# Patient Record
Sex: Male | Born: 1945
Health system: Southern US, Community
[De-identification: ages and names within clinical notes are randomized; demographics above are authoritative.]

## PROBLEM LIST (undated history)

## (undated) DIAGNOSIS — I480 Paroxysmal atrial fibrillation: Secondary | ICD-10-CM

## (undated) DIAGNOSIS — C801 Malignant (primary) neoplasm, unspecified: Secondary | ICD-10-CM

## (undated) DIAGNOSIS — M1990 Unspecified osteoarthritis, unspecified site: Secondary | ICD-10-CM

## (undated) DIAGNOSIS — I10 Essential (primary) hypertension: Secondary | ICD-10-CM

## (undated) DIAGNOSIS — F32A Depression, unspecified: Secondary | ICD-10-CM

## (undated) DIAGNOSIS — F329 Major depressive disorder, single episode, unspecified: Secondary | ICD-10-CM

## (undated) DIAGNOSIS — Z950 Presence of cardiac pacemaker: Secondary | ICD-10-CM

## (undated) DIAGNOSIS — I495 Sick sinus syndrome: Secondary | ICD-10-CM

## (undated) HISTORY — PX: TONSILLECTOMY: SUR1361

## (undated) HISTORY — DX: Paroxysmal atrial fibrillation: I48.0

## (undated) HISTORY — PX: FINGER SURGERY: SHX640

## (undated) HISTORY — DX: Presence of cardiac pacemaker: Z95.0

## (undated) HISTORY — DX: Essential (primary) hypertension: I10

## (undated) HISTORY — DX: Sick sinus syndrome: I49.5

---

## 1999-04-17 ENCOUNTER — Emergency Department (HOSPITAL_COMMUNITY): Admission: EM | Admit: 1999-04-17 | Discharge: 1999-04-17 | Payer: Self-pay | Admitting: Emergency Medicine

## 1999-04-17 ENCOUNTER — Encounter: Payer: Self-pay | Admitting: Emergency Medicine

## 1999-04-20 ENCOUNTER — Emergency Department (HOSPITAL_COMMUNITY): Admission: EM | Admit: 1999-04-20 | Discharge: 1999-04-20 | Payer: Self-pay | Admitting: Emergency Medicine

## 1999-04-27 ENCOUNTER — Emergency Department (HOSPITAL_COMMUNITY): Admission: EM | Admit: 1999-04-27 | Discharge: 1999-04-27 | Payer: Self-pay | Admitting: Emergency Medicine

## 2007-10-03 ENCOUNTER — Emergency Department (HOSPITAL_COMMUNITY): Admission: EM | Admit: 2007-10-03 | Discharge: 2007-10-03 | Payer: Self-pay | Admitting: Emergency Medicine

## 2008-04-07 ENCOUNTER — Ambulatory Visit (HOSPITAL_COMMUNITY): Payer: Self-pay | Admitting: Psychiatry

## 2008-04-21 ENCOUNTER — Ambulatory Visit (HOSPITAL_COMMUNITY): Payer: Self-pay | Admitting: Psychiatry

## 2009-03-23 ENCOUNTER — Inpatient Hospital Stay (HOSPITAL_COMMUNITY): Admission: RE | Admit: 2009-03-23 | Discharge: 2009-03-26 | Payer: Self-pay | Admitting: Orthopedic Surgery

## 2009-03-23 HISTORY — PX: REPLACEMENT TOTAL KNEE: SUR1224

## 2009-04-05 ENCOUNTER — Emergency Department (HOSPITAL_BASED_OUTPATIENT_CLINIC_OR_DEPARTMENT_OTHER): Admission: EM | Admit: 2009-04-05 | Discharge: 2009-04-05 | Payer: Self-pay | Admitting: Emergency Medicine

## 2009-04-05 ENCOUNTER — Ambulatory Visit: Payer: Self-pay | Admitting: Diagnostic Radiology

## 2009-04-06 ENCOUNTER — Inpatient Hospital Stay (HOSPITAL_COMMUNITY): Admission: EM | Admit: 2009-04-06 | Discharge: 2009-04-07 | Payer: Self-pay | Admitting: Emergency Medicine

## 2010-05-14 ENCOUNTER — Encounter
Admission: RE | Admit: 2010-05-14 | Discharge: 2010-05-14 | Payer: Self-pay | Source: Home / Self Care | Attending: Internal Medicine | Admitting: Internal Medicine

## 2010-09-01 LAB — CBC
MCHC: 34.5 g/dL (ref 30.0–36.0)
MCV: 95.6 fL (ref 78.0–100.0)
Platelets: 356 10*3/uL (ref 150–400)
Platelets: 400 10*3/uL (ref 150–400)
RBC: 3.29 MIL/uL — ABNORMAL LOW (ref 4.22–5.81)
RDW: 12.3 % (ref 11.5–15.5)
WBC: 9.5 10*3/uL (ref 4.0–10.5)
WBC: 12.5 10*3/uL — ABNORMAL HIGH (ref 4.0–10.5)

## 2010-09-01 LAB — SYNOVIAL CELL COUNT + DIFF, W/ CRYSTALS
Crystals, Fluid: NONE SEEN
Eosinophils-Synovial: 0 % (ref 0–1)
Lymphocytes-Synovial Fld: 8 % (ref 0–20)
Monocyte-Macrophage-Synovial Fluid: 9 % — ABNORMAL LOW (ref 50–90)
Neutrophil, Synovial: 83 % — ABNORMAL HIGH (ref 0–25)
Other Cells-SYN: 0
WBC, Synovial: 1155 /mm3 — ABNORMAL HIGH (ref 0–200)

## 2010-09-01 LAB — BODY FLUID CULTURE
Culture: NO GROWTH
Gram Stain: NONE SEEN

## 2010-09-01 LAB — DIFFERENTIAL
Eosinophils Relative: 1 % (ref 0–5)
Lymphocytes Relative: 4 % — ABNORMAL LOW (ref 12–46)
Lymphs Abs: 0.5 10*3/uL — ABNORMAL LOW (ref 0.7–4.0)
Monocytes Absolute: 0.4 10*3/uL (ref 0.1–1.0)
Monocytes Relative: 3 % (ref 3–12)
Neutro Abs: 11.3 10*3/uL — ABNORMAL HIGH (ref 1.7–7.7)

## 2010-09-01 LAB — URINALYSIS, ROUTINE W REFLEX MICROSCOPIC
Bilirubin Urine: NEGATIVE
Glucose, UA: NEGATIVE mg/dL
Hgb urine dipstick: NEGATIVE
Ketones, ur: NEGATIVE mg/dL
Nitrite: NEGATIVE
Specific Gravity, Urine: 1.017 (ref 1.005–1.030)
pH: 7 (ref 5.0–8.0)

## 2010-09-01 LAB — URINE CULTURE
Colony Count: NO GROWTH
Culture: NO GROWTH

## 2010-09-01 LAB — COMPREHENSIVE METABOLIC PANEL
AST: 23 U/L (ref 0–37)
Albumin: 3.7 g/dL (ref 3.5–5.2)
BUN: 26 mg/dL — ABNORMAL HIGH (ref 6–23)
Calcium: 8.7 mg/dL (ref 8.4–10.5)
Chloride: 97 mEq/L (ref 96–112)
Creatinine, Ser: 1.1 mg/dL (ref 0.4–1.5)
GFR calc Af Amer: >60 mL/min (ref >60)
Total Bilirubin: 0.8 mg/dL (ref 0.3–1.2)
Total Protein: 7.4 g/dL (ref 6.0–8.3)

## 2010-09-01 LAB — CULTURE, BLOOD (ROUTINE X 2)
Culture: NO GROWTH
Culture: NO GROWTH

## 2010-09-01 LAB — WOUND CULTURE: Gram Stain: NONE SEEN

## 2010-09-01 LAB — PROTIME-INR
INR: 1.54 — ABNORMAL HIGH (ref 0.00–1.49)
Prothrombin Time: 18.4 seconds — ABNORMAL HIGH (ref 11.6–15.2)
Prothrombin Time: 20.3 seconds — ABNORMAL HIGH (ref 11.6–15.2)

## 2010-09-01 LAB — LACTIC ACID, PLASMA: Lactic Acid, Venous: 0.9 mmol/L (ref 0.5–2.2)

## 2010-09-02 LAB — CBC
HCT: 29.5 % — ABNORMAL LOW (ref 39.0–52.0)
HCT: 45.1 % (ref 39.0–52.0)
Hemoglobin: 10.2 g/dL — ABNORMAL LOW (ref 13.0–17.0)
Hemoglobin: 12.5 g/dL — ABNORMAL LOW (ref 13.0–17.0)
MCHC: 34.2 g/dL (ref 30.0–36.0)
MCHC: 34.3 g/dL (ref 30.0–36.0)
MCHC: 34.5 g/dL (ref 30.0–36.0)
MCV: 96.3 fL (ref 78.0–100.0)
Platelets: 191 10*3/uL (ref 150–400)
Platelets: 202 10*3/uL (ref 150–400)
Platelets: 214 10*3/uL (ref 150–400)
RBC: 3.02 MIL/uL — ABNORMAL LOW (ref 4.22–5.81)
RDW: 12.6 % (ref 11.5–15.5)
RDW: 12.7 % (ref 11.5–15.5)
RDW: 12.8 % (ref 11.5–15.5)
RDW: 13 % (ref 11.5–15.5)
WBC: 7 10*3/uL (ref 4.0–10.5)

## 2010-09-02 LAB — BASIC METABOLIC PANEL
BUN: 11 mg/dL (ref 6–23)
BUN: 22 mg/dL (ref 6–23)
BUN: 25 mg/dL — ABNORMAL HIGH (ref 6–23)
CO2: 29 mEq/L (ref 19–32)
CO2: 30 mEq/L (ref 19–32)
CO2: 34 mEq/L — ABNORMAL HIGH (ref 19–32)
Calcium: 7.8 mg/dL — ABNORMAL LOW (ref 8.4–10.5)
Calcium: 7.8 mg/dL — ABNORMAL LOW (ref 8.4–10.5)
Calcium: 8 mg/dL — ABNORMAL LOW (ref 8.4–10.5)
Chloride: 96 mEq/L (ref 96–112)
Chloride: 98 mEq/L (ref 96–112)
Creatinine, Ser: 1.22 mg/dL (ref 0.4–1.5)
Creatinine, Ser: 2.05 mg/dL — ABNORMAL HIGH (ref 0.4–1.5)
Creatinine, Ser: 2.18 mg/dL — ABNORMAL HIGH (ref 0.4–1.5)
GFR calc Af Amer: >60 mL/min (ref >60)
GFR calc non Af Amer: 31 mL/min — ABNORMAL LOW (ref >60)
GFR calc non Af Amer: 60 mL/min — ABNORMAL LOW (ref >60)
GFR calc non Af Amer: >60 mL/min (ref >60)
Glucose, Bld: 126 mg/dL — ABNORMAL HIGH (ref 70–99)
Glucose, Bld: 152 mg/dL — ABNORMAL HIGH (ref 70–99)
Potassium: 3.3 mEq/L — ABNORMAL LOW (ref 3.5–5.1)
Sodium: 135 mEq/L (ref 135–145)

## 2010-09-02 LAB — URINALYSIS, ROUTINE W REFLEX MICROSCOPIC
Bilirubin Urine: NEGATIVE
Ketones, ur: NEGATIVE mg/dL
Nitrite: NEGATIVE
Protein, ur: NEGATIVE mg/dL
Urobilinogen, UA: 0.2 mg/dL (ref 0.0–1.0)

## 2010-09-02 LAB — APTT: aPTT: 29 seconds (ref 24–37)

## 2010-09-02 LAB — PROTIME-INR
INR: 0.98 (ref 0.00–1.49)
INR: 1.25 (ref 0.00–1.49)
Prothrombin Time: 15.6 seconds — ABNORMAL HIGH (ref 11.6–15.2)
Prothrombin Time: 22.1 seconds — ABNORMAL HIGH (ref 11.6–15.2)

## 2010-09-02 LAB — COMPREHENSIVE METABOLIC PANEL
AST: 21 U/L (ref 0–37)
Albumin: 3.8 g/dL (ref 3.5–5.2)
BUN: 11 mg/dL (ref 6–23)
Chloride: 99 mEq/L (ref 96–112)
Creatinine, Ser: 0.78 mg/dL (ref 0.4–1.5)
GFR calc Af Amer: >60 mL/min (ref >60)
Potassium: 3.9 mEq/L (ref 3.5–5.1)
Total Protein: 8 g/dL (ref 6.0–8.3)

## 2010-09-02 LAB — TYPE AND SCREEN: Antibody Screen: NEGATIVE

## 2011-11-01 HISTORY — PX: US ECHOCARDIOGRAPHY: HXRAD669

## 2011-11-11 ENCOUNTER — Other Ambulatory Visit: Payer: Self-pay | Admitting: Cardiovascular Disease

## 2011-11-28 DIAGNOSIS — Z950 Presence of cardiac pacemaker: Secondary | ICD-10-CM

## 2011-11-28 HISTORY — DX: Presence of cardiac pacemaker: Z95.0

## 2011-12-04 MED ORDER — SODIUM CHLORIDE 0.9 % IR SOLN
80.0000 mg | Status: DC
  Filled 2011-12-04: qty 2

## 2011-12-04 MED ORDER — VANCOMYCIN HCL 1000 MG IV SOLR
1500.0000 mg | INTRAVENOUS | Status: DC
  Filled 2011-12-04 (×2): qty 1500

## 2011-12-04 MED ORDER — SODIUM CHLORIDE 0.45 % IV SOLN
INTRAVENOUS | Status: DC
  Administered 2011-12-05: 08:00:00 via INTRAVENOUS

## 2011-12-04 MED ORDER — SODIUM CHLORIDE 0.9 % IJ SOLN: 3.0000 mL | INTRAMUSCULAR | Status: DC | PRN

## 2011-12-05 ENCOUNTER — Ambulatory Visit (HOSPITAL_COMMUNITY)
Admission: RE | Admit: 2011-12-05 | Discharge: 2011-12-06 | Disposition: A | Discharge status: Home / Self Care | Payer: Medicare Other | Source: Ambulatory Visit | Attending: Cardiovascular Disease | Admitting: Cardiovascular Disease

## 2011-12-05 ENCOUNTER — Ambulatory Visit (HOSPITAL_COMMUNITY): Payer: Medicare Other

## 2011-12-05 ENCOUNTER — Encounter (HOSPITAL_COMMUNITY): Payer: Self-pay | Admitting: Cardiology

## 2011-12-05 ENCOUNTER — Encounter (HOSPITAL_COMMUNITY)
Admission: RE | Disposition: A | Discharge status: Home / Self Care | Payer: Self-pay | Source: Ambulatory Visit | Attending: Cardiovascular Disease

## 2011-12-05 DIAGNOSIS — R001 Bradycardia, unspecified: Secondary | ICD-10-CM | POA: Diagnosis present

## 2011-12-05 DIAGNOSIS — I10 Essential (primary) hypertension: Secondary | ICD-10-CM | POA: Diagnosis present

## 2011-12-05 DIAGNOSIS — I495 Sick sinus syndrome: Secondary | ICD-10-CM | POA: Insufficient documentation

## 2011-12-05 DIAGNOSIS — Z950 Presence of cardiac pacemaker: Secondary | ICD-10-CM | POA: Diagnosis present

## 2011-12-05 DIAGNOSIS — T82191A Other mechanical complication of cardiac pulse generator (battery), initial encounter: Secondary | ICD-10-CM | POA: Diagnosis present

## 2011-12-05 HISTORY — PX: PERMANENT PACEMAKER INSERTION: SHX6023

## 2011-12-05 HISTORY — PX: PERMANENT PACEMAKER INSERTION: SHX5480

## 2011-12-05 SURGERY — PERMANENT PACEMAKER INSERTION
Anesthesia: LOCAL

## 2011-12-05 MED ORDER — BENAZEPRIL HCL 20 MG PO TABS
20.0000 mg | ORAL_TABLET | Freq: Every day | ORAL | Status: DC
  Administered 2011-12-06: 20 mg via ORAL
  Filled 2011-12-05: qty 1

## 2011-12-05 MED ORDER — VANCOMYCIN HCL IN DEXTROSE 1-5 GM/200ML-% IV SOLN
1000.0000 mg | Freq: Two times a day (BID) | INTRAVENOUS | Status: AC
Start: 2011-12-05 — End: 2011-12-05
  Administered 2011-12-05: 1000 mg via INTRAVENOUS
  Filled 2011-12-05: qty 200

## 2011-12-05 MED ORDER — LIDOCAINE HCL (PF) 1 % IJ SOLN
INTRAMUSCULAR | Status: AC
  Filled 2011-12-05: qty 60

## 2011-12-05 MED ORDER — MIDAZOLAM HCL 2 MG/2ML IJ SOLN
INTRAMUSCULAR | Status: AC
  Filled 2011-12-05: qty 2

## 2011-12-05 MED ORDER — ALPRAZOLAM 0.25 MG PO TABS: 0.2500 mg | ORAL_TABLET | Freq: Three times a day (TID) | ORAL | Status: DC | PRN

## 2011-12-05 MED ORDER — ADULT MULTIVITAMIN W/MINERALS CH
1.0000 | ORAL_TABLET | Freq: Every day | ORAL | Status: DC
  Administered 2011-12-06: 1 via ORAL
  Filled 2011-12-05: qty 1

## 2011-12-05 MED ORDER — ONDANSETRON HCL 4 MG/2ML IJ SOLN: 4.0000 mg | Freq: Four times a day (QID) | INTRAMUSCULAR | Status: DC | PRN

## 2011-12-05 MED ORDER — TRAMADOL HCL 50 MG PO TABS: 50.0000 mg | ORAL_TABLET | Freq: Four times a day (QID) | ORAL | Status: DC | PRN

## 2011-12-05 MED ORDER — CHLORHEXIDINE GLUCONATE 4 % EX LIQD
60.0000 mL | Freq: Once | CUTANEOUS | Status: DC
  Filled 2011-12-05: qty 60

## 2011-12-05 MED ORDER — ACETAMINOPHEN 325 MG PO TABS: 325.0000 mg | ORAL_TABLET | ORAL | Status: DC | PRN

## 2011-12-05 MED ORDER — ZOLPIDEM TARTRATE 5 MG PO TABS: 5.0000 mg | ORAL_TABLET | Freq: Every evening | ORAL | Status: DC | PRN

## 2011-12-05 MED ORDER — HYDROCHLOROTHIAZIDE 25 MG PO TABS
25.0000 mg | ORAL_TABLET | Freq: Every day | ORAL | Status: DC
  Administered 2011-12-06: 25 mg via ORAL
  Filled 2011-12-05: qty 1

## 2011-12-05 MED ORDER — MUPIROCIN 2 % EX OINT
TOPICAL_OINTMENT | Freq: Two times a day (BID) | CUTANEOUS | Status: DC
  Administered 2011-12-05 (×2): via NASAL
  Filled 2011-12-05: qty 22

## 2011-12-05 MED ORDER — FENTANYL CITRATE 0.05 MG/ML IJ SOLN
INTRAMUSCULAR | Status: AC
  Filled 2011-12-05: qty 2

## 2011-12-05 MED ORDER — SERTRALINE HCL 50 MG PO TABS
150.0000 mg | ORAL_TABLET | Freq: Every day | ORAL | Status: DC
  Administered 2011-12-06: 150 mg via ORAL
  Filled 2011-12-05: qty 1

## 2011-12-05 MED ORDER — MUPIROCIN 2 % EX OINT
TOPICAL_OINTMENT | CUTANEOUS | Status: AC
  Filled 2011-12-05: qty 22

## 2011-12-05 MED ORDER — HYDROCODONE-ACETAMINOPHEN 5-325 MG PO TABS
1.0000 | ORAL_TABLET | ORAL | Status: DC | PRN
  Administered 2011-12-05: 2 via ORAL
  Administered 2011-12-05: 1 via ORAL
  Administered 2011-12-06: 2 via ORAL
  Filled 2011-12-05: qty 1
  Filled 2011-12-05 (×2): qty 2

## 2011-12-05 MED ORDER — BUPROPION HCL ER (SR) 150 MG PO TB12
150.0000 mg | ORAL_TABLET | Freq: Two times a day (BID) | ORAL | Status: DC
  Administered 2011-12-05 – 2011-12-06 (×2): 150 mg via ORAL
  Filled 2011-12-05 (×3): qty 1

## 2011-12-05 MED ORDER — HEPARIN (PORCINE) IN NACL 2-0.9 UNIT/ML-% IJ SOLN
INTRAMUSCULAR | Status: AC
  Filled 2011-12-05: qty 1000

## 2011-12-05 MED ORDER — VANCOMYCIN HCL IN DEXTROSE 1-5 GM/200ML-% IV SOLN
INTRAVENOUS | Status: AC
  Filled 2011-12-05: qty 200

## 2011-12-05 NOTE — Progress Notes (Signed)
Received patient with no VTE order. Called MD for clarification but didn't received call back. Patient will go home tomorrow. Will continue to follow.

## 2011-12-05 NOTE — CV Procedure (Signed)
Piggott,George Wolf Male, 66 y.o., [birth year]  Location: [facility] Bed: NONE  MRN: [id number]  CSN: [id number]   Procedure report  Procedure performed: 1. Implantation of new dual chamber permanent pacemaker 2. Fluoroscopy 3.  Light sedation  Reason for procedure: Symptomatic bradycardia due to: Sinus node dysfunction Chronotropic incompetence  Procedure performed by: Thurmon Fair, MD  Complications: None  Estimated blood loss: <10 mL  Medications administered during procedure: Vancomycin 1 g intravenously Lidocaine 1% 30 mL locally,  Fentanyl 50 mcg intravenously Versed 3 mg intravenously  Device details:  [employer] model Advantio  [id number] serial [phone] Right atrial lead guidant dextrus 4136 serial number [id number] Right ventricular lead guidant dextrus 4137 serial number [id number]  Procedure details:  After the risks and benefits of the procedure were discussed the patient provided informed consent and was brought to the cardiac cath lab in the fasting state. The patient was prepped and draped in usual sterile fashion. Local anesthesia with 1% lidocaine was administered to to the left infraclavicular area. A 5-6 cm horizontal incision was made parallel with and 2-3 cm caudal to the left clavicle. Using electrocautery and blunt dissection a prepectoral pocket was created down to the level of the pectoralis major muscle fascia. The pocket was carefully inspected for hemostasis. An antibiotic-soaked sponge was placed in the pocket.  Under fluoroscopic guidance and using the modified Seldinger technique 2 separate venipunctures were performed to access the left subclavian vein. no difficulty was encountered accessing the vein.  Two Wolf-tip guidewires were subsequently exchanged for two 7 French safe sheaths.  Under fluoroscopic guidance the ventricular lead was advanced to level of the mid to apical right ventricular septum and thet active-fixation helix was  deployed. 3 different locations were used until good sensing pacing and current of injury were noted. Prominent current of injury was seen. There was no evidence of diaphragmatic stimulation at maximum device output. The safe sheath was peeled away and the lead was secured in place with 2-0 silk.  In similar fashion the right atrial lead was advanced to the level of the atrial appendage. The active-fixation helix was deployed. There was prominent current of injury. Satisfactory  pacing and sensing parameters were recorded. There was no evidence of diaphragmatic stimulation with pacing at maximum device output. The safe sheath was peeled away and the lead was secured in place with 2-0 silk.  The antibiotic-soaked sponge was removed from the pocket. The pocket was flushed with copious amounts of antibiotic solution. Reinspection showed excellent hemostasis..  The ventricular lead was connected to the generator and appropriate ventricular pacing was seen. Subsequently the atrial lead was also connected. Repeat testing of the lead parameters later showed excellent values.  The entire system was then carefully inserted in the pocket with care been taking that the leads and device assumed a comfortable position without pressure on the incision. Great care was taken that the leads be located deep to the generator. The pocket was then closed in layers using 2 layers of 2-0 Vicryl and cutaneous staples, after which a sterile dressing was applied.  At the end of the procedure the following lead parameters were encountered:  Right atrial lead  sensed P waves 1.7 mV, impedance 628 ohms, threshold 0.6 V at 0.4 ms pulse width.  Right ventricular lead sensed R waves 11.1 mV, impedance 774 ohms, threshold 0.6 V at 0.4 ms pulse width.   Thurmon Fair, MD, [facility] [phone] office [phone] pager 12/05/2011 11:10 AM  Cc: Jolene Provost, MD

## 2011-12-05 NOTE — H&P (Signed)
  Date of Initial H&P: 11/21/11  History reviewed, patient examined, no change in status, stable for surgery. Symptomatic bradycardia/chronotropic incompetence. No known structural heart disease. Here for dual chamber PPM. This procedure has been fully reviewed with the patient and written informed consent has been obtained. Thurmon Fair, MD, [facility] [phone] office [phone] pager 12/05/2011 9:23 AM

## 2011-12-06 ENCOUNTER — Ambulatory Visit (HOSPITAL_COMMUNITY): Payer: Medicare Other

## 2011-12-06 DIAGNOSIS — I10 Essential (primary) hypertension: Secondary | ICD-10-CM | POA: Diagnosis present

## 2011-12-06 DIAGNOSIS — Z950 Presence of cardiac pacemaker: Secondary | ICD-10-CM | POA: Diagnosis present

## 2011-12-06 DIAGNOSIS — R001 Bradycardia, unspecified: Secondary | ICD-10-CM | POA: Diagnosis present

## 2011-12-06 DIAGNOSIS — T82191A Other mechanical complication of cardiac pulse generator (battery), initial encounter: Secondary | ICD-10-CM | POA: Diagnosis present

## 2011-12-06 MED ORDER — ACETAMINOPHEN 325 MG PO TABS: 325.0000 mg | ORAL_TABLET | ORAL | Status: AC | PRN

## 2011-12-06 NOTE — Progress Notes (Signed)
Orthopedic Tech Progress Note Patient Details:  George Wolf [birth year] [id number]  Patient ID: George Wolf, male   DOB: [birth year], 66 y.o.   MRN: [id number]   George Wolf 12/06/2011, 10:05 AM PT HAS ARM SLING ON

## 2011-12-06 NOTE — Progress Notes (Signed)
Pt/family given discharge instructions, medication lists, follow up appointments, and when to call the doctor. Pt/wife given instructions for left arm limitations. Pt given signs and symptoms of infection. Pt/family verbalizes understanding. George Wolf

## 2011-12-06 NOTE — Progress Notes (Signed)
Subjective:  No complaints  Objective:  Vital Signs in the last 24 hours: Temp:  [97.1 F (36.2 C)-98.4 F (36.9 C)] 97.4 F (36.3 C) (07/09 0448) Pulse Rate:  [60-80] 66  (07/09 0448) Resp:  [16-18] 18  (07/09 0448) BP: (104-147)/(69-88) 128/82 mmHg (07/09 0652) SpO2:  [91 %-96 %] 94 % (07/09 0448)  Intake/Output from previous day:  Intake/Output Summary (Last 24 hours) at 12/06/11 0842 Last data filed at 12/06/11 0300  Gross per 24 hour  Intake      0 ml  Output   1500 ml  Net  -1500 ml    Physical Exam: General appearance: alert, cooperative and no distress Lungs: clear to auscultation bilaterally Heart: regular rate and rhythm Pacer site without hematoma   Rate: 66  Rhythm: A paced  Lab Results: No results found for this basename: WBC:2,HGB:2,PLT:2 in the last 72 hours No results found for this basename: NA:2,K:2,CL:2,CO2:2,GLUCOSE:2,BUN:2,CREATININE:2 in the last 72 hours No results found for this basename: TROPONINI:2,CK,MB:2 in the last 72 hours Hepatic Function Panel No results found for this basename: PROT,ALBUMIN,AST,ALT,ALKPHOS,BILITOT,BILIDIR,IBILI in the last 72 hours No results found for this basename: CHOL in the last 72 hours No results found for this basename: INR in the last 72 hours  Imaging: Dg Chest 2 View  12/06/2011  *RADIOLOGY REPORT*  Clinical Data: Pacemaker implantation.  CHEST - 2 VIEW  Comparison: 12/05/2011.  Findings: New dual lead left subclavian cardiac pacemaker.  Right atrial appendage and right ventricular apex leads appear in good position.  There is no pneumothorax.  Cardiopericardial silhouette appears within normal limits.  IMPRESSION: Uncomplicated new left subclavian dual lead cardiac pacemaker.  Original Report Authenticated By: Andreas Newport, M.D.   Dg Chest 2 View  12/05/2011  *RADIOLOGY REPORT*  Clinical Data: Preoperative respiratory exam.  Arrhythmia.  For pacemaker insertion.  Sick sinus syndrome.  CHEST - 2 VIEW   Comparison: 04/05/2009  Findings: Heart size and pulmonary vascularity are normal and the lungs are clear.  No osseous abnormality of significance.  IMPRESSION: No acute abnormality.  Original Report Authenticated By: Gwynn Burly, M.D.    Cardiac Studies:  Assessment/Plan:   Principal Problem:  *Bradycardia, symptomatic Active Problems:  Pacemaker, BS 12/05/11  HTN (hypertension), NL LVF by echo   Plan- discharge   Corine Shelter PA-C 12/06/2011, 8:42 AM    Agree with note written by Corine Shelter PAC  S/P PTVPPM yesterday by Dr. Salena Saner. Site OK. Interrogated this am. CXR ok. Plan D/C home. ROV with Dr. Julieanne Cotton J 12/06/2011 8:46 AM

## 2011-12-06 NOTE — Discharge Summary (Signed)
Patient ID: George Wolf,  MRN: [id number], DOB/AGE: Jan 01, 1946 66 y.o.  Admit date: 12/05/2011 Discharge date: 12/06/2011  Primary Care Provider:  Primary Cardiologist: Dr Royann Shivers  Discharge Diagnoses Principal Problem:  *Bradycardia, symptomatic Active Problems:  Pacemaker, BS 12/05/11  HTN (hypertension), NL LVF by echo    Procedures: BS PTVDP 12/05/11   Hospital Course: 66 y/o with no prior history of CAD or structural heart disease, admitted for elective pacemaker implant for chronotropic incompetence and bradycardia. See Dr 's office note for complete details. Pt tolerated procedure well.   Discharge Vitals:  Blood pressure 128/82, pulse 66, temperature 97.4 F (36.3 C), temperature source Oral, resp. rate 18, height 6' (1.829 m), weight 114.76 kg (253 lb), SpO2 94.00%.    Labs: Results for orders placed during the hospital encounter of 12/05/11 (from the past 48 hour(s))  SURGICAL PCR SCREEN     Status: Normal   Collection Time   12/05/11  8:22 AM      Component Value Range Comment   MRSA, PCR NEGATIVE  NEGATIVE    Staphylococcus aureus NEGATIVE  NEGATIVE     Disposition:  Follow-up Information    Follow up with Thurmon Fair, MD. (keep your appointment on the 18th)    Contact information:   [address] [place] [address] [phone]          Discharge Medications:  Medication List  As of 12/06/2011 11:45 AM   TAKE these medications         acetaminophen 325 MG tablet   Commonly known as: TYLENOL   Take 1-2 tablets (325-650 mg total) by mouth every 4 (four) hours as needed.      benazepril 20 MG tablet   Commonly known as: LOTENSIN   Take 20 mg by mouth daily.      buPROPion 150 MG 12 hr tablet   Commonly known as: WELLBUTRIN SR   Take 150 mg by mouth 2 (two) times daily.      fexofenadine 180 MG tablet   Commonly known as: ALLEGRA   Take 180 mg by mouth daily.      fish oil-omega-3 fatty acids 1000 MG capsule    Take 1 g by mouth daily.      hydrochlorothiazide 25 MG tablet   Commonly known as: HYDRODIURIL   Take 25 mg by mouth daily.      magnesium oxide 400 MG tablet   Commonly known as: MAG-OX   Take 400 mg by mouth daily.      multivitamin with minerals Tabs   Take 1 tablet by mouth daily.      sertraline 100 MG tablet   Commonly known as: ZOLOFT   Take 150 mg by mouth daily.              Duration of Discharge Encounter: Greater than 30 minutes including physician time.  Jolene Provost PA-C 12/06/2011 11:45 AM

## 2012-11-12 ENCOUNTER — Other Ambulatory Visit: Payer: Self-pay | Admitting: Cardiovascular Disease

## 2012-11-12 DIAGNOSIS — I495 Sick sinus syndrome: Secondary | ICD-10-CM

## 2012-11-28 LAB — REMOTE PACEMAKER DEVICE
AL AMPLITUDE: 2.6 mv
AL IMPEDENCE PM: 605 Ohm
BAMS-0001: 150 {beats}/min
RV LEAD IMPEDENCE PM: 603 Ohm

## 2012-12-14 ENCOUNTER — Encounter: Payer: Self-pay | Admitting: Cardiovascular Disease

## 2013-01-14 ENCOUNTER — Other Ambulatory Visit: Payer: Self-pay | Admitting: Cardiovascular Disease

## 2013-01-18 ENCOUNTER — Encounter: Payer: Self-pay | Admitting: *Deleted

## 2013-01-18 LAB — REMOTE PACEMAKER DEVICE
AL IMPEDENCE PM: 613 Ohm
ATRIAL PACING PM: 88
BAMS-0001: 150 {beats}/min
DEVICE MODEL PM: 112155

## 2013-01-23 ENCOUNTER — Encounter: Payer: Self-pay | Admitting: Cardiovascular Disease

## 2013-01-23 ENCOUNTER — Encounter (HOSPITAL_COMMUNITY): Payer: Self-pay | Admitting: Cardiovascular Disease

## 2013-01-23 ENCOUNTER — Ambulatory Visit (INDEPENDENT_AMBULATORY_CARE_PROVIDER_SITE_OTHER): Payer: Medicare Other | Admitting: Cardiovascular Disease

## 2013-01-23 VITALS — BP 128/80 | HR 67 | Ht 72.0 in | Wt 252.1 lb

## 2013-01-23 DIAGNOSIS — R0609 Other forms of dyspnea: Secondary | ICD-10-CM

## 2013-01-23 DIAGNOSIS — I10 Essential (primary) hypertension: Secondary | ICD-10-CM

## 2013-01-23 DIAGNOSIS — I472 Ventricular tachycardia: Secondary | ICD-10-CM | POA: Insufficient documentation

## 2013-01-23 DIAGNOSIS — R0600 Dyspnea, unspecified: Secondary | ICD-10-CM

## 2013-01-23 DIAGNOSIS — Z950 Presence of cardiac pacemaker: Secondary | ICD-10-CM

## 2013-01-23 NOTE — Patient Instructions (Addendum)
Your physician has requested that you have a lexiscan myoview. For further information please visit [email or web address] Please follow instruction sheet, as given.  We will call you with the results.  Your physician recommends that you schedule a follow-up appointment in: One Year

## 2013-01-23 NOTE — Assessment & Plan Note (Signed)
Well-controlled. No recent change in medications. Consider switching ACE inhibitor to a beta blocker

## 2013-01-23 NOTE — Assessment & Plan Note (Signed)
Otherwise normal device function. Virtually no ventricular pacing. 90% atrial pacing. Normal battery and lead parameters.

## 2013-01-23 NOTE — Assessment & Plan Note (Signed)
Recommend workup for coronary insufficiency. Unfortunately activities limited by his orthopedic problems. Unlikely to achieve significant increase in cardiac workload with treadmill exercise because of sinus node dysfunction. Recommend lexiscan myoview to evaluate both the residual dyspnea and the ventricular tachycardia.

## 2013-01-23 NOTE — Progress Notes (Signed)
Patient ID: George Wolf, male   DOB: March 03, 1946, 67 y.o.   MRN: [id number]     Reason for office visit Ventricular tachycardia  Mr. Rieves had a dual-chamber permanent pacemaker implanted roughly one year ago for sinus node dysfunction. His pacemaker has recorded several episodes of asymptomatic nonsustained ventricular tachycardia, including an episode that approached 10 seconds in duration. Prior to implantation of the device, an echocardiogram did not show any evidence of significant myocardial or valvular abnormalities. He has long-standing systemic hypertension that has currently been well treated. He has no complaints other than some low back discomfort and knee pain. He does not have a history of coronary disease, but has never undergone workup either. He is unable to achieve rapid heart rates during exercise because of his sinus node abnormalities as well as previous right knee surgery. He does have exertional dyspnea, although this is partly relieved when we increased his rate response sensor settings.    Allergies  Allergen Reactions  . Penicillins Hives    Current Outpatient Prescriptions  Medication Sig Dispense Refill  . benazepril (LOTENSIN) 20 MG tablet Take 20 mg by mouth daily.      Marland Kitchen buPROPion (WELLBUTRIN SR) 150 MG 12 hr tablet Take 150 mg by mouth 2 (two) times daily.      . fexofenadine (ALLEGRA) 180 MG tablet Take 180 mg by mouth daily as needed.       . fish oil-omega-3 fatty acids 1000 MG capsule Take 1 g by mouth daily.      . hydrochlorothiazide (HYDRODIURIL) 25 MG tablet Take 25 mg by mouth daily.      . magnesium oxide (MAG-OX) 400 MG tablet Take 400 mg by mouth daily.      . Multiple Vitamin (MULTIVITAMIN WITH MINERALS) TABS Take 1 tablet by mouth daily.      . sertraline (ZOLOFT) 100 MG tablet Take 150 mg by mouth daily.       . clonazePAM (KLONOPIN) 0.5 MG tablet Take 0.5 mg by mouth daily as needed.       No current facility-administered medications for  this visit.    Past Medical History  Diagnosis Date  . Sinus node dysfunction     [employer] pacemaker 12/05/11  . Hypertension     Past Surgical History  Procedure Laterality Date  . Permanent pacemaker insertion  12/05/2011    [employer]  . Replacement total knee  03/23/2009    right  . US echocardiography  11/01/2011    mildly dilated LA    Family History  Problem Relation Age of Onset  . Diabetes Father     History   Social History  . Marital Status: Married    Spouse Name: N/A    Number of Children: N/A  . Years of Education: N/A   Occupational History  . Not on file.   Social History Main Topics  . Smoking status: Never Smoker   . Smokeless tobacco: Never Used  . Alcohol Use: Yes  . Drug Use: No  . Sexual Activity: Yes    Partners: Female   Other Topics Concern  . Not on file   Social History Narrative  . No narrative on file    Review of systems: The patient specifically denies any chest pain at rest or with exertion, dyspnea at rest, orthopnea, paroxysmal nocturnal dyspnea, syncope, palpitations, focal neurological deficits, intermittent claudication, lower extremity edema, unexplained weight gain, cough, hemoptysis or wheezing.  The patient also denies abdominal pain,  nausea, vomiting, dysphagia, diarrhea, constipation, polyuria, polydipsia, dysuria, hematuria, frequency, urgency, abnormal bleeding or bruising, fever, chills, unexpected weight changes, mood swings, change in skin or hair texture, change in voice quality, auditory or visual problems, allergic reactions or rashes, new musculoskeletal complaints other than usual "aches and pains".   PHYSICAL EXAM BP 128/80  Pulse 67  Ht 6' (1.829 m)  Wt 252 lb 1.6 oz (114.352 kg)  BMI 34.18 kg/m2  General: Alert, oriented x3, no distress Head: no evidence of trauma, PERRL, EOMI, no exophtalmos or lid lag, no myxedema, no xanthelasma; normal ears, nose and oropharynx Neck: normal jugular  venous pulsations and no hepatojugular reflux; brisk carotid pulses without delay and no carotid bruits Chest: clear to auscultation, no signs of consolidation by percussion or palpation, normal fremitus, symmetrical and full respiratory excursions; healthy left subclavian pacemaker site Cardiovascular: normal position and quality of the apical impulse, regular rhythm, normal first and second heart sounds, no murmurs, rubs or gallops Abdomen: no tenderness or distention, no masses by palpation, no abnormal pulsatility or arterial bruits, normal bowel sounds, no hepatosplenomegaly Extremities: no clubbing, cyanosis or edema; 2+ radial, ulnar and brachial pulses bilaterally; 2+ right femoral, posterior tibial and dorsalis pedis pulses; 2+ left femoral, posterior tibial and dorsalis pedis pulses; no subclavian or femoral bruits, scar previous right knee surgery Neurological: grossly nonfocal   EKG: Atrial paced ventricular sensed  Lipid Panel  No results found for this basename: chol, trig, hdl, cholhdl, vldl, ldlcalc    BMET    Component Value Date/Time   NA 134* 04/05/2009 1915   K 3.6 04/05/2009 1915   CL 97 04/05/2009 1915   CO2 26 04/05/2009 1915   GLUCOSE 120* 04/05/2009 1915   BUN 26* 04/05/2009 1915   CREATININE 1.1 04/05/2009 1915   CALCIUM 8.7 04/05/2009 1915   GFRNONAA >60 04/05/2009 1915   GFRAA  Value: >60        The eGFR has been calculated using the MDRD equation. This calculation has not been validated in all clinical situations. eGFR's persistently <60 mL/min signify possible Chronic Kidney Disease. 04/05/2009 1915     ASSESSMENT AND PLAN NSVT (nonsustained ventricular tachycardia) Recommend workup for coronary insufficiency. Unfortunately activities limited by his orthopedic problems. Unlikely to achieve significant increase in cardiac workload with treadmill exercise because of sinus node dysfunction. Recommend lexiscan myoview to evaluate both the residual dyspnea and the  ventricular tachycardia.  Pacemaker, dual-chamber [employer], sinus node dysfunction, implanted 12/05/11 Otherwise normal device function. Virtually no ventricular pacing. 90% atrial pacing. Normal battery and lead parameters.  HTN (hypertension), NL LVF by echo Well-controlled. No recent change in medications. Consider switching ACE inhibitor to a beta blocker   Orders Placed This Encounter  Procedures  . Myocardial Perfusion Imaging  . EKG 12-Lead   Meds ordered this encounter  Medications  . clonazePAM (KLONOPIN) 0.5 MG tablet    Sig: Take 0.5 mg by mouth daily as needed.    Junious Silk, MD, [facility] [phone] office [phone] pager

## 2013-01-30 ENCOUNTER — Ambulatory Visit (HOSPITAL_COMMUNITY)
Admission: RE | Admit: 2013-01-30 | Discharge: 2013-01-30 | Disposition: A | Discharge status: Home / Self Care | Payer: Medicare Other | Source: Ambulatory Visit | Attending: Cardiovascular Disease | Admitting: Cardiovascular Disease

## 2013-01-30 DIAGNOSIS — I472 Ventricular tachycardia: Secondary | ICD-10-CM

## 2013-01-30 DIAGNOSIS — E663 Overweight: Secondary | ICD-10-CM | POA: Insufficient documentation

## 2013-01-30 DIAGNOSIS — R0989 Other specified symptoms and signs involving the circulatory and respiratory systems: Secondary | ICD-10-CM | POA: Insufficient documentation

## 2013-01-30 DIAGNOSIS — R0600 Dyspnea, unspecified: Secondary | ICD-10-CM

## 2013-01-30 DIAGNOSIS — I10 Essential (primary) hypertension: Secondary | ICD-10-CM | POA: Insufficient documentation

## 2013-01-30 DIAGNOSIS — I498 Other specified cardiac arrhythmias: Secondary | ICD-10-CM | POA: Insufficient documentation

## 2013-01-30 DIAGNOSIS — R0609 Other forms of dyspnea: Secondary | ICD-10-CM | POA: Insufficient documentation

## 2013-01-30 MED ORDER — TECHNETIUM TC 99M SESTAMIBI GENERIC - CARDIOLITE
10.9000 | Freq: Once | INTRAVENOUS | Status: AC | PRN
  Administered 2013-01-30: 10.9 via INTRAVENOUS

## 2013-01-30 MED ORDER — REGADENOSON 0.4 MG/5ML IV SOLN
0.4000 mg | Freq: Once | INTRAVENOUS | Status: AC
  Administered 2013-01-30: 0.4 mg via INTRAVENOUS

## 2013-01-30 MED ORDER — TECHNETIUM TC 99M SESTAMIBI GENERIC - CARDIOLITE
32.0000 | Freq: Once | INTRAVENOUS | Status: AC | PRN
  Administered 2013-01-30: 32 via INTRAVENOUS

## 2013-01-30 NOTE — Procedures (Addendum)
[facility] [address] [place] [address] [place] [address] [phone]  Cardiology Nuclear Med Study  George Wolf is a 67 y.o. male     MRN : [id number]     DOB: 11/10/1945  Procedure Date: 01/30/2013  Nuclear Med Background Indication for Stress Test:  Evaluation for Ischemia and Abnormal EKG History:  PACER--12/05/2011;SINUS NODE DYSFUNCTION Cardiac Risk Factors: Hypertension, Overweight and BRADYCARDIA;NSVT  Symptoms:  DOE and Fatigue   Nuclear Pre-Procedure Caffeine/Decaff Intake:  7:00pm NPO After: 5:00am   IV Site: R Hand  IV 0.9% NS with Angio Cath:  22g  Chest Size (in):  44"  IV Started by: Emmit Pomfret, RN  Height: 6' (1.829 m)  Cup Size: n/a  BMI:  Body mass index is 34.17 kg/(m^2). Weight:  252 lb (114.306 kg)   Tech Comments:  N/A    Nuclear Med Study 1 or 2 day study: 1 day  Stress Test Type:  Lexiscan  Order Authorizing Provider:   ,MD   Resting Radionuclide: Technetium 74m Sestamibi  Resting Radionuclide Dose: 10.9 mCi   Stress Radionuclide:  Technetium 33m Sestamibi  Stress Radionuclide Dose: 32.0 mCi           Stress Protocol Rest HR:64 Stress HR:89  Rest BP:139/90 Stress BP: 125/80  Exercise Time (min): n/a METS: n/a          Dose of Adenosine (mg):  n/a Dose of Lexiscan: 0.4 mg  Dose of Atropine (mg): n/a Dose of Dobutamine: n/a mcg/kg/min (at max HR)  Stress Test Technologist: Ernestene Mention, CCT Nuclear Technologist: Gonzella Lex, CNMT   Rest Procedure:  Myocardial perfusion imaging was performed at rest 45 minutes following the intravenous administration of Technetium 8m Sestamibi. Stress Procedure:  The patient received IV Lexiscan 0.4 mg over 15-seconds.  Technetium 35m Sestamibi injected at 30-seconds.  There were no significant changes with Lexiscan.  Quantitative spect images were obtained after a 45 minute delay.  Transient Ischemic Dilatation (Normal <1.22):   1.10 Lung/Heart Ratio (Normal <0.45):  0.25 QGS EDV:  125 ml QGS ESV:  61 ml LV Ejection Fraction: 51%     Rest ECG: Atrial paced Ventricular sensed  Stress ECG: No significant change from baseline ECG  QPS Raw Data Images:  Normal; no motion artifact; normal heart/lung ratio. Stress Images:  Normal homogeneous uptake in all areas of the myocardium. Rest Images:  Normal homogeneous uptake in all areas of the myocardium. Subtraction (SDS):  No evidence of ischemia.  Impression Exercise Capacity:  Lexiscan with no exercise. BP Response:  Normal blood pressure response. Clinical Symptoms:  No significant symptoms noted. ECG Impression:  No significant ST segment change suggestive of ischemia. Comparison with Prior Nuclear Study: No previous nuclear study performed  Overall Impression:  Normal stress nuclear study.  LV Wall Motion:  Low normal left ventricular systolic function, normal regional wall motion   ,, MD  01/30/2013 5:53 PM

## 2013-01-31 ENCOUNTER — Encounter: Payer: Self-pay | Admitting: Cardiovascular Disease

## 2013-01-31 ENCOUNTER — Telehealth: Payer: Self-pay | Admitting: *Deleted

## 2013-01-31 NOTE — Telephone Encounter (Addendum)
Does the diaphoresis coincide with initiation of Wellbutrin? According to the patient it seems as though the symptoms do.  Recommend follow up with PCP for change in medication.  ,  2:36 PM

## 2013-01-31 NOTE — Telephone Encounter (Signed)
Please call him

## 2013-01-31 NOTE — Telephone Encounter (Signed)
My Chart message from pt: Am waiting to hear my results on my stress test yesterday. I have been sweating profusely lately, but today seems to be much worse and my color no so good. Please advise. Thank you George Wolf, Thursday 9-4 @ 11:15 am  Returned call.  Left message to call back before 4pm.

## 2013-01-31 NOTE — Telephone Encounter (Signed)
Returned call.  Pt c/o intermittent sweating "profusely" for the last couple of months.  Stated he didn't know if that had anything do with why Dr. Salena Saner ordered the stress test.  Pt denied any symptoms except sweating and being flushed.  Pt denied CP, SOB or dizziness w/ episodes of sweating.  Reviewed chart and per Dr. Salena Saner, stress test normal.  Pt informed.  Reviewed med list and pt is taking Wellbutrin.  Pt informed sweating and flushing are common SEs of the med and may be the cause of pt's symptoms.  Informed Dr. Salena Saner is out of the office and PA will be notified.  Pt verbalized understanding and agreed w/ plan.  Pt asked RN to talk to wife.  Wife stated this has been going on all summer.  Stated pt sweats w/ very little to no exertion and will be drenched from head to toe.  Stated pt flushed w/ sweating and pale after episode.  Also stated pt has to rest a lot.  Informed wife as RN advised pt above.  Wife agreed w/ plan and will await call back.  Of note, pt and wife advised NOT to use My Chart to send messages r/t health concerns or active symptoms as the messages are supposed to be NON-URGENT.  Both verbalized understanding.  Message forwarded to B. Leron Croak, PA-C for further instructions.

## 2013-01-31 NOTE — Telephone Encounter (Signed)
Returned call to pt and asked if sweating started when he started taking Wellbutrin.  Pt stated he's been on Wellbutrin for "a couple of years" and having sweating x 6-8 months.  Stated after he talked to me earlier, he talked w/ his wife and looked up the med.  Stated he has quite a few symptoms and wants to f/u with his PCP b/c they have a service that evaluates all meds and adjusts them if needed.  Pt stated he noticed his symptoms started about "an hour or so" after taking the Wellbutrin.    Penni Bombard, PA-C notified.  Advice: Since stress test normal, see PCP to possibly stop Wellbutrin since they started him on it.  Pt informed and verbalized understanding.

## 2013-02-04 ENCOUNTER — Telehealth: Payer: Self-pay | Admitting: *Deleted

## 2013-02-04 NOTE — Telephone Encounter (Signed)
NUC results voiced to patient.

## 2013-04-04 ENCOUNTER — Other Ambulatory Visit: Payer: Self-pay

## 2013-04-22 ENCOUNTER — Ambulatory Visit: Payer: Medicare Other

## 2013-05-02 LAB — MDC_IDC_ENUM_SESS_TYPE_REMOTE
Brady Statistic RA Percent Paced: 88 %
Brady Statistic RV Percent Paced: 5 %
Implantable Pulse Generator Serial Number: 112155
Lead Channel Impedance Value: 604 Ohm
Lead Channel Sensing Intrinsic Amplitude: 12 mV

## 2013-07-24 ENCOUNTER — Ambulatory Visit (INDEPENDENT_AMBULATORY_CARE_PROVIDER_SITE_OTHER): Payer: Medicare Other | Admitting: *Deleted

## 2013-07-24 DIAGNOSIS — I498 Other specified cardiac arrhythmias: Secondary | ICD-10-CM

## 2013-07-24 DIAGNOSIS — I4729 Other ventricular tachycardia: Secondary | ICD-10-CM

## 2013-07-24 DIAGNOSIS — I472 Ventricular tachycardia, unspecified: Secondary | ICD-10-CM

## 2013-07-24 DIAGNOSIS — R001 Bradycardia, unspecified: Secondary | ICD-10-CM

## 2013-07-24 LAB — MDC_IDC_ENUM_SESS_TYPE_REMOTE
Battery Remaining Longevity: 11.5
Brady Statistic RA Percent Paced: 88 %
Implantable Pulse Generator Serial Number: 112155
Lead Channel Sensing Intrinsic Amplitude: 4.1 mV
Lead Channel Setting Pacing Amplitude: 2 V
Lead Channel Setting Sensing Sensitivity: 2.5 mV
MDC IDC MSMT LEADCHNL RA IMPEDANCE VALUE: 680 Ohm
MDC IDC MSMT LEADCHNL RV IMPEDANCE VALUE: 626 Ohm
MDC IDC MSMT LEADCHNL RV SENSING INTR AMPL: 13.3 mV
MDC IDC SET LEADCHNL RV PACING AMPLITUDE: 2 V
MDC IDC SET LEADCHNL RV PACING PULSEWIDTH: 0.4 ms
MDC IDC STAT BRADY RV PERCENT PACED: 5 %

## 2013-08-06 ENCOUNTER — Encounter: Payer: Self-pay | Admitting: *Deleted

## 2013-08-13 ENCOUNTER — Encounter: Payer: Self-pay | Admitting: Cardiovascular Disease

## 2013-08-23 LAB — PACEMAKER DEVICE OBSERVATION

## 2013-10-29 ENCOUNTER — Telehealth: Payer: Self-pay | Admitting: Cardiology

## 2013-10-29 ENCOUNTER — Encounter: Payer: Medicare Other | Admitting: *Deleted

## 2013-10-29 NOTE — Telephone Encounter (Signed)
Pt informed me that he is out of town today and that he does not have his monitor with him. I informed pt that monitor has to be within 6-10 ft from him. Pt verbalized understanding and said he would send transmission when he gets back in town.

## 2013-11-18 ENCOUNTER — Ambulatory Visit (INDEPENDENT_AMBULATORY_CARE_PROVIDER_SITE_OTHER): Payer: Medicare Other | Admitting: *Deleted

## 2013-11-18 DIAGNOSIS — I498 Other specified cardiac arrhythmias: Secondary | ICD-10-CM

## 2013-11-18 DIAGNOSIS — R001 Bradycardia, unspecified: Secondary | ICD-10-CM

## 2013-11-18 DIAGNOSIS — I4729 Other ventricular tachycardia: Secondary | ICD-10-CM

## 2013-11-18 DIAGNOSIS — I472 Ventricular tachycardia: Secondary | ICD-10-CM

## 2013-11-19 NOTE — Progress Notes (Signed)
Remote pacemaker transmission.   

## 2013-11-25 LAB — MDC_IDC_ENUM_SESS_TYPE_REMOTE
Implantable Pulse Generator Serial Number: 112155
Lead Channel Sensing Intrinsic Amplitude: 11.7 mV
Lead Channel Setting Pacing Amplitude: 2 V
Lead Channel Setting Sensing Sensitivity: 2.5 mV
MDC IDC MSMT LEADCHNL RA IMPEDANCE VALUE: 641 Ohm
MDC IDC MSMT LEADCHNL RA SENSING INTR AMPL: 2.9 mV
MDC IDC MSMT LEADCHNL RV IMPEDANCE VALUE: 611 Ohm
MDC IDC SET LEADCHNL RA PACING AMPLITUDE: 2 V
MDC IDC SET LEADCHNL RV PACING PULSEWIDTH: 0.4 ms
MDC IDC STAT BRADY RA PERCENT PACED: 75 %
MDC IDC STAT BRADY RV PERCENT PACED: 0 %

## 2013-11-26 ENCOUNTER — Encounter: Payer: Self-pay | Admitting: Cardiovascular Disease

## 2013-12-11 ENCOUNTER — Encounter: Payer: Self-pay | Admitting: Cardiology

## 2013-12-13 ENCOUNTER — Encounter: Payer: Self-pay | Admitting: Cardiology

## 2013-12-17 ENCOUNTER — Ambulatory Visit (INDEPENDENT_AMBULATORY_CARE_PROVIDER_SITE_OTHER): Payer: Medicare Other | Admitting: Cardiovascular Disease

## 2013-12-17 ENCOUNTER — Encounter: Payer: Self-pay | Admitting: Cardiovascular Disease

## 2013-12-17 VITALS — BP 130/82 | HR 88 | Resp 16 | Ht 72.0 in | Wt 258.4 lb

## 2013-12-17 DIAGNOSIS — I480 Paroxysmal atrial fibrillation: Secondary | ICD-10-CM

## 2013-12-17 DIAGNOSIS — I4891 Unspecified atrial fibrillation: Secondary | ICD-10-CM

## 2013-12-17 DIAGNOSIS — I472 Ventricular tachycardia: Secondary | ICD-10-CM

## 2013-12-17 DIAGNOSIS — I498 Other specified cardiac arrhythmias: Secondary | ICD-10-CM

## 2013-12-17 DIAGNOSIS — Z950 Presence of cardiac pacemaker: Secondary | ICD-10-CM

## 2013-12-17 DIAGNOSIS — R001 Bradycardia, unspecified: Secondary | ICD-10-CM

## 2013-12-17 DIAGNOSIS — I4729 Other ventricular tachycardia: Secondary | ICD-10-CM

## 2013-12-17 LAB — MDC_IDC_ENUM_SESS_TYPE_INCLINIC
Lead Channel Pacing Threshold Amplitude: 1 V
Lead Channel Pacing Threshold Pulse Width: 0.4 ms
Lead Channel Sensing Intrinsic Amplitude: 11.5 mV
Lead Channel Sensing Intrinsic Amplitude: 2.6 mV
Lead Channel Setting Pacing Amplitude: 2 V
Lead Channel Setting Pacing Amplitude: 2 V
Lead Channel Setting Pacing Pulse Width: 0.4 ms
MDC IDC MSMT LEADCHNL RA IMPEDANCE VALUE: 652 Ohm
MDC IDC MSMT LEADCHNL RA PACING THRESHOLD AMPLITUDE: 0.7 V
MDC IDC MSMT LEADCHNL RA PACING THRESHOLD PULSEWIDTH: 0.4 ms
MDC IDC MSMT LEADCHNL RV IMPEDANCE VALUE: 627 Ohm
MDC IDC PG SERIAL: 112155
MDC IDC SESS DTM: 20150721040000
MDC IDC SET LEADCHNL RV SENSING SENSITIVITY: 2.5 mV
MDC IDC SET ZONE DETECTION INTERVAL: 375 ms

## 2013-12-17 NOTE — Progress Notes (Signed)
Patient ID: George Wolf, male   DOB: 05-Apr-1946, 68 y.o.   MRN: [id number]      Reason for office visit SSS, pacemaker followup, paroxysmal atrial fibrillation, nonsustained ventricular tachycardia   George Wolf is generally doing well and is very active for his age. He exercises regularly. He received a dual-chamber permanent pacemaker in 2013 for symptomatic bradycardia due to sinus node dysfunction. Every now and again his pacemaker records brief episodes of nonsustained ventricular tachycardia, always less than 10 seconds in duration and always asymptomatic. In March of this year he had a single episode of paroxysmal atrial fibrillation with spontaneously controlled ventricular rate. It occurred at night and was asymptomatic. It lasted for just under 3 hours. No recurrent events since. He has not have a history of diabetes mellitus, heart failure, previous stroke or TIA or known vascular disease. He has mild treated systemic hypertension. He had a normal nuclear stress test and echocardiogram about 2 years ago.  He continues to complain of less than perfect exercise tolerance. Interrogation of his pacemaker shows fairly blunted heart rate histograms and we adjusted his rate responsiveness sensor again today. Otherwise pacemaker function is normal. He has 80% atrial pacing and only 5% ventricular paced  Allergies  Allergen Reactions  . Penicillins Hives    Current Outpatient Prescriptions  Medication Sig Dispense Refill  . benazepril (LOTENSIN) 20 MG tablet Take 20 mg by mouth daily.      Marland Kitchen buPROPion (WELLBUTRIN SR) 150 MG 12 hr tablet Take 150 mg by mouth 2 (two) times daily.      . clonazePAM (KLONOPIN) 0.5 MG tablet Take 0.5 mg by mouth daily as needed.      . fexofenadine (ALLEGRA) 180 MG tablet Take 180 mg by mouth daily as needed.       . fish oil-omega-3 fatty acids 1000 MG capsule Take 1 g by mouth daily.      . hydrochlorothiazide (HYDRODIURIL) 25 MG tablet Take 25 mg by mouth  daily.      . magnesium oxide (MAG-OX) 400 MG tablet Take 400 mg by mouth daily.      . Multiple Vitamin (MULTIVITAMIN WITH MINERALS) TABS Take 1 tablet by mouth daily.      . sertraline (ZOLOFT) 100 MG tablet Take 150 mg by mouth daily.        No current facility-administered medications for this visit.    Past Medical History  Diagnosis Date  . Sinus node dysfunction     [employer] pacemaker 12/05/11  . Hypertension     Past Surgical History  Procedure Laterality Date  . Permanent pacemaker insertion  12/05/2011    [employer]  . Replacement total knee  03/23/2009    right  . US echocardiography  11/01/2011    mildly dilated LA    Family History  Problem Relation Age of Onset  . Diabetes Father     History   Social History  . Marital Status: Married    Spouse Name: N/A    Number of Children: N/A  . Years of Education: N/A   Occupational History  . Not on file.   Social History Main Topics  . Smoking status: Never Smoker   . Smokeless tobacco: Never Used  . Alcohol Use: Yes  . Drug Use: No  . Sexual Activity: Yes    Partners: Female   Other Topics Concern  . Not on file   Social History Narrative  . No narrative on file  Review of systems: The patient specifically denies any chest pain at rest or with exertion, dyspnea at rest or with exertion, orthopnea, paroxysmal nocturnal dyspnea, syncope, palpitations, focal neurological deficits, intermittent claudication, lower extremity edema, unexplained weight gain, cough, hemoptysis or wheezing.  The patient also denies abdominal pain, nausea, vomiting, dysphagia, diarrhea, constipation, polyuria, polydipsia, dysuria, hematuria, frequency, urgency, abnormal bleeding or bruising, fever, chills, unexpected weight changes, mood swings, change in skin or hair texture, change in voice quality, auditory or visual problems, allergic reactions or rashes, new musculoskeletal complaints other than usual "aches and  pains".   PHYSICAL EXAM BP 130/82  Pulse 88  Resp 16  Ht 6' (1.829 m)  Wt 258 lb 6.4 oz (117.209 kg)  BMI 35.04 kg/m2  General: Alert, oriented x3, no distress Head: no evidence of trauma, PERRL, EOMI, no exophtalmos or lid lag, no myxedema, no xanthelasma; normal ears, nose and oropharynx Neck: normal jugular venous pulsations and no hepatojugular reflux; brisk carotid pulses without delay and no carotid bruits Chest: clear to auscultation, no signs of consolidation by percussion or palpation, normal fremitus, symmetrical and full respiratory excursions; a left subclavian pacemaker site is healthy Cardiovascular: normal position and quality of the apical impulse, regular rhythm, normal first and second heart sounds, no murmurs, rubs or gallops Abdomen: no tenderness or distention, no masses by palpation, no abnormal pulsatility or arterial bruits, normal bowel sounds, no hepatosplenomegaly Extremities: no clubbing, cyanosis or edema; 2+ radial, ulnar and brachial pulses bilaterally; 2+ right femoral, posterior tibial and dorsalis pedis pulses; 2+ left femoral, posterior tibial and dorsalis pedis pulses; no subclavian or femoral bruits Neurological: grossly nonfocal   EKG: Atrial paced, ventricular sensed, otherwise normal  Lipid Panel  No results found for this basename: chol, trig, hdl, cholhdl, vldl, ldlcalc    BMET    Component Value Date/Time   NA 134* 04/05/2009 1915   K 3.6 04/05/2009 1915   CL 97 04/05/2009 1915   CO2 26 04/05/2009 1915   GLUCOSE 120* 04/05/2009 1915   BUN 26* 04/05/2009 1915   CREATININE 1.1 04/05/2009 1915   CALCIUM 8.7 04/05/2009 1915   GFRNONAA >60 04/05/2009 1915   GFRAA  Value: >60        The eGFR has been calculated using the MDRD equation. This calculation has not been validated in all clinical situations. eGFR's persistently <60 mL/min signify possible Chronic Kidney Disease. 04/05/2009 1915     ASSESSMENT AND PLAN NSVT (nonsustained ventricular  tachycardia) No evidence of structural heart disease by recent workup. The episodes are infrequent brief and asymptomatic. No specific therapy recommended.  Paroxysmal atrial fibrillation CHADSVasc score is 1-2 (he just turned 68 years old). Biologically, he is younger than his chronological age. For the time being I think aspirin is sufficient for stroke prophylaxis. He has even a brief this neurological event, the risk-benefit ratio would tilt in favor of full anticoagulation with warfarin or equivalent drug.  Pacemaker, dual-chamber [employer], sinus node dysfunction, implanted 12/05/11 Normal device function. Rate response was made a little more aggressive today. Continue remote checks every 3 months and an office evaluation yearly.   Orders Placed This Encounter  Procedures  . EKG 12-Lead   No orders of the defined types were placed in this encounter.    Holli Humbles, MD, [place] [phone] office [phone] pager

## 2013-12-17 NOTE — Assessment & Plan Note (Signed)
Normal device function. Rate response was made a little more aggressive today. Continue remote checks every 3 months and an office evaluation yearly.

## 2013-12-17 NOTE — Assessment & Plan Note (Signed)
CHADSVasc score is 1-2 (he just turned 68 years old). Biologically, he is younger than his chronological age. For the time being I think aspirin is sufficient for stroke prophylaxis. He has even a brief this neurological event, the risk-benefit ratio would tilt in favor of full anticoagulation with warfarin or equivalent drug.

## 2013-12-17 NOTE — Assessment & Plan Note (Signed)
No evidence of structural heart disease by recent workup. The episodes are infrequent brief and asymptomatic. No specific therapy recommended.

## 2013-12-17 NOTE — Patient Instructions (Signed)
Remote monitoring is used to monitor your Pacemaker from home. This monitoring reduces the number of office visits required to check your device to one time per year. It allows [place] to monitor the functioning of your device to ensure it is working properly. You are scheduled for a device check from home on October 22,2015. You may send your transmission at any time that day. If you have a wireless device, the transmission will be sent automatically. After your physician reviews your transmission, you will receive a postcard with your next transmission date.  Dr. Sallyanne Kuster recommends that you schedule a follow-up appointment in: One year.

## 2014-01-02 ENCOUNTER — Encounter: Payer: Self-pay | Admitting: Cardiovascular Disease

## 2014-03-20 ENCOUNTER — Ambulatory Visit (INDEPENDENT_AMBULATORY_CARE_PROVIDER_SITE_OTHER): Payer: Medicare Other | Admitting: *Deleted

## 2014-03-20 ENCOUNTER — Encounter: Payer: Self-pay | Admitting: Cardiovascular Disease

## 2014-03-20 DIAGNOSIS — R001 Bradycardia, unspecified: Secondary | ICD-10-CM

## 2014-03-20 NOTE — Progress Notes (Signed)
Remote pacemaker transmission.   

## 2014-03-26 LAB — MDC_IDC_ENUM_SESS_TYPE_REMOTE
Brady Statistic RV Percent Paced: 5 %
Lead Channel Setting Pacing Amplitude: 2 V
Lead Channel Setting Pacing Amplitude: 2 V
Lead Channel Setting Pacing Pulse Width: 0.4 ms
MDC IDC MSMT LEADCHNL RA IMPEDANCE VALUE: 658 Ohm
MDC IDC MSMT LEADCHNL RA SENSING INTR AMPL: 3.5 mV
MDC IDC MSMT LEADCHNL RV IMPEDANCE VALUE: 623 Ohm
MDC IDC MSMT LEADCHNL RV SENSING INTR AMPL: 15.6 mV
MDC IDC PG SERIAL: 112155
MDC IDC SET LEADCHNL RV SENSING SENSITIVITY: 2.5 mV
MDC IDC STAT BRADY RA PERCENT PACED: 89 %
Zone Setting Detection Interval: 375 ms

## 2014-04-02 ENCOUNTER — Encounter: Payer: Self-pay | Admitting: Cardiology

## 2014-04-18 ENCOUNTER — Encounter: Payer: Self-pay | Admitting: Cardiology

## 2014-05-08 ENCOUNTER — Encounter (HOSPITAL_COMMUNITY): Payer: Self-pay | Admitting: Cardiovascular Disease

## 2014-06-23 ENCOUNTER — Ambulatory Visit (INDEPENDENT_AMBULATORY_CARE_PROVIDER_SITE_OTHER): Payer: Medicare HMO | Admitting: *Deleted

## 2014-06-23 DIAGNOSIS — I480 Paroxysmal atrial fibrillation: Secondary | ICD-10-CM

## 2014-06-23 NOTE — Progress Notes (Signed)
Remote pacemaker transmission.   

## 2014-06-29 LAB — MDC_IDC_ENUM_SESS_TYPE_REMOTE
Battery Remaining Longevity: 126 mo
Battery Remaining Percentage: 100 %
Brady Statistic RA Percent Paced: 87 %
Date Time Interrogation Session: 20160126042900
Lead Channel Impedance Value: 587 Ohm
Lead Channel Impedance Value: 599 Ohm
Lead Channel Setting Pacing Amplitude: 2 V
MDC IDC PG SERIAL: 112155
MDC IDC SET LEADCHNL RV PACING AMPLITUDE: 2 V
MDC IDC SET LEADCHNL RV PACING PULSEWIDTH: 0.4 ms
MDC IDC SET LEADCHNL RV SENSING SENSITIVITY: 2.5 mV
MDC IDC SET ZONE DETECTION INTERVAL: 375 ms
MDC IDC STAT BRADY RV PERCENT PACED: 4 %

## 2014-07-09 ENCOUNTER — Encounter: Payer: Self-pay | Admitting: Cardiology

## 2014-07-17 ENCOUNTER — Encounter: Payer: Self-pay | Admitting: Cardiovascular Disease

## 2014-07-23 ENCOUNTER — Encounter: Payer: Self-pay | Admitting: Cardiology

## 2014-09-25 ENCOUNTER — Ambulatory Visit (INDEPENDENT_AMBULATORY_CARE_PROVIDER_SITE_OTHER): Payer: Medicare HMO | Admitting: *Deleted

## 2014-09-25 DIAGNOSIS — I480 Paroxysmal atrial fibrillation: Secondary | ICD-10-CM | POA: Diagnosis not present

## 2014-09-25 NOTE — Progress Notes (Signed)
Remote pacemaker transmission.   

## 2014-10-03 LAB — CUP PACEART REMOTE DEVICE CHECK
Battery Remaining Longevity: 126 mo
Brady Statistic RV Percent Paced: 3 %
Lead Channel Pacing Threshold Amplitude: 0.7 V
Lead Channel Setting Pacing Amplitude: 2 V
MDC IDC MSMT BATTERY REMAINING PERCENTAGE: 100 %
MDC IDC MSMT LEADCHNL RA IMPEDANCE VALUE: 640 Ohm
MDC IDC MSMT LEADCHNL RA PACING THRESHOLD PULSEWIDTH: 0.4 ms
MDC IDC MSMT LEADCHNL RV IMPEDANCE VALUE: 605 Ohm
MDC IDC SESS DTM: 20160428054100
MDC IDC SET LEADCHNL RA PACING AMPLITUDE: 2 V
MDC IDC SET LEADCHNL RV PACING PULSEWIDTH: 0.4 ms
MDC IDC SET LEADCHNL RV SENSING SENSITIVITY: 2.5 mV
MDC IDC SET ZONE DETECTION INTERVAL: 375 ms
MDC IDC STAT BRADY RA PERCENT PACED: 87 %
Pulse Gen Serial Number: 112155

## 2014-10-07 ENCOUNTER — Encounter: Payer: Self-pay | Admitting: Cardiology

## 2014-10-08 ENCOUNTER — Encounter: Payer: Self-pay | Admitting: Cardiovascular Disease

## 2014-10-21 ENCOUNTER — Encounter: Payer: Self-pay | Admitting: Cardiology

## 2014-11-24 ENCOUNTER — Other Ambulatory Visit: Payer: Self-pay

## 2014-12-09 ENCOUNTER — Encounter: Payer: Self-pay | Admitting: Cardiovascular Disease

## 2014-12-09 ENCOUNTER — Telehealth: Payer: Self-pay | Admitting: *Deleted

## 2014-12-09 ENCOUNTER — Ambulatory Visit (INDEPENDENT_AMBULATORY_CARE_PROVIDER_SITE_OTHER): Payer: Medicare HMO | Admitting: Cardiovascular Disease

## 2014-12-09 VITALS — BP 142/86 | HR 65 | Ht 72.0 in | Wt 249.0 lb

## 2014-12-09 DIAGNOSIS — I10 Essential (primary) hypertension: Secondary | ICD-10-CM

## 2014-12-09 DIAGNOSIS — R001 Bradycardia, unspecified: Secondary | ICD-10-CM | POA: Diagnosis not present

## 2014-12-09 DIAGNOSIS — Z950 Presence of cardiac pacemaker: Secondary | ICD-10-CM | POA: Diagnosis not present

## 2014-12-09 LAB — CUP PACEART INCLINIC DEVICE CHECK
Battery Remaining Longevity: 10
Brady Statistic RA Percent Paced: 87 %
Lead Channel Impedance Value: 602 Ohm
Lead Channel Pacing Threshold Amplitude: 0.9 V
Lead Channel Pacing Threshold Pulse Width: 0.4 ms
Lead Channel Sensing Intrinsic Amplitude: 11.1 mV
Lead Channel Sensing Intrinsic Amplitude: 2.9 mV
Lead Channel Setting Pacing Amplitude: 2.4 V
Lead Channel Setting Sensing Sensitivity: 2.5 mV
MDC IDC MSMT LEADCHNL RA PACING THRESHOLD AMPLITUDE: 0.6 V
MDC IDC MSMT LEADCHNL RV IMPEDANCE VALUE: 599 Ohm
MDC IDC MSMT LEADCHNL RV PACING THRESHOLD PULSEWIDTH: 0.6 ms
MDC IDC PG SERIAL: 112155
MDC IDC SESS DTM: 20160712040000
MDC IDC SET LEADCHNL RA PACING AMPLITUDE: 2 V
MDC IDC SET LEADCHNL RV PACING PULSEWIDTH: 0.6 ms
MDC IDC SET ZONE DETECTION INTERVAL: 375 ms
MDC IDC STAT BRADY RV PERCENT PACED: 3 %

## 2014-12-09 MED ORDER — ASPIRIN EC 81 MG PO TBEC: 81.0000 mg | DELAYED_RELEASE_TABLET | Freq: Every day | ORAL | Status: DC

## 2014-12-09 NOTE — Telephone Encounter (Signed)
-----   Message from Sanda Klein, MD sent at 12/09/2014  4:22 PM EDT ----- Aspirin 81 mg daily not listed on his medication list. Please make sure he is taking this.

## 2014-12-09 NOTE — Progress Notes (Signed)
Patient ID: George Wolf, male   DOB: July 02, 1945, 69 y.o.   MRN: [id number]     Cardiology Office Note   Date:  12/09/2014   ID:  Elazar, Argabright 1945/06/02, MRN [id number]  PCP:  Merrilee Seashore, MD  Cardiologist:   Sanda Klein, MD   Chief Complaint  Patient presents with  . Annual Exam    Patient has no complaints.      History of Present Illness: George Wolf is a 69 y.o. male who presents for follow-up for infrequent asymptomatic paroxysmal atrial fibrillation, asymptomatic nonsustained ventricular tachycardia and sinus node dysfunction status post dual-chamber permanent pacemaker implantation. He feels much better following the adjustments in says her settings at his last appointment, with more energy. He still thinks that he is not "in shape" and plans to lose weight and exercise more.  Device interrogation shows normal function with 88% atrial pacing and only 3% ventricular pacing. The heart rate histogram distribution is generally favorable may be very slightly blunted. Generator longevity is estimated at 10 years. There has been no further recorded atrial fibrillation or ventricular tachycardia and had only one very brief episode of paroxysmal atrial tachycardia. He has not had any bleeding or focal neurological events.  He has only had one episode of paroxysmal atrial fibrillation was spontaneously controlled ventricular rate that occurred in March 2015. No arrhythmia has been recorded since then. He has no evidence of structural heart disease by previous stress testing and echocardiography. He is currently only receiving aspirin for stroke prevention.  Past Medical History  Diagnosis Date  . Sinus node dysfunction     [employer] pacemaker 12/05/11  . Hypertension     Past Surgical History  Procedure Laterality Date  . Permanent pacemaker insertion  12/05/2011    [employer]  . Replacement total knee  03/23/2009    right  . US echocardiography   11/01/2011    mildly dilated LA  . Permanent pacemaker insertion N/A 12/05/2011    Procedure: PERMANENT PACEMAKER INSERTION;  Surgeon: Sanda Klein, MD;  Location: [facility];  Service: Cardiovascular;  Laterality: N/A;     Current Outpatient Prescriptions  Medication Sig Dispense Refill  . allopurinol (ZYLOPRIM) 100 MG tablet Take 100 mg by mouth daily.  9  . benazepril (LOTENSIN) 20 MG tablet Take 20 mg by mouth daily.    Marland Kitchen buPROPion (WELLBUTRIN SR) 150 MG 12 hr tablet Take 150 mg by mouth 2 (two) times daily.    . clonazePAM (KLONOPIN) 0.5 MG tablet Take 0.5 mg by mouth daily as needed.    . fexofenadine (ALLEGRA) 180 MG tablet Take 180 mg by mouth daily as needed.     . fish oil-omega-3 fatty acids 1000 MG capsule Take 1 g by mouth daily.    . hydrochlorothiazide (HYDRODIURIL) 25 MG tablet Take 25 mg by mouth daily.    . magnesium oxide (MAG-OX) 400 MG tablet Take 400 mg by mouth daily.    . Multiple Vitamin (MULTIVITAMIN WITH MINERALS) TABS Take 1 tablet by mouth daily.    . sertraline (ZOLOFT) 100 MG tablet Take 150 mg by mouth daily.      No current facility-administered medications for this visit.    Allergies:   Penicillins    Social History:  The patient  reports that he has never smoked. He has never used smokeless tobacco. He reports that he drinks alcohol. He reports that he does not use illicit drugs.   Family History:  The patient's  family history includes Diabetes in his father.    ROS:  Please see the history of present illness.    Otherwise, review of systems positive for none.   All other systems are reviewed and negative.    PHYSICAL EXAM: VS:  BP 142/86 mmHg  Pulse 65  Ht 6' (1.829 m)  Wt 249 lb (112.946 kg)  BMI 33.76 kg/m2 , BMI Body mass index is 33.76 kg/(m^2).  General: Alert, oriented x3, no distress Head: no evidence of trauma, PERRL, EOMI, no exophtalmos or lid lag, no myxedema, no xanthelasma; normal ears, nose and oropharynx Neck: normal  jugular venous pulsations and no hepatojugular reflux; brisk carotid pulses without delay and no carotid bruits Chest: clear to auscultation, no signs of consolidation by percussion or palpation, normal fremitus, symmetrical and full respiratory excursions,  Cardiovascular: normal position and quality of the apical impulse, regular rhythm, normal first and second heart sounds, no murmurs, rubs or gallops Abdomen: no tenderness or distention, no masses by palpation, no abnormal pulsatility or arterial bruits, normal bowel sounds, no hepatosplenomegaly Extremities: no clubbing, cyanosis or edema; 2+ radial, ulnar and brachial pulses bilaterally; 2+ right femoral, posterior tibial and dorsalis pedis pulses; 2+ left femoral, posterior tibial and dorsalis pedis pulses; no subclavian or femoral bruits Neurological: grossly nonfocal Psych: euthymic mood, full affect   EKG:  EKG is ordered today. The ekg ordered today demonstrates atrial paced ventricular sensed rhythm otherwise normal   Recent Labs: No results found for requested labs within last 365 days.    Lipid Panel No results found for: CHOL, TRIG, HDL, CHOLHDL, VLDL, LDLCALC, LDLDIRECT    Wt Readings from Last 3 Encounters:  12/09/14 249 lb (112.946 kg)  12/17/13 258 lb 6.4 oz (117.209 kg)  01/30/13 252 lb (114.306 kg)      Other studies Reviewed: ASSESSMENT AND PLAN:  1. Symptomatic sinus bradycardia with marked chronotropic incompetence. He generally seems satisfied with the current sensor settings, although there is still very slight blunting of the histograms. If his plans to "get in shape" do not lead to improvement in stamina, may consider making his has a settings a little more aggressive, based on treadmill stress testing.  2. Systemic hypertension with excellent control when checked at home. Borderline elevation in blood pressure today. No changes in medications  3. Remote history of isolated episode of paroxysmal atrial  fibrillation, on aspirin for stroke prevention. As his age advances his risk of embolic events increases and if he has recurrent atrial fibrillation it would probably be prudent to switch to a true anticoagulated.  4. History of asymptomatic pacemaker recorded nonsustained ventricular tachycardia likely not of clinical significance in the absence of structural heart disease.    Current medicines are reviewed at length with the patient today.  The patient does not have concerns regarding medicines.  The following changes have been made:  no change  Labs/ tests ordered today include:  Orders Placed This Encounter  Procedures  . Implantable device check    Patient Instructions  Please call the office if you do not notice an increase in your energy level after increasing your exercise for six weeks. If your energy level does not increase a treadmill exercise test will be ordered for you.  Remote monitoring is used to monitor your pacemaker from home. This monitoring reduces the number of office visits required to check your device to one time per year. It allows [place] to keep an eye on the functioning of your device to  ensure it is working properly. You are scheduled for a device check from home on 03/10/2015. You may send your transmission at any time that day. If you have a wireless device, the transmission will be sent automatically. After your physician reviews your transmission, you will receive a postcard with your next transmission date.  Your physician recommends that you schedule a follow-up appointment in: 6 months with Dr.     Signed, Sanda Klein, MD  12/09/2014 4:20 PM    Sanda Klein, MD, [facility] [phone] office [phone] pager

## 2014-12-09 NOTE — Patient Instructions (Signed)
Please call the office if you do not notice an increase in your energy level after increasing your exercise for six weeks. If your energy level does not increase a treadmill exercise test will be ordered for you.  Remote monitoring is used to monitor your pacemaker from home. This monitoring reduces the number of office visits required to check your device to one time per year. It allows [place] to keep an eye on the functioning of your device to ensure it is working properly. You are scheduled for a device check from home on 03/10/2015. You may send your transmission at any time that day. If you have a wireless device, the transmission will be sent automatically. After your physician reviews your transmission, you will receive a postcard with your next transmission date.  Your physician recommends that you schedule a follow-up appointment in: 6 months with Dr.Croitoru

## 2015-01-02 ENCOUNTER — Encounter: Payer: Self-pay | Admitting: Cardiovascular Disease

## 2015-01-05 ENCOUNTER — Encounter: Payer: Self-pay | Admitting: Cardiovascular Disease

## 2015-02-18 ENCOUNTER — Telehealth: Payer: Self-pay | Admitting: Cardiovascular Disease

## 2015-02-18 NOTE — Telephone Encounter (Signed)
Called George Wolf and discussed pacemaker recording of paroxysmal atrial fibrillation episode that occurred on Sept 15, 0159h for > 30 minutes, automated device remote download.  Asked him to come in for appt to discuss options for anticoagulation. Will call tomorrow to make an appointment

## 2015-03-10 ENCOUNTER — Encounter: Payer: Self-pay | Admitting: Cardiovascular Disease

## 2015-03-10 ENCOUNTER — Ambulatory Visit (INDEPENDENT_AMBULATORY_CARE_PROVIDER_SITE_OTHER): Payer: Medicare HMO | Admitting: *Deleted

## 2015-03-10 DIAGNOSIS — R001 Bradycardia, unspecified: Secondary | ICD-10-CM

## 2015-03-10 NOTE — Progress Notes (Signed)
Remote pacemaker transmission.   

## 2015-03-12 LAB — CUP PACEART REMOTE DEVICE CHECK
Battery Remaining Longevity: 120 mo
Battery Remaining Percentage: 100 %
Implantable Lead Implant Date: 20130708
Implantable Lead Location: 753859
Implantable Lead Model: 4137
Implantable Lead Serial Number: 29021711
Implantable Lead Serial Number: 29201221
Lead Channel Sensing Intrinsic Amplitude: 13 mV
Lead Channel Setting Pacing Amplitude: 2.4 V
Lead Channel Setting Pacing Pulse Width: 0.6 ms
MDC IDC LEAD IMPLANT DT: 20130708
MDC IDC LEAD LOCATION: 753860
MDC IDC LEAD MODEL: 4136
MDC IDC MSMT LEADCHNL RA IMPEDANCE VALUE: 664 Ohm
MDC IDC MSMT LEADCHNL RA SENSING INTR AMPL: 4 mV
MDC IDC MSMT LEADCHNL RV IMPEDANCE VALUE: 611 Ohm
MDC IDC PG SERIAL: 112155
MDC IDC SESS DTM: 20161011124000
MDC IDC SET LEADCHNL RA PACING AMPLITUDE: 2 V
MDC IDC SET LEADCHNL RV SENSING SENSITIVITY: 2.5 mV
MDC IDC STAT BRADY RA PERCENT PACED: 86 %
MDC IDC STAT BRADY RV PERCENT PACED: 3 %
Zone Setting Detection Interval: 375 ms
Zone Setting Vendor Type Category: 771137

## 2015-03-17 ENCOUNTER — Encounter: Payer: Self-pay | Admitting: Cardiovascular Disease

## 2015-03-19 ENCOUNTER — Encounter: Payer: Self-pay | Admitting: Cardiology

## 2015-04-02 ENCOUNTER — Encounter: Payer: Self-pay | Admitting: Cardiology

## 2015-04-10 ENCOUNTER — Encounter: Payer: Self-pay | Admitting: Cardiovascular Disease

## 2015-04-10 ENCOUNTER — Ambulatory Visit (INDEPENDENT_AMBULATORY_CARE_PROVIDER_SITE_OTHER): Payer: Medicare HMO | Admitting: Cardiovascular Disease

## 2015-04-10 VITALS — BP 140/83 | HR 60 | Resp 16 | Ht 72.0 in | Wt 252.6 lb

## 2015-04-10 DIAGNOSIS — I480 Paroxysmal atrial fibrillation: Secondary | ICD-10-CM

## 2015-04-10 DIAGNOSIS — I10 Essential (primary) hypertension: Secondary | ICD-10-CM

## 2015-04-10 DIAGNOSIS — Z950 Presence of cardiac pacemaker: Secondary | ICD-10-CM

## 2015-04-10 DIAGNOSIS — R001 Bradycardia, unspecified: Secondary | ICD-10-CM

## 2015-04-10 MED ORDER — RIVAROXABAN 20 MG PO TABS: 20.0000 mg | ORAL_TABLET | Freq: Every day | ORAL | Status: DC

## 2015-04-10 NOTE — Progress Notes (Signed)
Patient ID: George Wolf, male   DOB: Aug 09, 1945, 69 y.o.   MRN: [id number]     Cardiology Office Note   Date:  04/11/2015   ID:  George Wolf 1945/12/04, MRN [id number]  PCP:  Merrilee Seashore, MD  Cardiologist:   Sanda Klein, MD   Chief Complaint  Patient presents with  . Follow-up    no chest pain, no shortness of breath, no edema, no pain in legs, no cramping in legs, no lightheadedness, no dizziness      History of Present Illness: George Wolf is a 69 y.o. male who presents for  Discussion of anticoagulation for newly recognized atrial fibrillation. This appointment was scheduled when his pacemaker showed an episode of asymptomatic atrial fibrillation with spontaneously controlled ventricular rate that was recorded in September. Since then he has had another episode of asymptomatic atrial fibrillation that lasted for 2 hours and 40 minutes just about a week ago. The episode happened during the middle of the night that he was completely unaware of it. The average ventricular rate was around 80 bpm. His pacemaker was implanted in 2013 for symptomatic sinus bradycardia and chronotropic incompetence. Full interrogation of his pacemaker today shows normal device function.  He is almost always in atrial paced, ventricular sensed rhythm, as is the case today.  Past Medical History  Diagnosis Date  . Sinus node dysfunction [facility])     [employer] pacemaker 12/05/11  . Hypertension     Past Surgical History  Procedure Laterality Date  . Permanent pacemaker insertion  12/05/2011    [employer]  . Replacement total knee  03/23/2009    right  . US echocardiography  11/01/2011    mildly dilated LA  . Permanent pacemaker insertion N/A 12/05/2011    Procedure: PERMANENT PACEMAKER INSERTION;  Surgeon: Sanda Klein, MD;  Location: [facility];  Service: Cardiovascular;  Laterality: N/A;     Current Outpatient Prescriptions  Medication Sig Dispense Refill  .  allopurinol (ZYLOPRIM) 100 MG tablet Take 100 mg by mouth daily.  9  . atorvastatin (LIPITOR) 20 MG tablet Take 1 tablet by mouth daily. Take 1 tab daily in the evening  2  . benazepril (LOTENSIN) 20 MG tablet Take 20 mg by mouth daily.    Marland Kitchen buPROPion (WELLBUTRIN SR) 150 MG 12 hr tablet Take 150 mg by mouth 2 (two) times daily.    . clindamycin (CLEOCIN) 300 MG capsule For dental prodcures prn    . clonazePAM (KLONOPIN) 0.5 MG tablet Take 0.5 mg by mouth daily as needed.    . fexofenadine (ALLEGRA) 180 MG tablet Take 180 mg by mouth daily as needed.     . fish oil-omega-3 fatty acids 1000 MG capsule Take 1 g by mouth daily.    Marland Kitchen GLUCOSAMINE-CHONDROIT-CALCIUM PO Take 1 tablet by mouth 2 (two) times daily.    . hydrochlorothiazide (HYDRODIURIL) 25 MG tablet Take 25 mg by mouth daily.    . magnesium oxide (MAG-OX) 400 MG tablet Take 400 mg by mouth daily.    . Multiple Vitamin (MULTIVITAMIN WITH MINERALS) TABS Take 1 tablet by mouth daily.    . rivaroxaban (XARELTO) 20 MG TABS tablet Take 1 tablet (20 mg total) by mouth daily with supper. 30 tablet 6  . sertraline (ZOLOFT) 100 MG tablet Take 150 mg by mouth daily.      No current facility-administered medications for this visit.    Allergies:   Penicillins    Social History:  The patient  reports that he has never smoked. He has never used smokeless tobacco. He reports that he drinks alcohol. He reports that he does not use illicit drugs.   Family History:  The patient's family history includes Diabetes in his father.    ROS:  Please see the history of present illness.    Otherwise, review of systems positive for none.   All other systems are reviewed and negative.    PHYSICAL EXAM: VS:  BP 140/83 mmHg  Pulse 60  Resp 16  Ht 6' (1.829 m)  Wt 252 lb 9 oz (114.562 kg)  BMI 34.25 kg/m2 , BMI Body mass index is 34.25 kg/(m^2).  General: Alert, oriented x3, no distress Head: no evidence of trauma, PERRL, EOMI, no exophtalmos or lid  lag, no myxedema, no xanthelasma; normal ears, nose and oropharynx Neck: normal jugular venous pulsations and no hepatojugular reflux; brisk carotid pulses without delay and no carotid bruits Chest: clear to auscultation, no signs of consolidation by percussion or palpation, normal fremitus, symmetrical and full respiratory excursions,  Healthy pacemaker site Cardiovascular: normal position and quality of the apical impulse, regular rhythm, normal first and second heart sounds, no murmurs, rubs or gallops Abdomen: no tenderness or distention, no masses by palpation, no abnormal pulsatility or arterial bruits, normal bowel sounds, no hepatosplenomegaly Extremities: no clubbing, cyanosis or edema; 2+ radial, ulnar and brachial pulses bilaterally; 2+ right femoral, posterior tibial and dorsalis pedis pulses; 2+ left femoral, posterior tibial and dorsalis pedis pulses; no subclavian or femoral bruits Neurological: grossly nonfocal Psych: euthymic mood, full affect   EKG:  EKG is not ordered today.   Recent Labs: No results found for requested labs within last 365 days.    Lipid Panel No results found for: CHOL, TRIG, HDL, CHOLHDL, VLDL, LDLCALC, LDLDIRECT    Wt Readings from Last 3 Encounters:  04/10/15 252 lb 9 oz (114.562 kg)  12/09/14 249 lb (112.946 kg)  12/17/13 258 lb 6.4 oz (117.209 kg)     ASSESSMENT AND PLAN:  1.  Recurrent asymptomatic paroxysmal atrial fibrillation was spontaneously controlled ventricular rate.CHADSVasc score is 2 (age, HTN)  And the bleeding risk and stroke benefit with oral anticoagulants is roughly matched. We discussed the pros and cons of more aggressive stroke prevention therapy your thyroid recommend that he start a direct oral anticoagulant.  2.  Symptomatic sinus bradycardia with normally functioning dual-chamber permanent pacemaker  3.  Essential hypertension, controlled   he had laboratory tests with Dr. Ashby Dawes. Will retrieve these to make  sure he has normal renal function before he actually starts the anticoagulant  Current medicines are reviewed at length with the patient today.  The patient does not have concerns regarding medicines.  The following changes have been made:  Start Xarelto 20 mg with supper daily, stop aspirin  Labs/ tests ordered today include:  No orders of the defined types were placed in this encounter.    Patient Instructions  Your physician has recommended you make the following change in your medication: START XARELTO 20 MG AT BEDTIME.  STOP THE ASPIRIN  Dr. Sallyanne Kuster recommends that you schedule a follow-up appointment in: [place] [facility]        Signed, Sanda Klein, MD  04/11/2015 10:45 AM    Sanda Klein, MD, [facility] [phone] office [phone] pager

## 2015-04-10 NOTE — Patient Instructions (Addendum)
Your physician has recommended you make the following change in your medication: START XARELTO 20 MG AT BEDTIME.  STOP THE ASPIRIN  Dr. Sallyanne Kuster recommends that you schedule a follow-up appointment in: 3 [place] ([place])

## 2015-04-11 ENCOUNTER — Encounter: Payer: Self-pay | Admitting: Cardiovascular Disease

## 2015-05-04 ENCOUNTER — Encounter: Payer: Self-pay | Admitting: Cardiovascular Disease

## 2015-05-05 LAB — CUP PACEART INCLINIC DEVICE CHECK
Implantable Lead Implant Date: 20130708
Implantable Lead Location: 753859
Implantable Lead Location: 753860
Implantable Lead Serial Number: 29021711
Lead Channel Setting Pacing Amplitude: 2 V
Lead Channel Setting Pacing Amplitude: 2.4 V
MDC IDC LEAD IMPLANT DT: 20130708
MDC IDC LEAD MODEL: 4136
MDC IDC LEAD MODEL: 4137
MDC IDC LEAD SERIAL: 29201221
MDC IDC PG SERIAL: 112155
MDC IDC SESS DTM: 20161206165908
MDC IDC SET LEADCHNL RV PACING PULSEWIDTH: 0.6 ms
MDC IDC SET LEADCHNL RV SENSING SENSITIVITY: 2.5 mV

## 2015-07-08 ENCOUNTER — Ambulatory Visit (INDEPENDENT_AMBULATORY_CARE_PROVIDER_SITE_OTHER): Payer: PPO | Admitting: Cardiovascular Disease

## 2015-07-08 ENCOUNTER — Encounter: Payer: Self-pay | Admitting: Cardiovascular Disease

## 2015-07-08 VITALS — BP 145/90 | HR 71 | Ht 72.0 in | Wt 250.0 lb

## 2015-07-08 DIAGNOSIS — I10 Essential (primary) hypertension: Secondary | ICD-10-CM

## 2015-07-08 DIAGNOSIS — Z950 Presence of cardiac pacemaker: Secondary | ICD-10-CM

## 2015-07-08 DIAGNOSIS — I472 Ventricular tachycardia: Secondary | ICD-10-CM

## 2015-07-08 DIAGNOSIS — R001 Bradycardia, unspecified: Secondary | ICD-10-CM

## 2015-07-08 DIAGNOSIS — I4729 Other ventricular tachycardia: Secondary | ICD-10-CM

## 2015-07-08 DIAGNOSIS — I480 Paroxysmal atrial fibrillation: Secondary | ICD-10-CM
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
## 2015-07-08 NOTE — Patient Instructions (Signed)
Dr. Sallyanne Kuster recommends that you schedule a follow-up appointment in: 3 MONTHS WITH PACEMAKER CHECK ([place] TO OFFICE).

## 2015-07-09 ENCOUNTER — Encounter: Payer: Self-pay | Admitting: Cardiovascular Disease

## 2015-07-17 ENCOUNTER — Telehealth: Payer: Self-pay | Admitting: Cardiovascular Disease

## 2015-07-17 NOTE — Telephone Encounter (Signed)
SPOKE TO PATIENT PATIENT STATES YESTERDAY HE WOKE UP AND HAD BLOOD ALLOVER HIS FOREARM ,CHEST  AND BED LINEN PATIENT STATES HE HAD A SCRATCH ON HIS  ARM,WHICH HE CLEANED UP. HE STATES AFTERWARD HE FELT LIKE HE WAS GOING TO FAINT AFTERWARD WHILE COMING OUT OF THE SHOWER. DID NOT CHECK BLOOD PRESSURE ,MACHINE WAS NOT WORKING. NO SYMPTOMS TODAY.  RN INFORMED SUGGEST TO PURCHASE A BLOOD PRESSURE TO MONITOR,  BE CAREFUL WITH BRUISING AND SCRATCHES - APPLY PRESSURE ,WILL BLEED EASIER WHILE TAKING XARELTO NOT SURE WHY PATIENT HAD EPISODE "FAINT" ,POSSIBLY ANXIOUS FOR SEEING THE BLOOD,VAGUE RESPONSE. PATIENT WILL CONTINUE TO MONITOR AND CONTACT OFFICE IF NEEDED

## 2015-07-17 NOTE — Telephone Encounter (Signed)
New message    Patient calling    Pt c/o medication issue:  1. Name of Medication: xarelto 20 mg   2. How are you currently taking this medication (dosage and times per day)?  Taken in evening  - dinner time   3. Are you having a reaction (difficulty breathing--STAT)? Arm bleeding early Thursday morning . Elbow / upper arm . Patient states no open wounds.    4. What is your medication issue?  Wants to discuss

## 2015-07-23 DIAGNOSIS — I10 Essential (primary) hypertension: Secondary | ICD-10-CM | POA: Diagnosis not present

## 2015-08-25 DIAGNOSIS — M50323 Other cervical disc degeneration at C6-C7 level: Secondary | ICD-10-CM | POA: Diagnosis not present

## 2015-08-25 DIAGNOSIS — M542 Cervicalgia: Secondary | ICD-10-CM | POA: Diagnosis not present

## 2015-09-04 DIAGNOSIS — M542 Cervicalgia: Secondary | ICD-10-CM | POA: Diagnosis not present

## 2015-09-08 DIAGNOSIS — R7301 Impaired fasting glucose: Secondary | ICD-10-CM | POA: Diagnosis not present

## 2015-09-08 DIAGNOSIS — M1A0791 Idiopathic chronic gout, unspecified ankle and foot, with tophus (tophi): Secondary | ICD-10-CM | POA: Diagnosis not present

## 2015-09-08 DIAGNOSIS — E782 Mixed hyperlipidemia: Secondary | ICD-10-CM | POA: Diagnosis not present

## 2015-09-08 DIAGNOSIS — I10 Essential (primary) hypertension: Secondary | ICD-10-CM | POA: Diagnosis not present

## 2015-09-10 DIAGNOSIS — M542 Cervicalgia: Secondary | ICD-10-CM | POA: Diagnosis not present

## 2015-09-15 DIAGNOSIS — I10 Essential (primary) hypertension: Secondary | ICD-10-CM | POA: Diagnosis not present

## 2015-09-15 DIAGNOSIS — E782 Mixed hyperlipidemia: Secondary | ICD-10-CM | POA: Diagnosis not present

## 2015-09-15 DIAGNOSIS — M542 Cervicalgia: Secondary | ICD-10-CM | POA: Diagnosis not present

## 2015-09-15 DIAGNOSIS — R7303 Prediabetes: Secondary | ICD-10-CM | POA: Diagnosis not present

## 2015-09-17 DIAGNOSIS — M542 Cervicalgia: Secondary | ICD-10-CM | POA: Diagnosis not present

## 2015-09-22 DIAGNOSIS — M542 Cervicalgia: Secondary | ICD-10-CM | POA: Diagnosis not present

## 2015-09-24 DIAGNOSIS — M542 Cervicalgia: Secondary | ICD-10-CM | POA: Diagnosis not present

## 2015-09-29 DIAGNOSIS — M542 Cervicalgia: Secondary | ICD-10-CM | POA: Diagnosis not present

## 2015-10-01 DIAGNOSIS — M542 Cervicalgia: Secondary | ICD-10-CM | POA: Diagnosis not present

## 2015-10-05 NOTE — Progress Notes (Signed)
Patient ID: MONTERIUS Wolf, male   DOB: [birth year], 70 y.o.   MRN: [id number]    Cardiology Office Note    Date:  10/06/2015   ID:  George, Wolf 03-May-1946, MRN [id number]  PCP:  Merrilee Seashore, MD  Cardiologist:   Sanda Klein, MD   Chief Complaint  Patient presents with  . Follow-up    pacer check, no chest discomfort    History of Present Illness:  George Wolf is a 70 y.o. male infrequent asymptomatic paroxysmal atrial fibrillation, asymptomatic nonsustained ventricular tachycardia and sinus node dysfunction status post dual-chamber permanent pacemaker implantation.  He feels great and his complaints are joint related (left knee and cervical spine). He is leaving for a long planned trip to the [facility] in June.  Device interrogation shows normal function with 89% atrial pacing and only 3% ventricular pacing. The heart rate histogram distribution is generally favorable may be very slightly blunted. Generator longevity is estimated at 10 years. There has been no further recorded atrial fibrillation or ventricular tachycardia and had only one very brief episode of paroxysmal atrial tachycardia. He has not had any bleeding or focal neurological events.  He had one episode of paroxysmal atrial fibrillation with spontaneously controlled ventricular rate that occurred in March 2015. Recurrent arrhythmia has been recorded since in December 2016 and January 2017 (each event roughly an hour long and asymptomatic). He has no evidence of structural heart disease by previous stress testing and echocardiography. He is currently only receiving aspirin for stroke prevention.  Past Medical History  Diagnosis Date  . Sinus node dysfunction [facility])     [employer] pacemaker 12/05/11  . Hypertension     Past Surgical History  Procedure Laterality Date  . Permanent pacemaker insertion  12/05/2011    [employer]  . Replacement total knee  03/23/2009    right  . US  echocardiography  11/01/2011    mildly dilated LA  . Permanent pacemaker insertion N/A 12/05/2011    Procedure: PERMANENT PACEMAKER INSERTION;  Surgeon: Sanda Klein, MD;  Location: [facility] CATH LAB;  Service: Cardiovascular;  Laterality: N/A;    Current Medications: Outpatient Prescriptions Prior to Visit  Medication Sig Dispense Refill  . allopurinol (ZYLOPRIM) 100 MG tablet Take 100 mg by mouth daily.  9  . atorvastatin (LIPITOR) 20 MG tablet Take 1 tablet by mouth daily. Take 1 tab daily in the evening  2  . benazepril (LOTENSIN) 20 MG tablet Take 20 mg by mouth daily.    Marland Kitchen buPROPion (WELLBUTRIN SR) 150 MG 12 hr tablet Take 150 mg by mouth 2 (two) times daily.    . clindamycin (CLEOCIN) 300 MG capsule For dental prodcures prn    . clonazePAM (KLONOPIN) 0.5 MG tablet Take 0.5 mg by mouth daily as needed.    . fexofenadine (ALLEGRA) 180 MG tablet Take 180 mg by mouth daily as needed.     . fish oil-omega-3 fatty acids 1000 MG capsule Take 1 g by mouth daily.    Marland Kitchen GLUCOSAMINE-CHONDROIT-CALCIUM PO Take 1 tablet by mouth 2 (two) times daily.    . hydrochlorothiazide (HYDRODIURIL) 25 MG tablet Take 25 mg by mouth daily.    . magnesium oxide (MAG-OX) 400 MG tablet Take 400 mg by mouth daily.    . Multiple Vitamin (MULTIVITAMIN WITH MINERALS) TABS Take 1 tablet by mouth daily.    . rivaroxaban (XARELTO) 20 MG TABS tablet Take 1 tablet (20 mg total) by mouth daily with supper. 30 tablet 6  .  sertraline (ZOLOFT) 100 MG tablet Take 150 mg by mouth daily.      No facility-administered medications prior to visit.     Allergies:   Penicillins   Social History   Social History  . Marital Status: Married    Spouse Name: N/A  . Number of Children: N/A  . Years of Education: N/A   Social History Main Topics  . Smoking status: Never Smoker   . Smokeless tobacco: Never Used  . Alcohol Use: Yes  . Drug Use: No  . Sexual Activity:    Partners: Female   Other Topics Concern  . None   Social  History Narrative     Family History:  The patient's family history includes Diabetes in his father.   ROS:   Please see the history of present illness.    ROS All other systems reviewed and are negative.   PHYSICAL EXAM:   VS:  BP 132/80 mmHg  Pulse 60  Ht 6' (1.829 m)  Wt 112.605 kg (248 lb 4 oz)  BMI 33.66 kg/m2   GEN: Well nourished, well developed, in no acute distress HEENT: normal Neck: no JVD, carotid bruits, or masses Cardiac: RRR; no murmurs, rubs, or gallops,no edema  Respiratory:  clear to auscultation bilaterally, normal work of breathing GI: soft, nontender, nondistended, + BS MS: no deformity or atrophy Skin: warm and dry, no rash Neuro:  Alert and Oriented x 3, Strength and sensation are intact Psych: euthymic mood, full affect  Wt Readings from Last 3 Encounters:  10/06/15 112.605 kg (248 lb 4 oz)  07/08/15 113.399 kg (250 lb)  04/10/15 114.562 kg (252 lb 9 oz)      Studies/Labs Reviewed:   EKG:  EKG is not ordered today.    ASSESSMENT:    1. Paroxysmal atrial fibrillation (HCC)   2. NSVT (nonsustained ventricular tachycardia) ([place])   3. Bradycardia, symptomatic   4. Pacemaker, dual-chamber [employer], sinus node dysfunction, implanted 12/05/11   5. Essential hypertension      PLAN:  In order of problems listed above:  1. Afib: no new episodes since last device check 2. NSVT: none seen recently 3. SSS: after sensor adjustment, symptoms of chronotropic incompetence improved. If his plans to "get in shape" do not lead to improvement in stamina, may consider making his has a settings a little more aggressive, based on treadmill stress testing 4. PPM: normal device function. Remote download in 3 months 5. HTN: well controlled    Medication Adjustments/Labs and Tests Ordered: Current medicines are reviewed at length with the patient today.  Concerns regarding medicines are outlined above.  Medication changes, Labs and Tests ordered today  are listed in the Patient Instructions below. Patient Instructions  Medication Instructions: Dr Sallyanne Kuster recommends that you continue on your current medications as directed. Please refer to the Current Medication list given to you today.  Labwork: NONE ORDERED  Testing/Procedures: 1. Remote Pacemaker Download - Remote monitoring is used to monitor your Pacemaker of ICD from home. This monitoring reduces the number of office visits required to check your device to one time per year. It allows [place] to keep an eye on the functioning of your device to ensure it is working properly. You are scheduled for a device check from home on Thursday, August 10th, 2017. You may send your transmission at any time that day. If you have a wireless device, the transmission will be sent automatically. After your physician reviews your transmission, you will receive a  postcard with your next transmission date.  Follow-up: Dr Sallyanne Kuster recommends that you schedule a follow-up appointment in 6 months with a pacemaker check. You will receive a reminder letter in the mail two months in advance. If you don't receive a letter, please call our office to schedule the follow-up appointment.  If you need a refill on your cardiac medications before your next appointment, please call your pharmacy.     Mikael Spray, MD  10/06/2015 4:56 PM    [facility] [place], [place], [place]  [address] Phone: [phone]; Fax: [phone]

## 2015-10-06 ENCOUNTER — Ambulatory Visit (INDEPENDENT_AMBULATORY_CARE_PROVIDER_SITE_OTHER): Payer: PPO | Admitting: Cardiovascular Disease

## 2015-10-06 ENCOUNTER — Encounter: Payer: Self-pay | Admitting: Cardiovascular Disease

## 2015-10-06 VITALS — BP 132/80 | HR 60 | Ht 72.0 in | Wt 248.2 lb

## 2015-10-06 DIAGNOSIS — I472 Ventricular tachycardia: Secondary | ICD-10-CM

## 2015-10-06 DIAGNOSIS — I480 Paroxysmal atrial fibrillation: Secondary | ICD-10-CM

## 2015-10-06 DIAGNOSIS — Z950 Presence of cardiac pacemaker: Secondary | ICD-10-CM | POA: Diagnosis not present

## 2015-10-06 DIAGNOSIS — R001 Bradycardia, unspecified: Secondary | ICD-10-CM | POA: Diagnosis not present

## 2015-10-06 DIAGNOSIS — I10 Essential (primary) hypertension: Secondary | ICD-10-CM

## 2015-10-06 DIAGNOSIS — I4729 Other ventricular tachycardia: Secondary | ICD-10-CM

## 2015-10-06 NOTE — Patient Instructions (Signed)
Medication Instructions: Dr Sallyanne Kuster recommends that you continue on your current medications as directed. Please refer to the Current Medication list given to you today.  Labwork: NONE ORDERED  Testing/Procedures: 1. Remote Pacemaker Download - Remote monitoring is used to monitor your Pacemaker of ICD from home. This monitoring reduces the number of office visits required to check your device to one time per year. It allows [place] to keep an eye on the functioning of your device to ensure it is working properly. You are scheduled for a device check from home on Thursday, August 10th, 2017. You may send your transmission at any time that day. If you have a wireless device, the transmission will be sent automatically. After your physician reviews your transmission, you will receive a postcard with your next transmission date.  Follow-up: Dr Sallyanne Kuster recommends that you schedule a follow-up appointment in 6 months with a pacemaker check. You will receive a reminder letter in the mail two months in advance. If you don't receive a letter, please call our office to schedule the follow-up appointment.  If you need a refill on your cardiac medications before your next appointment, please call your pharmacy.

## 2015-10-07 DIAGNOSIS — M1712 Unilateral primary osteoarthritis, left knee: Secondary | ICD-10-CM | POA: Diagnosis not present

## 2015-10-17 LAB — CUP PACEART INCLINIC DEVICE CHECK
Battery Remaining Longevity: 108 mo
Battery Remaining Percentage: 100 %
Brady Statistic RV Percent Paced: 3 %
Implantable Lead Implant Date: 20130708
Implantable Lead Location: 753859
Implantable Lead Location: 753860
Implantable Lead Serial Number: 29021711
Implantable Lead Serial Number: 29201221
Lead Channel Impedance Value: 605 Ohm
Lead Channel Pacing Threshold Pulse Width: 0.4 ms
Lead Channel Setting Pacing Amplitude: 2 V
Lead Channel Setting Pacing Amplitude: 2.4 V
MDC IDC LEAD IMPLANT DT: 20130708
MDC IDC LEAD MODEL: 4136
MDC IDC LEAD MODEL: 4137
MDC IDC MSMT LEADCHNL RA PACING THRESHOLD AMPLITUDE: 0.6 V
MDC IDC MSMT LEADCHNL RV IMPEDANCE VALUE: 577 Ohm
MDC IDC PG SERIAL: 112155
MDC IDC SESS DTM: 20170509141300
MDC IDC SET LEADCHNL RV PACING PULSEWIDTH: 0.6 ms
MDC IDC SET LEADCHNL RV SENSING SENSITIVITY: 2.5 mV
MDC IDC STAT BRADY RA PERCENT PACED: 89 %

## 2015-10-27 LAB — CUP PACEART INCLINIC DEVICE CHECK
Battery Remaining Longevity: 114 mo
Date Time Interrogation Session: 20170208154700
Implantable Lead Implant Date: 20130708
Implantable Lead Location: 753860
Implantable Lead Model: 4136
Implantable Lead Model: 4137
Implantable Lead Serial Number: 29021711
Implantable Lead Serial Number: 29201221
Lead Channel Impedance Value: 617 Ohm
Lead Channel Impedance Value: 641 Ohm
Lead Channel Pacing Threshold Amplitude: 0.6 V
Lead Channel Pacing Threshold Pulse Width: 0.4 ms
Lead Channel Setting Sensing Sensitivity: 2.5 mV
MDC IDC LEAD IMPLANT DT: 20130708
MDC IDC LEAD LOCATION: 753859
MDC IDC MSMT BATTERY REMAINING PERCENTAGE: 100 %
MDC IDC SET LEADCHNL RA PACING AMPLITUDE: 2 V
MDC IDC SET LEADCHNL RV PACING AMPLITUDE: 2.4 V
MDC IDC SET LEADCHNL RV PACING PULSEWIDTH: 0.6 ms
MDC IDC STAT BRADY RA PERCENT PACED: 89 %
MDC IDC STAT BRADY RV PERCENT PACED: 3 %
Pulse Gen Serial Number: 112155

## 2015-10-30 ENCOUNTER — Encounter: Payer: Self-pay | Admitting: Cardiovascular Disease

## 2015-12-14 DIAGNOSIS — M25552 Pain in left hip: Secondary | ICD-10-CM | POA: Diagnosis not present

## 2016-01-07 ENCOUNTER — Ambulatory Visit (INDEPENDENT_AMBULATORY_CARE_PROVIDER_SITE_OTHER): Payer: PPO | Admitting: *Deleted

## 2016-01-07 DIAGNOSIS — R001 Bradycardia, unspecified: Secondary | ICD-10-CM

## 2016-01-07 DIAGNOSIS — Z950 Presence of cardiac pacemaker: Secondary | ICD-10-CM | POA: Diagnosis not present

## 2016-01-07 NOTE — Progress Notes (Signed)
Remote pacemaker transmission.   

## 2016-01-12 ENCOUNTER — Encounter: Payer: Self-pay | Admitting: Cardiology

## 2016-01-13 LAB — CUP PACEART REMOTE DEVICE CHECK
Brady Statistic RA Percent Paced: 88 %
Brady Statistic RV Percent Paced: 3 %
Date Time Interrogation Session: 20170810054100
Implantable Lead Implant Date: 20130708
Implantable Lead Location: 753859
Implantable Lead Model: 4137
Lead Channel Setting Pacing Amplitude: 2.4 V
Lead Channel Setting Pacing Pulse Width: 0.6 ms
Lead Channel Setting Sensing Sensitivity: 2.5 mV
MDC IDC LEAD IMPLANT DT: 20130708
MDC IDC LEAD LOCATION: 753860
MDC IDC LEAD MODEL: 4136
MDC IDC LEAD SERIAL: 29021711
MDC IDC LEAD SERIAL: 29201221
MDC IDC MSMT BATTERY REMAINING LONGEVITY: 108 mo
MDC IDC MSMT BATTERY REMAINING PERCENTAGE: 100 %
MDC IDC MSMT LEADCHNL RA IMPEDANCE VALUE: 632 Ohm
MDC IDC MSMT LEADCHNL RV IMPEDANCE VALUE: 620 Ohm
MDC IDC SET LEADCHNL RA PACING AMPLITUDE: 2 V
Pulse Gen Serial Number: 112155

## 2016-01-27 ENCOUNTER — Encounter: Payer: Self-pay | Admitting: Cardiology

## 2016-02-02 ENCOUNTER — Other Ambulatory Visit: Payer: Self-pay | Admitting: Cardiovascular Disease

## 2016-03-07 ENCOUNTER — Other Ambulatory Visit: Payer: Self-pay | Admitting: Cardiovascular Disease

## 2016-03-22 DIAGNOSIS — I10 Essential (primary) hypertension: Secondary | ICD-10-CM | POA: Diagnosis not present

## 2016-03-22 DIAGNOSIS — R7303 Prediabetes: Secondary | ICD-10-CM | POA: Diagnosis not present

## 2016-03-22 DIAGNOSIS — E782 Mixed hyperlipidemia: Secondary | ICD-10-CM | POA: Diagnosis not present

## 2016-03-24 ENCOUNTER — Telehealth: Payer: Self-pay | Admitting: Cardiovascular Disease

## 2016-03-24 NOTE — Telephone Encounter (Signed)
Please give the pt a call --he's in the doughnut hole w/ his XARELTO 20 MG TABS tablet [id number]  Please give him a call @ [phone]

## 2016-03-24 NOTE — Telephone Encounter (Signed)
Patient aware samples at front desk for pickup.  Medication Samples have been provided to the patient.  Drug name: XareltoQty: 28LOT: [id number].Date: 2/20  The patient has been instructed regarding the correct time, dose, and frequency of taking this medication, including desired effects and most common side effects.

## 2016-03-29 DIAGNOSIS — Z23 Encounter for immunization: Secondary | ICD-10-CM | POA: Diagnosis not present

## 2016-03-29 DIAGNOSIS — M150 Primary generalized (osteo)arthritis: Secondary | ICD-10-CM | POA: Diagnosis not present

## 2016-03-29 DIAGNOSIS — E782 Mixed hyperlipidemia: Secondary | ICD-10-CM | POA: Diagnosis not present

## 2016-03-29 DIAGNOSIS — R319 Hematuria, unspecified: Secondary | ICD-10-CM | POA: Diagnosis not present

## 2016-03-29 DIAGNOSIS — M1A0791 Idiopathic chronic gout, unspecified ankle and foot, with tophus (tophi): Secondary | ICD-10-CM | POA: Diagnosis not present

## 2016-04-19 ENCOUNTER — Ambulatory Visit (INDEPENDENT_AMBULATORY_CARE_PROVIDER_SITE_OTHER): Payer: PPO | Admitting: Cardiovascular Disease

## 2016-04-19 ENCOUNTER — Encounter: Payer: Self-pay | Admitting: Cardiovascular Disease

## 2016-04-19 VITALS — BP 135/87 | HR 73 | Ht 72.0 in | Wt 238.6 lb

## 2016-04-19 DIAGNOSIS — I495 Sick sinus syndrome: Secondary | ICD-10-CM | POA: Diagnosis not present

## 2016-04-19 DIAGNOSIS — I10 Essential (primary) hypertension: Secondary | ICD-10-CM

## 2016-04-19 DIAGNOSIS — I472 Ventricular tachycardia: Secondary | ICD-10-CM

## 2016-04-19 DIAGNOSIS — I480 Paroxysmal atrial fibrillation: Secondary | ICD-10-CM | POA: Diagnosis not present

## 2016-04-19 DIAGNOSIS — Z950 Presence of cardiac pacemaker: Secondary | ICD-10-CM

## 2016-04-19 DIAGNOSIS — E669 Obesity, unspecified: Secondary | ICD-10-CM

## 2016-04-19 DIAGNOSIS — E66811 Obesity, class 1: Secondary | ICD-10-CM

## 2016-04-19 DIAGNOSIS — I4729 Other ventricular tachycardia: Secondary | ICD-10-CM

## 2016-04-19 LAB — CUP PACEART INCLINIC DEVICE CHECK
Battery Remaining Longevity: 108 mo
Brady Statistic RV Percent Paced: 3 %
Implantable Lead Implant Date: 20130708
Implantable Lead Implant Date: 20130708
Implantable Lead Location: 753860
Implantable Lead Model: 4136
Implantable Lead Model: 4137
Implantable Lead Serial Number: 29021711
Implantable Pulse Generator Implant Date: 20130708
Lead Channel Impedance Value: 616 Ohm
Lead Channel Pacing Threshold Amplitude: 0.6 V
Lead Channel Pacing Threshold Pulse Width: 0.4 ms
Lead Channel Setting Pacing Pulse Width: 0.6 ms
Lead Channel Setting Sensing Sensitivity: 2.5 mV
MDC IDC LEAD LOCATION: 753859
MDC IDC LEAD SERIAL: 29201221
MDC IDC MSMT BATTERY REMAINING PERCENTAGE: 100 %
MDC IDC MSMT LEADCHNL RA IMPEDANCE VALUE: 652 Ohm
MDC IDC PG SERIAL: 112155
MDC IDC SESS DTM: 20171121093508
MDC IDC SET LEADCHNL RA PACING AMPLITUDE: 2 V
MDC IDC SET LEADCHNL RV PACING AMPLITUDE: 2.4 V
MDC IDC STAT BRADY RA PERCENT PACED: 88 %

## 2016-04-19 NOTE — Progress Notes (Signed)
Cardiology Office Note    Date:  04/19/2016   ID:  George Wolf, George Wolf December 23, 1945, MRN [id number]  PCP:  Merrilee Seashore, MD  Cardiologist:   Sanda Klein, MD   Chief Complaint  Patient presents with  . Follow-up    History of Present Illness:  George Wolf is a 70 y.o. male with HTN, paroxysmal atrial fibrillation and symptomatic sinus node dysfunction who received a dual-chamber permanent pacemaker in 2013 and returns for follow-up. He does not have significant structural heart disease. He denies problems with syncope, palpitations, dyspnea, angina or focal neurological complaints. [place]  Interrogation of his pacemaker shows normal device function. Battery status is good. There is 88% atrial pacing with excellent heart rate histogram distribution. There is only 3% ventricular pacing. The burden of atrial fibrillation has been very low for a total of only 1.7 hours throughout the last 12 months. All the recent episodes have been extremely brief. Similarly, he has a very low frequency of brief episodes of nonsustained VT.  Borderline elevated blood pressure today, his blood pressure was 120/78 when he recently saw Dr. Ashby Dawes. Labs performed at Dr. Theodosia Blender office were normal per George Wolf.  Past Medical History:  Diagnosis Date  . Hypertension   . Sinus node dysfunction [facility])    [employer] pacemaker 12/05/11    Past Surgical History:  Procedure Laterality Date  . PERMANENT PACEMAKER INSERTION  12/05/2011   [employer]  . PERMANENT PACEMAKER INSERTION N/A 12/05/2011   Procedure: PERMANENT PACEMAKER INSERTION;  Surgeon: Sanda Klein, MD;  Location: [facility] CATH LAB;  Service: Cardiovascular;  Laterality: N/A;  . REPLACEMENT TOTAL KNEE  03/23/2009   right  . US ECHOCARDIOGRAPHY  11/01/2011   mildly dilated LA    Current Medications: Outpatient Medications Prior to Visit    Medication Sig Dispense Refill  . allopurinol (ZYLOPRIM) 100 MG tablet Take 100 mg by mouth daily.  9  . atorvastatin (LIPITOR) 20 MG tablet Take 1 tablet by mouth daily. Take 1 tab daily in the evening  2  . benazepril (LOTENSIN) 20 MG tablet Take 20 mg by mouth daily.    Marland Kitchen buPROPion (WELLBUTRIN SR) 150 MG 12 hr tablet Take 150 mg by mouth 2 (two) times daily.    . clindamycin (CLEOCIN) 300 MG capsule For dental prodcures prn    . clonazePAM (KLONOPIN) 0.5 MG tablet Take 0.5 mg by mouth daily as needed.    . fexofenadine (ALLEGRA) 180 MG tablet Take 180 mg by mouth daily as needed.     . fish oil-omega-3 fatty acids 1000 MG capsule Take 1 g by mouth daily.    Marland Kitchen GLUCOSAMINE-CHONDROIT-CALCIUM PO Take 1 tablet by mouth 2 (two) times daily.    . hydrochlorothiazide (HYDRODIURIL) 25 MG tablet Take 25 mg by mouth daily.    . magnesium oxide (MAG-OX) 400 MG tablet Take 400 mg by mouth daily.    . Multiple Vitamin (MULTIVITAMIN WITH MINERALS) TABS Take 1 tablet by mouth daily.    . sertraline (ZOLOFT) 100 MG tablet Take 150 mg by mouth daily.     George Wolf 20 MG TABS tablet TAKE 1 TABLET(20 MG) BY MOUTH DAILY WITH DINNER 30 tablet 5   No facility-administered medications prior to visit.      Allergies:   Penicillins   Social History   Social History  . Marital status: Married    Spouse  name: N/A  . Number of children: N/A  . Years of education: N/A   Social History Main Topics  . Smoking status: Never Smoker  . Smokeless tobacco: Never Used  . Alcohol use Yes  . Drug use: No  . Sexual activity: Yes    Partners: Female   Other Topics Concern  . None   Social History Narrative  . None     Family History:  The patient's family history includes Diabetes in his father.   ROS:   Please see the history of present illness.    ROS All other systems reviewed and are negative.   PHYSICAL EXAM:   VS:  BP 135/87 (BP Location: Left Arm, Patient Position: Sitting, Cuff Size: Normal)    Pulse 73   Ht 6' (1.829 m)   Wt 238 lb 9.6 oz (108.2 kg)   SpO2 97%   BMI 32.36 kg/m    GEN: Well nourished, well developed, in no acute distress  HEENT: normal  Neck: no JVD, carotid bruits, or masses Cardiac: RRR; no murmurs, rubs, or gallops,no edema , healthy left subclavian pacemaker site Respiratory:  clear to auscultation bilaterally, normal work of breathing GI: soft, nontender, nondistended, + BS MS: no deformity or atrophy  Skin: warm and dry, no rash Neuro:  Alert and Oriented x 3, Strength and sensation are intact Psych: euthymic mood, full affect  Wt Readings from Last 3 Encounters:  04/19/16 238 lb 9.6 oz (108.2 kg)  10/06/15 248 lb 4 oz (112.6 kg)  07/08/15 250 lb (113.4 kg)      Studies/Labs Reviewed:   EKG:  EKG is ordered today.  The ekg ordered today demonstrates Atrial paced, ventricular sensed rhythm, very minor nonspecific intraventricular conduction delay, QTC 427 ms   ASSESSMENT:    1. Paroxysmal atrial fibrillation (HCC)   2. SSS (sick sinus syndrome) ([place])   3. Pacemaker   4. NSVT (nonsustained ventricular tachycardia) ([place])   5. Essential hypertension   6. Obesity (BMI 30.0-34.9)      PLAN:  In order of problems listed above:  1. AFib: Prevalence is very low. CHADSVasc 2 (age, HTN). On Xarelto. 2. SSS: No symptoms of chronotropic incompetence, heart rate histograms are fair. 3. PPM: Normal device function. Remote download was every 3 months or it office visit yearly. 4. NSVT: Rare and asymptomatic events recorded by his device. 5. HTN: Well-controlled 6. Obesity: Discussed ways to lose weight via a more physical exercise and reduced calorie intake.    Medication Adjustments/Labs and Tests Ordered: Current medicines are reviewed at length with the patient today.  Concerns regarding medicines are outlined above.  Medication changes, Labs and Tests ordered today are listed in the Patient Instructions below. Patient Instructions  Dr  Sallyanne Kuster recommends that you continue on your current medications as directed. Please refer to the Current Medication list given to you today.  Remote monitoring is used to monitor your Pacemaker of ICD from home. This monitoring reduces the number of office visits required to check your device to one time per year. It allows [place] to keep an eye on the functioning of your device to ensure it is working properly. You are scheduled for a device check from home on Tuesday, February 20th, 2018. You may send your transmission at any time that day. If you have a wireless device, the transmission will be sent automatically. After your physician reviews your transmission, you will receive a postcard with your next transmission date.  Dr Sallyanne Kuster recommends  that you schedule a follow-up appointment in 12 months with a pacemaker check. You will receive a reminder letter in the mail two months in advance. If you don't receive a letter, please call our office to schedule the follow-up appointment.  If you need a refill on your cardiac medications before your next appointment, please call your pharmacy.    Signed, Sanda Klein, MD  04/19/2016 9:08 AM    [facility] [place], [place], [place]  [address] Phone: [phone]; Fax: [phone]

## 2016-04-19 NOTE — Patient Instructions (Signed)
Dr Sallyanne Kuster recommends that you continue on your current medications as directed. Please refer to the Current Medication list given to you today.  Remote monitoring is used to monitor your Pacemaker of ICD from home. This monitoring reduces the number of office visits required to check your device to one time per year. It allows [place] to keep an eye on the functioning of your device to ensure it is working properly. You are scheduled for a device check from home on Tuesday, February 20th, 2018. You may send your transmission at any time that day. If you have a wireless device, the transmission will be sent automatically. After your physician reviews your transmission, you will receive a postcard with your next transmission date.  Dr Sallyanne Kuster recommends that you schedule a follow-up appointment in 12 months with a pacemaker check. You will receive a reminder letter in the mail two months in advance. If you don't receive a letter, please call our office to schedule the follow-up appointment.  If you need a refill on your cardiac medications before your next appointment, please call your pharmacy.

## 2016-04-20 DIAGNOSIS — I495 Sick sinus syndrome: Secondary | ICD-10-CM | POA: Insufficient documentation

## 2016-04-26 ENCOUNTER — Other Ambulatory Visit: Payer: Self-pay | Admitting: Cardiovascular Disease

## 2016-04-26 MED ORDER — RIVAROXABAN 20 MG PO TABS: ORAL_TABLET | ORAL | 5 refills | Status: DC

## 2016-04-26 NOTE — Telephone Encounter (Signed)
Spoke with pt, aware no samples available. 

## 2016-04-26 NOTE — Telephone Encounter (Signed)
Patient calling the office for samples of medication: ° ° °1.  What medication and dosage are you requesting samples for? Xarelto 20 mg ° °2.  Are you currently out of this medication? yes ° ° °

## 2016-04-27 ENCOUNTER — Encounter: Payer: Self-pay | Admitting: Cardiovascular Disease

## 2016-06-08 DIAGNOSIS — M1712 Unilateral primary osteoarthritis, left knee: Secondary | ICD-10-CM | POA: Diagnosis not present

## 2016-06-24 DIAGNOSIS — Z23 Encounter for immunization: Secondary | ICD-10-CM | POA: Diagnosis not present

## 2016-06-24 DIAGNOSIS — D485 Neoplasm of uncertain behavior of skin: Secondary | ICD-10-CM | POA: Diagnosis not present

## 2016-06-24 DIAGNOSIS — D0439 Carcinoma in situ of skin of other parts of face: Secondary | ICD-10-CM | POA: Diagnosis not present

## 2016-06-24 DIAGNOSIS — Z85828 Personal history of other malignant neoplasm of skin: Secondary | ICD-10-CM | POA: Diagnosis not present

## 2016-07-19 ENCOUNTER — Ambulatory Visit (INDEPENDENT_AMBULATORY_CARE_PROVIDER_SITE_OTHER): Payer: PPO | Admitting: *Deleted

## 2016-07-19 DIAGNOSIS — I495 Sick sinus syndrome: Secondary | ICD-10-CM

## 2016-07-19 NOTE — Progress Notes (Signed)
Remote pacemaker transmission.   

## 2016-07-20 ENCOUNTER — Encounter: Payer: Self-pay | Admitting: Cardiology

## 2016-07-20 LAB — CUP PACEART REMOTE DEVICE CHECK
Battery Remaining Longevity: 102 mo
Battery Remaining Percentage: 100 %
Brady Statistic RV Percent Paced: 3 %
Implantable Lead Implant Date: 20130708
Implantable Lead Implant Date: 20130708
Implantable Lead Model: 4136
Implantable Lead Serial Number: 29021711
Implantable Pulse Generator Implant Date: 20130708
Lead Channel Impedance Value: 577 Ohm
Lead Channel Pacing Threshold Pulse Width: 0.4 ms
Lead Channel Setting Pacing Amplitude: 2.4 V
Lead Channel Setting Pacing Pulse Width: 0.6 ms
Lead Channel Setting Sensing Sensitivity: 2.5 mV
MDC IDC LEAD LOCATION: 753859
MDC IDC LEAD LOCATION: 753860
MDC IDC LEAD SERIAL: 29201221
MDC IDC MSMT LEADCHNL RA IMPEDANCE VALUE: 641 Ohm
MDC IDC MSMT LEADCHNL RA PACING THRESHOLD AMPLITUDE: 0.6 V
MDC IDC PG SERIAL: 112155
MDC IDC SESS DTM: 20180220172300
MDC IDC SET LEADCHNL RA PACING AMPLITUDE: 2 V
MDC IDC STAT BRADY RA PERCENT PACED: 87 %

## 2016-08-03 ENCOUNTER — Encounter: Payer: Self-pay | Admitting: Cardiology

## 2016-09-22 DIAGNOSIS — D235 Other benign neoplasm of skin of trunk: Secondary | ICD-10-CM | POA: Diagnosis not present

## 2016-10-18 ENCOUNTER — Ambulatory Visit (INDEPENDENT_AMBULATORY_CARE_PROVIDER_SITE_OTHER): Payer: PPO | Admitting: *Deleted

## 2016-10-18 DIAGNOSIS — I495 Sick sinus syndrome: Secondary | ICD-10-CM | POA: Diagnosis not present

## 2016-10-18 NOTE — Progress Notes (Signed)
Remote pacemaker transmission.   

## 2016-10-19 ENCOUNTER — Encounter: Payer: Self-pay | Admitting: Cardiology

## 2016-10-20 LAB — CUP PACEART REMOTE DEVICE CHECK
Implantable Lead Implant Date: 20130708
Implantable Lead Location: 753859
Implantable Lead Model: 4136
Implantable Lead Model: 4137
Implantable Lead Serial Number: 29021711
Implantable Lead Serial Number: 29201221
Lead Channel Impedance Value: 587 Ohm
Lead Channel Sensing Intrinsic Amplitude: 2.8 mV
MDC IDC LEAD IMPLANT DT: 20130708
MDC IDC LEAD LOCATION: 753860
MDC IDC MSMT LEADCHNL RA IMPEDANCE VALUE: 634 Ohm
MDC IDC MSMT LEADCHNL RV SENSING INTR AMPL: 9.1 mV
MDC IDC PG IMPLANT DT: 20130708
MDC IDC SESS DTM: 20180524115128
MDC IDC STAT BRADY RA PERCENT PACED: 86 %
MDC IDC STAT BRADY RV PERCENT PACED: 3 %
Pulse Gen Serial Number: 112155

## 2016-11-02 ENCOUNTER — Encounter: Payer: Self-pay | Admitting: Cardiology

## 2016-11-09 ENCOUNTER — Other Ambulatory Visit: Payer: Self-pay | Admitting: Cardiovascular Disease

## 2016-11-09 NOTE — Telephone Encounter (Signed)
Needs CBC and BMP

## 2016-11-10 NOTE — Telephone Encounter (Signed)
Request sent to Dr Milus Height office

## 2016-12-06 ENCOUNTER — Emergency Department (HOSPITAL_BASED_OUTPATIENT_CLINIC_OR_DEPARTMENT_OTHER)
Admission: EM | Admit: 2016-12-06 | Discharge: 2016-12-06 | Disposition: A | Discharge status: Home / Self Care | Payer: PPO | Attending: Emergency Medicine | Admitting: Emergency Medicine

## 2016-12-06 ENCOUNTER — Emergency Department (HOSPITAL_BASED_OUTPATIENT_CLINIC_OR_DEPARTMENT_OTHER): Payer: PPO

## 2016-12-06 ENCOUNTER — Encounter (HOSPITAL_BASED_OUTPATIENT_CLINIC_OR_DEPARTMENT_OTHER): Payer: Self-pay | Admitting: Emergency Medicine

## 2016-12-06 DIAGNOSIS — Z96651 Presence of right artificial knee joint: Secondary | ICD-10-CM | POA: Insufficient documentation

## 2016-12-06 DIAGNOSIS — S66022A Laceration of long flexor muscle, fascia and tendon of left thumb at wrist and hand level, initial encounter: Secondary | ICD-10-CM | POA: Diagnosis not present

## 2016-12-06 DIAGNOSIS — S62522A Displaced fracture of distal phalanx of left thumb, initial encounter for closed fracture: Secondary | ICD-10-CM | POA: Diagnosis not present

## 2016-12-06 DIAGNOSIS — Y939 Activity, unspecified: Secondary | ICD-10-CM | POA: Diagnosis not present

## 2016-12-06 DIAGNOSIS — Z950 Presence of cardiac pacemaker: Secondary | ICD-10-CM | POA: Insufficient documentation

## 2016-12-06 DIAGNOSIS — S61022A Laceration with foreign body of left thumb without damage to nail, initial encounter: Secondary | ICD-10-CM | POA: Diagnosis not present

## 2016-12-06 DIAGNOSIS — S61012A Laceration without foreign body of left thumb without damage to nail, initial encounter: Secondary | ICD-10-CM | POA: Diagnosis not present

## 2016-12-06 DIAGNOSIS — Y929 Unspecified place or not applicable: Secondary | ICD-10-CM | POA: Diagnosis not present

## 2016-12-06 DIAGNOSIS — Z23 Encounter for immunization: Secondary | ICD-10-CM | POA: Diagnosis not present

## 2016-12-06 DIAGNOSIS — I10 Essential (primary) hypertension: Secondary | ICD-10-CM | POA: Insufficient documentation

## 2016-12-06 DIAGNOSIS — Z79899 Other long term (current) drug therapy: Secondary | ICD-10-CM | POA: Insufficient documentation

## 2016-12-06 DIAGNOSIS — Y999 Unspecified external cause status: Secondary | ICD-10-CM | POA: Insufficient documentation

## 2016-12-06 DIAGNOSIS — S6982XA Other specified injuries of left wrist, hand and finger(s), initial encounter: Secondary | ICD-10-CM | POA: Diagnosis present

## 2016-12-06 DIAGNOSIS — W312XXA Contact with powered woodworking and forming machines, initial encounter: Secondary | ICD-10-CM | POA: Diagnosis not present

## 2016-12-06 DIAGNOSIS — Z7901 Long term (current) use of anticoagulants: Secondary | ICD-10-CM | POA: Insufficient documentation

## 2016-12-06 DIAGNOSIS — S62522B Displaced fracture of distal phalanx of left thumb, initial encounter for open fracture: Secondary | ICD-10-CM

## 2016-12-06 DIAGNOSIS — S6702XA Crushing injury of left thumb, initial encounter: Secondary | ICD-10-CM | POA: Diagnosis not present

## 2016-12-06 MED ORDER — TETANUS-DIPHTH-ACELL PERTUSSIS 5-2.5-18.5 LF-MCG/0.5 IM SUSP
0.5000 mL | Freq: Once | INTRAMUSCULAR | Status: AC
  Administered 2016-12-06: 0.5 mL via INTRAMUSCULAR
  Filled 2016-12-06: qty 0.5

## 2016-12-06 MED ORDER — BUPIVACAINE HCL 0.25 % IJ SOLN: 20.0000 mL | Freq: Once | INTRAMUSCULAR | Status: DC

## 2016-12-06 MED ORDER — CLINDAMYCIN PHOSPHATE 600 MG/50ML IV SOLN
600.0000 mg | Freq: Once | INTRAVENOUS | Status: AC
  Administered 2016-12-06: 600 mg via INTRAVENOUS
  Filled 2016-12-06: qty 50

## 2016-12-06 MED ORDER — LIDOCAINE HCL 2 % IJ SOLN
20.0000 mL | Freq: Once | INTRAMUSCULAR | Status: AC
Start: 2016-12-06 — End: 2016-12-06
  Administered 2016-12-06: 400 mg
  Filled 2016-12-06: qty 20

## 2016-12-06 MED ORDER — BUPIVACAINE HCL (PF) 0.25 % IJ SOLN
20.0000 mL | Freq: Once | INTRAMUSCULAR | Status: AC
Start: 2016-12-06 — End: 2016-12-06
  Administered 2016-12-06: 20 mL
  Filled 2016-12-06: qty 30

## 2016-12-06 NOTE — Discharge Instructions (Signed)
Please read attached information. If you experience any new or worsening signs or symptoms please return to the emergency room for evaluation. Please follow-up with your primary care provider or specialist as discussed. Please use medication prescribed only as directed and discontinue taking if you have any concerning signs or symptoms.   °

## 2016-12-06 NOTE — ED Triage Notes (Signed)
Pt reports laceration from table saw to LT thumb just PTA; bleeding controlled.

## 2016-12-06 NOTE — ED Provider Notes (Signed)
[facility] Provider Note   CSN: [id number] Arrival date & time: 12/06/16  1016     History   Chief Complaint Chief Complaint  Patient presents with  . Laceration    HPI George Wolf is a 71 y.o. male.  The history is provided by the patient and medical records.  Laceration   The incident occurred less than 1 hour ago. The laceration is located on the left hand. The laceration is 3 cm in size. The laceration mechanism was a a metal edge (table saw). The pain is at a severity of 4/10. The pain is mild. The pain has been constant since onset. It is unknown if a foreign body is present. His tetanus status is out of date.    Past Medical History:  Diagnosis Date  . Hypertension   . Sinus node dysfunction [facility])    George Wolf pacemaker 12/05/11    Patient Active Problem List   Diagnosis Date Noted  . SSS (sick sinus syndrome) ([place]) 04/20/2016  . Paroxysmal atrial fibrillation ([place]) 12/17/2013  . NSVT (nonsustained ventricular tachycardia) ([place]) 01/23/2013  . Bradycardia, symptomatic 12/06/2011  . Pacemaker, dual-chamber George Wolf, sinus node dysfunction, implanted 12/05/11 12/06/2011  . HTN (hypertension), NL LVF by echo 12/06/2011    Past Surgical History:  Procedure Laterality Date  . PERMANENT PACEMAKER INSERTION  12/05/2011   [employer]  . PERMANENT PACEMAKER INSERTION N/A 12/05/2011   Procedure: PERMANENT PACEMAKER INSERTION;  Surgeon: Sanda Klein, MD;  Location: [facility] CATH LAB;  Service: Cardiovascular;  Laterality: N/A;  . REPLACEMENT TOTAL KNEE  03/23/2009   right  . US ECHOCARDIOGRAPHY  11/01/2011   mildly dilated LA       Home Medications    Prior to Admission medications   Medication Sig Start Date End Date Taking? Authorizing Provider  allopurinol (ZYLOPRIM) 100 MG tablet Take 100 mg by mouth daily. 11/30/14   [provider]  atorvastatin (LIPITOR) 20 MG tablet Take 1 tablet by mouth daily. Take 1 tab daily in the  evening 03/11/15   [provider]  benazepril (LOTENSIN) 20 MG tablet Take 20 mg by mouth daily.    [provider]  buPROPion (WELLBUTRIN SR) 150 MG 12 hr tablet Take 150 mg by mouth 2 (two) times daily.    [provider]  clindamycin (CLEOCIN) 300 MG capsule For dental prodcures prn 02/08/15   [provider]  clonazePAM (KLONOPIN) 0.5 MG tablet Take 0.5 mg by mouth daily as needed. 11/13/12   [provider]  fexofenadine (ALLEGRA) 180 MG tablet Take 180 mg by mouth daily as needed.     [provider]  fish oil-omega-3 fatty acids 1000 MG capsule Take 1 g by mouth daily.    [provider]  GLUCOSAMINE-CHONDROIT-CALCIUM PO Take 1 tablet by mouth 2 (two) times daily.    [provider]  hydrochlorothiazide (HYDRODIURIL) 25 MG tablet Take 25 mg by mouth daily.    [provider]  magnesium oxide (MAG-OX) 400 MG tablet Take 400 mg by mouth daily.    [provider]  Multiple Vitamin (MULTIVITAMIN WITH MINERALS) TABS Take 1 tablet by mouth daily.    [provider]  sertraline (ZOLOFT) 100 MG tablet Take 150 mg by mouth daily.     [provider]  XARELTO 20 MG TABS tablet TAKE 1 TABLET(20 MG) BY MOUTH DAILY WITH DINNER 11/11/16   Croitoru, Dani Gobble, MD    Family History Family History  Problem Relation  Age of Onset  . Diabetes Father     Social History Social History  Substance Use Topics  . Smoking status: Never Smoker  . Smokeless tobacco: Never Used  . Alcohol use Yes     Allergies   Penicillins   Review of Systems Review of Systems  Constitutional: Negative for chills, diaphoresis, fatigue and fever.  HENT: Negative for congestion and rhinorrhea.   Respiratory: Negative for cough, chest tightness, shortness of breath and stridor.   Cardiovascular: Negative for chest pain and leg swelling.  Gastrointestinal: Negative for abdominal pain, constipation, diarrhea, nausea and  vomiting.  Genitourinary: Negative for dysuria.  Musculoskeletal: Negative for back pain, neck pain and neck stiffness.  Skin: Positive for wound.  Neurological: Positive for syncope. Negative for headaches.  Psychiatric/Behavioral: Negative for agitation.  All other systems reviewed and are negative.    Physical Exam Updated Vital Signs BP 134/82   Pulse 63   Temp 97.9 F (36.6 C)   Resp 20   Ht 6' (1.829 m)   Wt 106.6 kg (235 lb)   SpO2 100%   BMI 31.87 kg/m   Physical Exam  Constitutional: He is oriented to person, place, and time. He appears well-developed and well-nourished. No distress.  HENT:  Head: Normocephalic and atraumatic.  Right Ear: External ear normal.  Left Ear: External ear normal.  Nose: Nose normal.  Mouth/Throat: Oropharynx is clear and moist. No oropharyngeal exudate.  Eyes: Conjunctivae and EOM are normal. Pupils are equal, round, and reactive to light.  Neck: Normal range of motion. Neck supple.  Cardiovascular: Normal rate and intact distal pulses.   Pulmonary/Chest: Effort normal. No stridor. No respiratory distress. He has no wheezes. He exhibits no tenderness.  Abdominal: Soft. There is no tenderness. There is no rebound and no guarding.  Musculoskeletal: He exhibits tenderness. He exhibits no edema.       Left hand: He exhibits tenderness and laceration. He exhibits normal range of motion, normal capillary refill and no swelling. Normal sensation noted. Normal strength noted.       Hands: Neurological: He is alert and oriented to person, place, and time. He displays normal reflexes. No cranial nerve deficit or sensory deficit. He exhibits normal muscle tone. Coordination normal.  Skin: Skin is warm. Capillary refill takes less than 2 seconds. No rash noted. He is not diaphoretic. No erythema. No pallor.     ED Treatments / Results  Labs (all labs ordered are listed, but only abnormal results are displayed) Labs Reviewed - No data to  display  EKG  EKG Interpretation None       Radiology Dg Finger Thumb Left  Result Date: 12/06/2016 CLINICAL DATA:  Laceration of left thumb. EXAM: LEFT THUMB 2+V COMPARISON:  None. FINDINGS: Large laceration is seen involving the distal soft tissues of the thumb. Small focal density is seen consistent with debris. Moderately displaced fracture is seen involving the proximal portion of the first distal phalanx. IMPRESSION: Large soft tissue laceration seen involving the distal left thumb, with possible small focus of debris or foreign body seen. Moderately displaced fracture is seen involving the proximal portion of the first distal phalanx. Electronically Signed   By: Marijo Conception, M.D.   On: 12/06/2016 10:51    Procedures Procedures (including critical care time)  Medications Ordered in ED Medications  lidocaine (XYLOCAINE) 2 % (with pres) injection 400 mg (not administered)  bupivacaine (PF) (MARCAINE) 0.25 % injection 20 mL (not administered)  Tdap (BOOSTRIX) injection 0.5  mL (0.5 mLs Intramuscular Given 12/06/16 1103)  clindamycin (CLEOCIN) IVPB 600 mg (0 mg Intravenous Stopped 12/06/16 1201)     Initial Impression / Assessment and Plan / ED Course  I have reviewed the triage vital signs and the nursing notes.  Pertinent labs & imaging results that were available during my care of the patient were reviewed by me and considered in my medical decision making (see chart for details).     BOY DELAMATER is a 71 y.o. right handed male with a past medical history significant for atrial fibrillation on Xarelto, pacemaker placement, and hypertension who presents with left thumb injury. Patient reports that he was working in his wood shop this morning using a table saw when he injured his left thumb. He is right-handed. He had immediate onset of bleeding and pain in the left thumb. He denies any other locations of injury. He said that it continued to bleed for a short period of time  until he applied pressure. Patient was transported here by his wife and she says that whenever he gets injured, he "passes out". He says that he is in no distress and is having no chest pain, palpitations, or other complaints. She reports that he did pass out on the way here briefly. They report that the patient always "passes out" whenever he has traumatic injuries or pain. They are not concerned about the episode today.  History and exam are seen above. On exam, patient has a 3 similar laceration to the left thumb. Patient's nail appears intact. No subungual hematoma. Patient has normal sensation, and strength with the finger. Normal capillary refill. Bleeding is controlled with pressure. Normal radial pulse. No other evidence of injuries. Lungs clear and chest nontender. Abdomen nontender.  Patient with x-ray to look for open fracture.  11:25 AM X-ray obtained showing moderately displaced fracture in the proximal portion of the first distal phalanx. Also large soft tissue laceration with possible debris and foreign bodies.  Hand team was called and they recommended transfer after IV antibiotics to [facility] for their assessment, examination, and management of the wound. They recommended IV clindamycin for the open fracture.  They recommended patient be transferred POV to the emergency department for Dr. Amedeo Plenty to evaluate and manage. Anticipate emergency team calling him upon patient's arrival.  Wound will be dressed prior to self transfer. Patient's wound was hemostatic in the ED.  Patient completed his IV antibiotics and will be transferred. She'll be transferred by personal vehicle.  12:17 PM Accepting physician is Dr. Wilson Singer. Patient will be evaluated and then treated by the hand team. Patient transferred in stable condition.    Final Clinical Impressions(s) / ED Diagnoses   Final diagnoses:  Laceration of left thumb, foreign body presence unspecified, nail damage status unspecified,  initial encounter  Open displaced fracture of distal phalanx of left thumb, initial encounter     Clinical Impression: 1. Laceration of left thumb, foreign body presence unspecified, nail damage status unspecified, initial encounter   2. Open displaced fracture of distal phalanx of left thumb, initial encounter     Disposition: Transfer to [facility] by POV for hand team management  Condition: Stable      , Gwenyth Allegra, MD 12/06/16 1655

## 2016-12-06 NOTE — Consult Note (Signed)
NAME:  George Wolf, George Wolf NO.:  [id number]  MEDICAL RECORD NO.:  [id number]  LOCATION:  [facility]                         FACILITY:  [facility]  PHYSICIAN:  Satira Anis. Gramig, M.D.DATE OF BIRTH:  11-02-1945  DATE OF CONSULTATION: DATE OF DISCHARGE:                                CONSULTATION   CHIEF COMPLAINT:  Left thumb laceration.  HISTORY OF PRESENT ILLNESS:  George Wolf is an extremely pleasant 71 year old gentleman who is right-hand dominant, presents to the emergency room for evaluation of his left thumb after an injury he sustained earlier this morning.  He was making a custom table utilizing a table saw when he sustained a laceration to the left thumb fairly complex in nature. This was an oblique laceration about the volar aspect of the distal phalanx over 3 cm in nature.  He was initially seen and triaged at the [facility] via [facility] and ultimately transferred to [facility] Emergency Room for further evaluation and surgical consultation. He was previously given antibiotics in the form of clindamycin.  He tolerated this well.  He is currently comfortable.  He complains of a 2/10 pain about the thumb.  He denies any other injury.  He does have some degree of numbness about the distal tip as expects.  PAST MEDICAL HISTORY:  Significant for: 1. Hypertension. 2. Sinus node dysfunction with a Chiropractor placed     July of 2013.  He has a history of paroxysmal atrial fibrillation,     nonsustained ventricular tachycardia.  PAST SURGICAL HISTORY:  Pacemaker insertion, total knee arthroplasty in 2010 per Dr. Gaynelle Arabian.  He has had a prior ultrasound and echocardiography showing a mildly dilated left atrium.  CURRENT MEDICATIONS:  Reviewed.  He is on: 1. Allopurinol 100 mg daily. 2. Lotensin 20 mg daily. 3. Wellbutrin 150 mg by mouth 2 times daily. 4. Klonopin 0.5 mg by mouth as needed. 5. Allegra 180 mg daily as needed. 6. Fish oil  Omega-3 fatty acids 1000 mg daily. 7. Glucosamine/chondroitin 1 tablet twice daily. 8. Hydrochlorothiazide 25 mg daily. 9. Mag oxide 400 mg daily. 10.Multivitamin. 11.Xarelto 20 mg tablet 1 by mouth daily as well as Zoloft 150 mg by     mouth daily.  FAMILY MEDICAL HISTORY:  Significant for diabetes in his father.  SOCIAL HISTORY:  He is a nonsmoker and currently occasionally utilizes alcohol.  ALLERGIES:  His drug allergies are PENICILLIN.  REVIEW OF SYSTEMS:  CONSTITUTIONAL:  Negative for chills, diaphoresis, petechiae, petechiae, and fever.  HEENT:  Negative for congestion or rhinorrhea.  RESPIRATORY:  Negative for cough, shortness of breath. CARDIOVASCULAR:  Negative for chest pain.  GASTROINTESTINAL:  Negative for abdominal pain, constipation, diarrhea, nausea, vomiting. MUSCULOSKELETAL:  Please see history of present illness.  NEUROLOGIC: He does have a history of vasovagal syncope; otherwise, negative.  PHYSICAL EXAMINATION:  VITAL SIGNS:  Blood pressure is 134/82, pulse 63, temp is 97.9, respirations 20.  He is 6 feet in height.  He weighs 106.6 kg. GENERAL:  He is a delightful gentleman no acute distress, appearing stated age.  Awake, alert and oriented x3. HEENT:  Head is atraumatic, normocephalic. CHEST:  He  has equal bilateral chest expansion present. ABDOMEN:  Nontender. EXTREMITIES:  Lower extremity examination is benign.  Left upper extremity shows that he has a bulky dressing applied to the thumb.  This is gently soaked with a combination of hydrogen peroxide and saline. The dressings are then gently removed, they are noted to be fairly saturated with blood given his history of Xarelto.  Once the dressings are removed, he has an obvious complex laceration about the volar aspect of the thumb, oblique in nature, approximately 3.5 cm with a large flap present.  I should note it does appear that he does have refill about the distal tip and about the flap.  He has  diminished sensation about the distal tip to be expected given the degree of laceration.  This is at the level of the IP crease obliquely and extending distally.  EPL is intact.  FPL appears intact with active testing as well as resisted flexion.  His radiographs are reviewed and show that he has significant amount of degenerative change about the IP joint as well as the MP joint.  He has a fracture about the radial aspect of the metaphysis.  AP and lateral are reviewed.  ASSESSMENT:  Table saw injury to the left thumb with obvious soft tissue disarray and open fracture about the distal phalanx.  PLAN:  I discussed with George Wolf all issues at length and we have discussed with him the need for urgent I and D given this is an open fracture as well as repair.  PROCEDURE: 1. Digital block, left thumb. 2. Irrigation and excisional debridement about left thumb open     fracture to include skin, subcutaneous tissue and bone. 3. Flexor pollicis longus tenolysis. 4. Repair of complex laceration about the left thumb wound 3.5 cm in     nature. 5. Treatment of open fracture, left thumb distal phalanx.  PROCEDURE IN DETAIL:  I have discussed at length with George Wolf the need to proceed with surgical intervention, he understands.  We have discussed the risks and benefits at length.  We obtained verbal consent. After obtaining verbal consent, the hand was preliminarily prepped and a digital block was given about the flexor sheath and a dorsal wheal was made utilizing combination of 5 mL of xylocaine and Marcaine without epinephrine for a total of 20 mL both dorsal and volarly.  He tolerated this well.  Once appropriate anesthetic was obtained, he then underwent a copious irrigation with 3 L of saline in the depths of the wound, he tolerated this well.  The wound was then sterilely prepped and draped and standard sharp excisional debridement of the necrotic and pre- necrotic tissue and jagged  skin edges was performed.  The flap was then lifted showing debris about the wound.  Excisional debridement of skin, subcutaneous tissue was performed.  The radial aspect of the flexor pollicis longus was identified and was fairly attenuated, but overall, the majority of the tendon was intact.  Tenolysis was performed and excisional debridement was performed.  He tolerated this well.  The bone was copiously irrigated and debridement was performed.  Following this, an additional liter of saline was then used to lavage the wound. Tourniquet was taken down from the finger, he had excellent refill present.  Complex laceration repair was then performed about the wound with a combination of 4-0 chromic and 4-0 Prolene.  Following this, the patient was noted to have excellent refill.  He is in dressings followed by a bulky soft  wrap and a finger splint.  He tolerated the procedure well and there were no complicating features present.  We discussed with him the importance of elevation, edema control, keeping his wound clean, dry, and intact.  He was given doxycycline 100 mg 1 p.o. b.i.d. for 10 days for antibiotic purposes, oxycodone for pain.  We are going to see him back in our office setting in 1 week for a wound check, at which juncture, we will check the wound.  We will place him in a thumb splint for protective purposes.  We will plan for suture removal at 2 weeks.  We will keep a close watch on him, should he have any difficulties, he will contact us.  It has been a pleasure in seeing him today.  We will look forward to seeing him in our office setting.  All questions were encouraged and answered.     Avelina Laine, P.A.-C.   ______________________________ Satira Anis. Amedeo Plenty, M.D.    BB/MEDQ  D:  12/06/2016  T:  12/06/2016  Job:  [id number]

## 2016-12-13 DIAGNOSIS — S62522B Displaced fracture of distal phalanx of left thumb, initial encounter for open fracture: Secondary | ICD-10-CM | POA: Diagnosis not present

## 2016-12-21 DIAGNOSIS — S62522B Displaced fracture of distal phalanx of left thumb, initial encounter for open fracture: Secondary | ICD-10-CM | POA: Diagnosis not present

## 2016-12-21 DIAGNOSIS — M79645 Pain in left finger(s): Secondary | ICD-10-CM | POA: Diagnosis not present

## 2017-01-17 ENCOUNTER — Ambulatory Visit (INDEPENDENT_AMBULATORY_CARE_PROVIDER_SITE_OTHER): Payer: PPO | Admitting: *Deleted

## 2017-01-17 DIAGNOSIS — I495 Sick sinus syndrome: Secondary | ICD-10-CM | POA: Diagnosis not present

## 2017-01-18 NOTE — Progress Notes (Signed)
Remote pacemaker transmission.   

## 2017-01-23 DIAGNOSIS — M79645 Pain in left finger(s): Secondary | ICD-10-CM | POA: Diagnosis not present

## 2017-01-27 ENCOUNTER — Encounter: Payer: Self-pay | Admitting: Cardiology

## 2017-02-10 ENCOUNTER — Encounter: Payer: Self-pay | Admitting: Cardiology

## 2017-02-21 ENCOUNTER — Telehealth: Payer: Self-pay | Admitting: Cardiovascular Disease

## 2017-02-21 NOTE — Telephone Encounter (Signed)
New message    Patient calling the office for samples of medication:   1.  What medication and dosage are you requesting samples for?XARELTO 20 MG TABS tablet  2.  Are you currently out of this medication? yes

## 2017-02-21 NOTE — Telephone Encounter (Signed)
Spoke with pt, aware no samples available at this time. 

## 2017-03-06 ENCOUNTER — Telehealth: Payer: Self-pay | Admitting: Cardiovascular Disease

## 2017-03-06 NOTE — Telephone Encounter (Signed)
New message    Patient calling the office for samples of medication:   1.  What medication and dosage are you requesting samples for? Xarelto 20 mg  2.  Are you currently out of this medication? Just about.

## 2017-03-06 NOTE — Telephone Encounter (Signed)
Pt.notified

## 2017-03-06 NOTE — Telephone Encounter (Signed)
Samples at the front desk 

## 2017-03-07 LAB — CUP PACEART REMOTE DEVICE CHECK
Battery Remaining Longevity: 96 mo
Battery Remaining Percentage: 100 %
Brady Statistic RA Percent Paced: 88 %
Date Time Interrogation Session: 20180821054200
Implantable Lead Implant Date: 20130708
Implantable Lead Location: 753860
Implantable Lead Model: 4136
Implantable Lead Serial Number: 29201221
Implantable Pulse Generator Implant Date: 20130708
Lead Channel Impedance Value: 575 Ohm
Lead Channel Impedance Value: 635 Ohm
Lead Channel Setting Pacing Amplitude: 2 V
Lead Channel Setting Sensing Sensitivity: 2.5 mV
MDC IDC LEAD IMPLANT DT: 20130708
MDC IDC LEAD LOCATION: 753859
MDC IDC LEAD SERIAL: 29021711
MDC IDC MSMT LEADCHNL RA PACING THRESHOLD AMPLITUDE: 0.6 V
MDC IDC MSMT LEADCHNL RA PACING THRESHOLD PULSEWIDTH: 0.4 ms
MDC IDC SET LEADCHNL RV PACING AMPLITUDE: 2.4 V
MDC IDC SET LEADCHNL RV PACING PULSEWIDTH: 0.6 ms
MDC IDC STAT BRADY RV PERCENT PACED: 3 %
Pulse Gen Serial Number: 112155

## 2017-03-28 DIAGNOSIS — Z125 Encounter for screening for malignant neoplasm of prostate: Secondary | ICD-10-CM | POA: Diagnosis not present

## 2017-03-28 DIAGNOSIS — Z23 Encounter for immunization: Secondary | ICD-10-CM | POA: Diagnosis not present

## 2017-03-28 DIAGNOSIS — R319 Hematuria, unspecified: Secondary | ICD-10-CM | POA: Diagnosis not present

## 2017-03-28 DIAGNOSIS — I10 Essential (primary) hypertension: Secondary | ICD-10-CM | POA: Diagnosis not present

## 2017-03-28 DIAGNOSIS — N390 Urinary tract infection, site not specified: Secondary | ICD-10-CM | POA: Diagnosis not present

## 2017-03-28 DIAGNOSIS — Z0000 Encounter for general adult medical examination without abnormal findings: Secondary | ICD-10-CM | POA: Diagnosis not present

## 2017-03-28 DIAGNOSIS — E782 Mixed hyperlipidemia: Secondary | ICD-10-CM | POA: Diagnosis not present

## 2017-04-04 DIAGNOSIS — M150 Primary generalized (osteo)arthritis: Secondary | ICD-10-CM | POA: Diagnosis not present

## 2017-04-04 DIAGNOSIS — M1A0791 Idiopathic chronic gout, unspecified ankle and foot, with tophus (tophi): Secondary | ICD-10-CM | POA: Diagnosis not present

## 2017-04-04 DIAGNOSIS — R001 Bradycardia, unspecified: Secondary | ICD-10-CM | POA: Diagnosis not present

## 2017-04-04 DIAGNOSIS — E782 Mixed hyperlipidemia: Secondary | ICD-10-CM | POA: Diagnosis not present

## 2017-04-04 DIAGNOSIS — I10 Essential (primary) hypertension: Secondary | ICD-10-CM | POA: Diagnosis not present

## 2017-04-04 DIAGNOSIS — R7301 Impaired fasting glucose: Secondary | ICD-10-CM | POA: Diagnosis not present

## 2017-04-04 DIAGNOSIS — K429 Umbilical hernia without obstruction or gangrene: Secondary | ICD-10-CM | POA: Diagnosis not present

## 2017-04-17 ENCOUNTER — Ambulatory Visit: Payer: Self-pay | Admitting: Surgery

## 2017-04-17 DIAGNOSIS — K429 Umbilical hernia without obstruction or gangrene: Secondary | ICD-10-CM | POA: Diagnosis not present

## 2017-04-17 NOTE — H&P (Signed)
History of Present Illness George Wolf.  MD; 04/17/2017 10:17 AM) The patient is a 71 year old male who presents with an umbilical hernia. Referred by Dr. Ashby Dawes for umbilical hernia Cardiology - [facility] [place] [place]  This is a 71 year old male with a pacemaker in place anticoagulated on Xarelto who presents with a 10 year history of a slowly enlarging umbilical hernia. This has become larger and causes occasional discomfort. It is partially reducible. He denies any obstructive symptoms. The patient is awaiting scheduling of a colonoscopy by [facility]. He is now referred to [place] for surgical evaluation for repair of this umbilical hernia.   Past Surgical History Alean Rinne, [place]; 04/17/2017 9:54 AM) Knee Surgery Right.  Diagnostic Studies History Alean Rinne, [place]; 04/17/2017 9:54 AM) Colonoscopy >10 years ago  Allergies Alean Rinne, [place]; 04/17/2017 9:55 AM) No Known Drug Allergies 04/17/2017 Allergies Reconciled  Medication History Alean Rinne, RMA; 04/17/2017 9:57 AM) OxyCODONE HCl (5MG  Tablet, Oral) Active. Allopurinol (100MG  Tablet, Oral) Active. Atorvastatin Calcium (20MG  Tablet, Oral) Active. Benazepril HCl (20MG  Tablet, Oral) Active. BuPROPion HCl ER (SR) (150MG  Tablet ER 12HR, Oral) Active. HydroCHLOROthiazide (25MG  Tablet, Oral) Active. Xarelto (20MG  Tablet, Oral) Active. Sertraline HCl (100MG  Tablet, Oral) Active. Glucosamine Chondroitin Complx (Oral) Active. Magnesium (200MG  Tablet, Oral) Active. Fish Oil Omega-3 (1000MG  Capsule, Oral) Active. Allegra (180MG  Tablet, Oral) Active. Medications Reconciled  Social History Alean Rinne, [place]; 04/17/2017 9:54 AM) Alcohol use Moderate alcohol use. Caffeine use Coffee. No drug use Tobacco use Never smoker.  Family History Alean Rinne, [place]; 04/17/2017 9:54 AM) Depression Father, Mother. Diabetes Mellitus Father. Hypertension Father.  Other Problems Alean Rinne, [place];  04/17/2017 9:54 AM) Cancer Depression Gastroesophageal Reflux Disease High blood pressure     Review of Systems Alean Rinne RMA; 04/17/2017 9:54 AM) General Not Present- Appetite Loss, Chills, Fatigue, Fever, Night Sweats, Weight Gain and Weight Loss. Skin Not Present- Change in Wart/Mole, Dryness, Hives, Jaundice, New Lesions, Non-Healing Wounds, Rash and Ulcer. HEENT Present- Seasonal Allergies and Wears glasses/contact lenses. Not Present- Earache, Hearing Loss, Hoarseness, Nose Bleed, Oral Ulcers, Ringing in the Ears, Sinus Pain, Sore Throat, Visual Disturbances and Yellow Eyes. Respiratory Present- Snoring. Not Present- Bloody sputum, Chronic Cough, Difficulty Breathing and Wheezing. Breast Not Present- Breast Mass, Breast Pain, Nipple Discharge and Skin Changes. Cardiovascular Not Present- Chest Pain, Difficulty Breathing Lying Down, Leg Cramps, Palpitations, Rapid Heart Rate, Shortness of Breath and Swelling of Extremities. Gastrointestinal Present- Change in Bowel Habits and Constipation. Not Present- Abdominal Pain, Bloating, Bloody Stool, Chronic diarrhea, Difficulty Swallowing, Excessive gas, Gets full quickly at meals, Hemorrhoids, Indigestion, Nausea, Rectal Pain and Vomiting. Male Genitourinary Not Present- Blood in Urine, Change in Urinary Stream, Frequency, Impotence, Nocturia, Painful Urination, Urgency and Urine Leakage.  Vitals Alean Rinne RMA; 04/17/2017 9:55 AM) 04/17/2017 9:55 AM Weight: 247 lb Height: 72in Body Surface Area: 2.33 m Body Mass Index: 33.5 kg/m  Temp.: 98.75F  Pulse: 79 (Regular)  BP: 145/80 (Sitting, Left Arm, Standard)      Physical Exam Rodman Key K.  MD; 04/17/2017 10:18 AM)  The physical exam findings are as follows: Note:WDWN in NAD Eyes: Pupils equal, round; sclera anicteric HENT: Oral mucosa moist; good dentition Neck: No masses palpated, no thyromegaly Lungs: CTA bilaterally; normal respiratory effort CV:  Regular rate and rhythm; no murmurs; extremities well-perfused with no edema Abd: +bowel sounds, soft, non-tender, no palpable organomegaly; bulging hernia at upper edge of umbilicus - partially reducible; minimally tender Skin: Warm, dry; no sign of jaundice Psychiatric - alert and  oriented x 4; calm mood and affect    Assessment & Plan Rodman Key K.  MD; 98/92/1194 17:40 AM)  UMBILICAL HERNIA WITHOUT OBSTRUCTION OR GANGRENE (K42.9)  Current Plans Schedule for Surgery - Umbilical hernia repair with mesh. The surgical procedure has been discussed with the patient. Potential risks, benefits, alternative treatments, and expected outcomes have been explained. All of the patient's questions at this time have been answered. The likelihood of reaching the patient's treatment goal is good. The patient understand the proposed surgical procedure and wishes to proceed. Note:Cardiac clearance pending   George Wolf. Georgette Dover, MD, [facility] Surgery  General/ Trauma Surgery  04/17/2017 10:20 AM

## 2017-04-18 ENCOUNTER — Ambulatory Visit (INDEPENDENT_AMBULATORY_CARE_PROVIDER_SITE_OTHER): Payer: PPO | Admitting: *Deleted

## 2017-04-18 DIAGNOSIS — I495 Sick sinus syndrome: Secondary | ICD-10-CM

## 2017-04-19 NOTE — Progress Notes (Signed)
Remote pacemaker transmission.   

## 2017-04-24 ENCOUNTER — Encounter: Payer: Self-pay | Admitting: Physician Assistant

## 2017-04-24 ENCOUNTER — Telehealth: Payer: Self-pay | Admitting: *Deleted

## 2017-04-24 ENCOUNTER — Ambulatory Visit: Payer: PPO | Admitting: Physician Assistant

## 2017-04-24 VITALS — BP 124/76 | HR 61 | Ht 72.0 in | Wt 245.0 lb

## 2017-04-24 DIAGNOSIS — I495 Sick sinus syndrome: Secondary | ICD-10-CM

## 2017-04-24 DIAGNOSIS — I472 Ventricular tachycardia: Secondary | ICD-10-CM | POA: Diagnosis not present

## 2017-04-24 DIAGNOSIS — I480 Paroxysmal atrial fibrillation: Secondary | ICD-10-CM | POA: Diagnosis not present

## 2017-04-24 DIAGNOSIS — Z7901 Long term (current) use of anticoagulants: Secondary | ICD-10-CM | POA: Diagnosis not present

## 2017-04-24 DIAGNOSIS — Z01818 Encounter for other preprocedural examination: Secondary | ICD-10-CM | POA: Diagnosis not present

## 2017-04-24 DIAGNOSIS — I4729 Other ventricular tachycardia: Secondary | ICD-10-CM

## 2017-04-24 NOTE — Patient Instructions (Signed)
Medication Instructions:  NO CHANGES If you need a refill on your cardiac medications before your next appointment, please call your pharmacy.  Labwork: PLEASE HAVE PCP FAX LAB RESULTS TO [phone] ATTN RHONDA BARRETT, PA-C  Special Instructions: CLEARED FOR COLONOSCOPY HOLD [facility] 3 DAYS BEFORE AND RE-START AS SOON AFTER PROCEDURE AS DIRECTED BY MD.   Follow-Up: Your physician wants you to follow-up in: [place]. You should receive a reminder letter in the mail two months in advance. If you do not receive a letter, please call our office SEPTEMBER 2019 to schedule the November 2019 follow-up appointment.   Thank you for choosing [facility] at [facility]!!

## 2017-04-24 NOTE — Progress Notes (Signed)
Cardiology Office Note   Date:  04/24/2017   ID:  Josafat, Enrico [birth year], MRN [id number]  PCP:  Merrilee Seashore, MD  Cardiologist: Dr. Sallyanne Kuster, 04/19/2016 Rosaria Ferries, PA-C   Chief Complaint  Patient presents with  . Atrial Fibrillation    History of Present Illness: George Wolf is a 71 y.o. male with a history of PAF, SSS s/p BSci PPM, 88% A pacing, brief A. fib and nonsustained VT asymptomatic  George Wolf presents for cardiology follow up. He has a colonoscopy scheduled 12/05, wonders about stopping the Xarelto.  He feels well. No palpitations, no presyncope or syncope.  He is concerned that he has gained some weight. He is aware that he needs to eat less at night and cut down on snacks before bedtime.  He knows he needs to eat generally healthier.   He does not get CP or SOB w/ exertion.   He recently had to do some strenuous work around the house and garage, felt good doing this. Feels he needs to exert himself more.   No LE edema, no orthopnea or PND.   He did a download on 11/20 as scheduled.  We are not able to get this, it has not been processed yet.   Past Medical History:  Diagnosis Date  . Hypertension   . Sinus node dysfunction [facility])    [employer] pacemaker 12/05/11    Past Surgical History:  Procedure Laterality Date  . PERMANENT PACEMAKER INSERTION  12/05/2011   [employer]  . PERMANENT PACEMAKER INSERTION N/A 12/05/2011   Procedure: PERMANENT PACEMAKER INSERTION;  Surgeon: Sanda Klein, MD;  Location: [facility];  Service: Cardiovascular;  Laterality: N/A;  . REPLACEMENT TOTAL KNEE  03/23/2009   right  . US ECHOCARDIOGRAPHY  11/01/2011   mildly dilated LA    Current Outpatient Medications  Medication Sig Dispense Refill  . allopurinol (ZYLOPRIM) 100 MG tablet Take 100 mg by mouth daily.  9  . atorvastatin (LIPITOR) 20 MG tablet Take 1 tablet by mouth daily. Take 1 tab daily in the evening  2  . benazepril  (LOTENSIN) 20 MG tablet Take 20 mg by mouth daily.    Marland Kitchen buPROPion (WELLBUTRIN SR) 150 MG 12 hr tablet Take 150 mg by mouth 2 (two) times daily.    . clindamycin (CLEOCIN) 300 MG capsule For dental prodcures prn    . clonazePAM (KLONOPIN) 0.5 MG tablet Take 0.5 mg by mouth daily as needed.    . fexofenadine (ALLEGRA) 180 MG tablet Take 180 mg by mouth daily as needed.     . fish oil-omega-3 fatty acids 1000 MG capsule Take 1 g by mouth daily.    Marland Kitchen GLUCOSAMINE-CHONDROIT-CALCIUM PO Take 1 tablet by mouth 2 (two) times daily.    . hydrochlorothiazide (HYDRODIURIL) 25 MG tablet Take 25 mg by mouth daily.    . magnesium oxide (MAG-OX) 400 MG tablet Take 400 mg by mouth daily.    . Multiple Vitamin (MULTIVITAMIN WITH MINERALS) TABS Take 1 tablet by mouth daily.    . sertraline (ZOLOFT) 100 MG tablet Take 150 mg by mouth daily.     Alveda Reasons 20 MG TABS tablet TAKE 1 TABLET(20 MG) BY MOUTH DAILY WITH DINNER 30 tablet 2   No current facility-administered medications for this visit.     Allergies:   Penicillins    Social History:  The patient  reports that  has never smoked. he has never used smokeless tobacco. He  reports that he drinks alcohol. He reports that he does not use drugs.   Family History:  The patient's family history includes Diabetes in his father.    ROS:  Please see the history of present illness. All other systems are reviewed and negative.    PHYSICAL EXAM: VS:  BP 124/76   Pulse 61   Ht 6' (1.829 m)   Wt 245 lb (111.1 kg)   SpO2 97%   BMI 33.23 kg/m  , BMI Body mass index is 33.23 kg/m. GEN: Well nourished, well developed, male in no acute distress  HEENT: normal for age  Neck: no JVD, no carotid bruit, no masses Cardiac: RRR; soft murmur, no rubs, or gallops Respiratory:  clear to auscultation bilaterally, normal work of breathing GI: soft, nontender, nondistended, + BS MS: no deformity or atrophy; no edema; distal pulses are 2+ in all 4 extremities   Skin: warm  and dry, no rash Neuro:  Strength and sensation are intact Psych: euthymic mood, full affect   EKG:  EKG is ordered today. The ekg ordered today demonstrates A pacing, HR 61, no ischemic changes   Recent Labs: No results found for requested labs within last 8760 hours.    Lipid Panel No results found for: CHOL, TRIG, HDL, CHOLHDL, VLDL, LDLCALC, LDLDIRECT   Wt Readings from Last 3 Encounters:  04/24/17 245 lb (111.1 kg)  12/06/16 235 lb (106.6 kg)  04/19/16 238 lb 9.6 oz (108.2 kg)     Other studies Reviewed: Additional studies/ records that were reviewed today include: Office notes, hospital records and testing.  ASSESSMENT AND PLAN:  1.  Sick sinus syndrome, status post [employer] pacemaker: He is asymptomatic and the pacemaker based on his ECG.  He is to appointments for downloads as scheduled.  2.  PAF: Episodes have been extremely brief.  He is not aware of any of them.  Follow-up on the pacemaker interrogation to make sure the amount of atrial fib has not increased.  3.  VT: Episodes were brief and infrequent asymptomatic.  Follow  4.  Chronic anticoagulation: He requested samples of Xarelto as he is in the donut hole.  We were able to provide him with some.  No bleeding issues.  It is okay to hold the Xarelto for the colonoscopy, 3 days before the procedure.  Restart as soon after the procedure as the GI doctors will allow.  5.  Preoperative evaluation: His activity level is good.  He is having no ischemic symptoms.  No further cardiac evaluation is needed for the colonoscopy.   Current medicines are reviewed at length with the patient today.  The patient has concerns regarding medicines. Concerns were addressed The following changes have been made:  no change  Labs/ tests ordered today include:   Orders Placed This Encounter  Procedures  . EKG 12-Lead     Disposition:   FU with Dr. Sallyanne Kuster  Signed, Rosaria Ferries, PA-C  04/24/2017 10:21 AM      [place] Phone: [phone]; Fax: [phone]  This note was written with the assistance of speech recognition software. Please excuse any transcriptional errors.

## 2017-04-24 NOTE — Progress Notes (Signed)
Thanks MCr 

## 2017-04-24 NOTE — Telephone Encounter (Signed)
   [facility] Pre-operative Risk Assessment    Request for surgical clearance:  1. What type of surgery is being performed? HERNIA REPAIR   2. When is this surgery scheduled? PENDING   3. Are there any medications that need to be held prior to surgery and how long?XARELTO- THEY ARE ASKING FOR DIRECTION  4. Practice name and name of physician performing surgery? Donnie Mesa MD   5. What is your office phone and fax number? [phone]  FAX=[phone]   6. Anesthesia type (None, local, MAC, general) ? GENERAL   George Wolf 04/24/2017, 4:54 PM  _________________________________________________________________   (provider comments below)

## 2017-04-25 ENCOUNTER — Encounter: Payer: Self-pay | Admitting: Cardiology

## 2017-04-25 NOTE — Telephone Encounter (Signed)
Patient with diagnosis of Afib on Xarelto for anticoagulation.    Procedure: hernia repair Date of procedure: pending  CHADS2 score of 1 (CHF, HTN, AGE, DM2, stroke/tia x 2);  CHADS2-VASc score of  2 (CHF, HTN, AGE, DM2, stroke/tia x 2, CAD, AGE, male)  CrCl 145mL/min  Per office protocol, patient can hold Xarelto for 3 days prior to procedure.

## 2017-04-25 NOTE — Telephone Encounter (Signed)
Will route to pharmacy for recommendations on anticoagulation prior to hernia surgery.

## 2017-04-25 NOTE — Telephone Encounter (Signed)
   Chart reviewed as part of pre-operative protocol coverage. Given past medical history and time since last visit, based on ACC/AHA guidelines, George Wolf would be at acceptable risk for the planned procedure without further cardiovascular testing.   According to the Revised Cardiac Risk Index (RCRI), his risk of perioperative major cardiac event is 0.4% which is low.   The patient is on Xarelto for atrial fibrillation stroke risk reduction. Per our pharmacist and office protocol the patient can hold Xarelto for 3 days prior to the procedure and resume after the procedure when OK with the surgeon.   I will route this recommendation to the requesting party via Epic fax function and remove from pre-op pool.  Please call with questions.  Daune Perch, NP 04/25/2017, 4:50 PM

## 2017-04-27 ENCOUNTER — Encounter: Payer: Self-pay | Admitting: Cardiology

## 2017-05-03 DIAGNOSIS — D126 Benign neoplasm of colon, unspecified: Secondary | ICD-10-CM | POA: Diagnosis not present

## 2017-05-03 DIAGNOSIS — K635 Polyp of colon: Secondary | ICD-10-CM | POA: Diagnosis not present

## 2017-05-03 DIAGNOSIS — Z1211 Encounter for screening for malignant neoplasm of colon: Secondary | ICD-10-CM | POA: Diagnosis not present

## 2017-05-08 LAB — CUP PACEART REMOTE DEVICE CHECK
Battery Remaining Percentage: 97 %
Implantable Lead Implant Date: 20130708
Implantable Lead Location: 753859
Implantable Lead Location: 753860
Implantable Lead Serial Number: 29021711
Implantable Pulse Generator Implant Date: 20130708
Lead Channel Impedance Value: 578 Ohm
Lead Channel Pacing Threshold Pulse Width: 0.4 ms
Lead Channel Setting Pacing Amplitude: 2.4 V
MDC IDC LEAD IMPLANT DT: 20130708
MDC IDC LEAD SERIAL: 29201221
MDC IDC MSMT BATTERY REMAINING LONGEVITY: 90 mo
MDC IDC MSMT LEADCHNL RA IMPEDANCE VALUE: 614 Ohm
MDC IDC MSMT LEADCHNL RA PACING THRESHOLD AMPLITUDE: 0.6 V
MDC IDC SESS DTM: 20181120064100
MDC IDC SET LEADCHNL RA PACING AMPLITUDE: 2 V
MDC IDC SET LEADCHNL RV PACING PULSEWIDTH: 0.6 ms
MDC IDC SET LEADCHNL RV SENSING SENSITIVITY: 2.5 mV
MDC IDC STAT BRADY RA PERCENT PACED: 88 %
MDC IDC STAT BRADY RV PERCENT PACED: 3 %
Pulse Gen Serial Number: 112155

## 2017-05-10 DIAGNOSIS — K635 Polyp of colon: Secondary | ICD-10-CM | POA: Diagnosis not present

## 2017-05-10 DIAGNOSIS — D126 Benign neoplasm of colon, unspecified: Secondary | ICD-10-CM | POA: Diagnosis not present

## 2017-05-24 NOTE — Pre-Procedure Instructions (Addendum)
Tobey Schmelzle [facility]  05/24/2017      [facility] - [place], [place] - [address] [place] [address] [place] [address] [place] [address] Phone: [phone] Fax: [phone]    Your procedure is scheduled on  Wednesday 05/31/17  Report to [facility] Admitting at 1230 P.M.  Call this number if you have problems the morning of surgery:  [phone]   Remember:  Do not eat food or drink liquids after midnight.  Take these medicines the morning of surgery with A SIP OF WATER  ALLOPURINOL, BUPROPION (WELLBUTRIN), CLONAZEPAM (KLONOPIN), SERTRALINE (ZOLOFT)  7 days prior to surgery STOP taking any Aspirin(unless otherwise instructed by your surgeon), Aleve, Naproxen, Ibuprofen, Motrin, Advil, Goody's, BC's, all herbal medications, fish oil, and all vitamins.  LAST DOSE OF XARELTO 05/27/17.   Do not wear jewelry, make-up or nail polish.  Do not wear lotions, powders, or perfumes, or deodorant.  Do not shave 48 hours prior to surgery.  Men may shave face and neck.  Do not bring valuables to the hospital.  [facility] is not responsible for any belongings or valuables.  Contacts, dentures or bridgework may not be worn into surgery.  Leave your suitcase in the car.  After surgery it may be brought to your room.  For patients admitted to the hospital, discharge time will be determined by your treatment team.  Patients discharged the day of surgery will not be allowed to drive home.   Name and phone number of your driver:    Special instructions:  [place] - Preparing for Surgery  Before surgery, you can play an important role.  Because skin is not sterile, your skin needs to be as free of germs as possible.  You can reduce the number of germs on you skin by washing with CHG (chlorahexidine gluconate) soap before surgery.  CHG is an antiseptic cleaner which kills germs and bonds with the skin to continue killing germs even after  washing.  Please DO NOT use if you have an allergy to CHG or antibacterial soaps.  If your skin becomes reddened/irritated stop using the CHG and inform your nurse when you arrive at Short Stay.  Do not shave (including legs and underarms) for at least 48 hours prior to the first CHG shower.  You may shave your face.  Please follow these instructions carefully:   1.  Shower with CHG Soap the night before surgery and the                                morning of Surgery.  2.  If you choose to wash your hair, wash your hair first as usual with your       normal shampoo.  3.  After you shampoo, rinse your hair and body thoroughly to remove the                      Shampoo.  4.  Use CHG as you would any other liquid soap.  You can apply chg directly       to the skin and wash gently with scrungie or a clean washcloth.  5.  Apply the CHG Soap to your body ONLY FROM THE NECK DOWN.        Do not use on open wounds or open sores.  Avoid contact  with your eyes,       ears, mouth and genitals (private parts).  Wash genitals (private parts)       with your normal soap.  6.  Wash thoroughly, paying special attention to the area where your surgery        will be performed.  7.  Thoroughly rinse your body with warm water from the neck down.  8.  DO NOT shower/wash with your normal soap after using and rinsing off       the CHG Soap.  9.  Pat yourself dry with a clean towel.            10.  Wear clean pajamas.            11.  Place clean sheets on your bed the night of your first shower and do not        sleep with pets.  Day of Surgery  Do not apply any lotions/deoderants the morning of surgery.  Please wear clean clothes to the hospital/surgery center.    Please read over the following fact sheets that you were given. Pain Booklet

## 2017-05-25 ENCOUNTER — Encounter (HOSPITAL_COMMUNITY): Payer: Self-pay

## 2017-05-25 ENCOUNTER — Encounter (HOSPITAL_COMMUNITY)
Admission: RE | Admit: 2017-05-25 | Discharge: 2017-05-25 | Disposition: A | Discharge status: Home / Self Care | Payer: PPO | Source: Ambulatory Visit | Attending: Surgery | Admitting: Surgery

## 2017-05-25 ENCOUNTER — Other Ambulatory Visit: Payer: Self-pay

## 2017-05-25 DIAGNOSIS — I10 Essential (primary) hypertension: Secondary | ICD-10-CM | POA: Diagnosis not present

## 2017-05-25 DIAGNOSIS — Z01812 Encounter for preprocedural laboratory examination: Secondary | ICD-10-CM | POA: Insufficient documentation

## 2017-05-25 DIAGNOSIS — I4891 Unspecified atrial fibrillation: Secondary | ICD-10-CM | POA: Insufficient documentation

## 2017-05-25 DIAGNOSIS — Z950 Presence of cardiac pacemaker: Secondary | ICD-10-CM | POA: Insufficient documentation

## 2017-05-25 HISTORY — DX: Malignant (primary) neoplasm, unspecified: C80.1

## 2017-05-25 HISTORY — DX: Unspecified osteoarthritis, unspecified site: M19.90

## 2017-05-25 HISTORY — DX: Depression, unspecified: F32.A

## 2017-05-25 HISTORY — DX: Major depressive disorder, single episode, unspecified: F32.9

## 2017-05-25 LAB — BASIC METABOLIC PANEL
ANION GAP: 5 (ref 5–15)
BUN: 17 mg/dL (ref 6–20)
CHLORIDE: 102 mmol/L (ref 101–111)
CO2: 31 mmol/L (ref 22–32)
Calcium: 9 mg/dL (ref 8.9–10.3)
Creatinine, Ser: 0.89 mg/dL (ref 0.61–1.24)
GFR calc non Af Amer: >60 mL/min (ref >60)
Glucose, Bld: 100 mg/dL — ABNORMAL HIGH (ref 65–99)
POTASSIUM: 3.7 mmol/L (ref 3.5–5.1)
Sodium: 138 mmol/L (ref 135–145)

## 2017-05-25 LAB — CBC
HEMATOCRIT: 41.9 % (ref 39.0–52.0)
HEMOGLOBIN: 14 g/dL (ref 13.0–17.0)
MCH: 32 pg (ref 26.0–34.0)
MCHC: 33.4 g/dL (ref 30.0–36.0)
MCV: 95.7 fL (ref 78.0–100.0)
Platelets: 179 10*3/uL (ref 150–400)
RBC: 4.38 MIL/uL (ref 4.22–5.81)
RDW: 13.2 % (ref 11.5–15.5)
WBC: 6 10*3/uL (ref 4.0–10.5)

## 2017-05-25 NOTE — Progress Notes (Signed)
PATIENT STATED HIS LAST DOSE OF XARELTO WILL BE 05/27/17 AS HE WAS INSTRUCTED BY CARDIOLOGIST TO STOP 3 DAYS PRIOR TO SURGERY.  CARDIAC CLEARANCE IS ON CHART FROM HEART CARE.  FAXED [place].

## 2017-05-25 NOTE — Progress Notes (Signed)
   05/25/17 1242  OBSTRUCTIVE SLEEP APNEA  Have you ever been diagnosed with sleep apnea through a sleep study? No  Do you snore loudly (loud enough to be heard through closed doors)?  1  Do you often feel tired, fatigued, or sleepy during the daytime (such as falling asleep during driving or talking to someone)? 0  Has anyone observed you stop breathing during your sleep? 0  Do you have, or are you being treated for high blood pressure? 1  BMI more than 35 kg/m2? 0  Age > 50 (1-yes) 1  Neck circumference greater than:Male 16 inches or larger, Male 17inches or larger? 1  Male Gender (Yes=1) 1  Obstructive Sleep Apnea Score 5  Score 5 or greater  Results sent to PCP

## 2017-05-26 NOTE — Progress Notes (Signed)
Anesthesia chart review:  Patient is a 71 year old male scheduled for umbilical hernia repair, insertion of mesh on 05/31/2017 with Donnie Mesa, M.D.  - PCP is Merrilee Seashore, M.D. - Cardiologist is Sanda Klein, M.D. Last office visit 04/24/17 with Rosaria Ferries, PA. Pt cleared for surgery by Daune Perch, NP on 04/25/17  PMH includes: Atrial fibrillation, sinus node dysfunction, pacemaker [employer]; inserted 12/05/11), HTN. Never smoker. BMI 33.5.  Medications include: Lipitor, benazepril, HCTZ, xarelto. Last dose xarelto will be 05/27/17.  BP (!) 154/96   Pulse 62   Temp 37.1 C (Oral)   Resp 20   Ht 6' (1.829 m)   Wt 247 lb 9.6 oz (112.3 kg)   SpO2 100%   BMI 33.58 kg/m   Preoperative labs reviewed.   - PT will be obtained day of surgery  EKG 04/24/17: Atrial paced rhythm. Inferior infarct, age undetermined. Appear stable compared to prior EKG 04/19/16  Nuclear stress test 01/30/13:  - Normal stress nuclear study. - LV Wall Motion:  Low normal left ventricular systolic function, normal regional wall motion  Echo 11/01/11:  1. Normal LV size, systolic and diastolic function. 2. Mildly dilated LA. 3. All other chambers appear to be normal. 4. No significant valvular abnormality. 5. No pericardial effusion.  Perioperative pacemaker prescription form indicates procedure should not interfere with device function; no device reprogramming magnet placement needed.  If no changes, I anticipate pt can proceed with surgery as scheduled.   Willeen Cass, FNP-BC [facility] Short Stay Surgical Center/Anesthesiology Phone: [phone] 05/26/2017 9:27 AM

## 2017-05-31 ENCOUNTER — Other Ambulatory Visit: Payer: Self-pay

## 2017-05-31 ENCOUNTER — Ambulatory Visit (HOSPITAL_COMMUNITY): Payer: PPO | Admitting: Emergency Medicine

## 2017-05-31 ENCOUNTER — Encounter (HOSPITAL_COMMUNITY): Payer: Self-pay

## 2017-05-31 ENCOUNTER — Ambulatory Visit (HOSPITAL_COMMUNITY): Payer: PPO | Admitting: Certified Registered Nurse Anesthetist

## 2017-05-31 ENCOUNTER — Ambulatory Visit (HOSPITAL_COMMUNITY)
Admission: RE | Admit: 2017-05-31 | Discharge: 2017-05-31 | Disposition: A | Discharge status: Home / Self Care | Payer: PPO | Source: Ambulatory Visit | Attending: Surgery | Admitting: Surgery

## 2017-05-31 ENCOUNTER — Encounter (HOSPITAL_COMMUNITY)
Admission: RE | Disposition: A | Discharge status: Home / Self Care | Payer: Self-pay | Source: Ambulatory Visit | Attending: Surgery

## 2017-05-31 DIAGNOSIS — I10 Essential (primary) hypertension: Secondary | ICD-10-CM | POA: Diagnosis not present

## 2017-05-31 DIAGNOSIS — F329 Major depressive disorder, single episode, unspecified: Secondary | ICD-10-CM | POA: Diagnosis not present

## 2017-05-31 DIAGNOSIS — I480 Paroxysmal atrial fibrillation: Secondary | ICD-10-CM | POA: Diagnosis not present

## 2017-05-31 DIAGNOSIS — I4891 Unspecified atrial fibrillation: Secondary | ICD-10-CM | POA: Diagnosis not present

## 2017-05-31 DIAGNOSIS — Z79899 Other long term (current) drug therapy: Secondary | ICD-10-CM | POA: Diagnosis not present

## 2017-05-31 DIAGNOSIS — Z859 Personal history of malignant neoplasm, unspecified: Secondary | ICD-10-CM | POA: Diagnosis not present

## 2017-05-31 DIAGNOSIS — Z7901 Long term (current) use of anticoagulants: Secondary | ICD-10-CM | POA: Insufficient documentation

## 2017-05-31 DIAGNOSIS — K429 Umbilical hernia without obstruction or gangrene: Secondary | ICD-10-CM | POA: Diagnosis not present

## 2017-05-31 DIAGNOSIS — Z79891 Long term (current) use of opiate analgesic: Secondary | ICD-10-CM | POA: Insufficient documentation

## 2017-05-31 DIAGNOSIS — Z950 Presence of cardiac pacemaker: Secondary | ICD-10-CM | POA: Insufficient documentation

## 2017-05-31 DIAGNOSIS — I495 Sick sinus syndrome: Secondary | ICD-10-CM | POA: Diagnosis not present

## 2017-05-31 HISTORY — PX: INSERTION OF MESH: SHX5868

## 2017-05-31 HISTORY — PX: UMBILICAL HERNIA REPAIR: SHX196

## 2017-05-31 LAB — PROTIME-INR
INR: 0.98
Prothrombin Time: 12.9 seconds (ref 11.4–15.2)

## 2017-05-31 SURGERY — REPAIR, HERNIA, UMBILICAL, ADULT
Anesthesia: General

## 2017-05-31 MED ORDER — HYDROCODONE-ACETAMINOPHEN 5-325 MG PO TABS: 1.0000 | ORAL_TABLET | Freq: Four times a day (QID) | ORAL | 0 refills | Status: DC | PRN

## 2017-05-31 MED ORDER — ONDANSETRON HCL 4 MG/2ML IJ SOLN: 4.0000 mg | Freq: Four times a day (QID) | INTRAMUSCULAR | Status: DC | PRN

## 2017-05-31 MED ORDER — PROPOFOL 10 MG/ML IV BOLUS
INTRAVENOUS | Status: AC
  Filled 2017-05-31: qty 20

## 2017-05-31 MED ORDER — GABAPENTIN 300 MG PO CAPS
300.0000 mg | ORAL_CAPSULE | ORAL | Status: AC
  Administered 2017-05-31: 300 mg via ORAL
  Filled 2017-05-31: qty 1

## 2017-05-31 MED ORDER — BUPIVACAINE-EPINEPHRINE 0.25% -1:200000 IJ SOLN
INTRAMUSCULAR | Status: DC | PRN
  Administered 2017-05-31: 10 mL

## 2017-05-31 MED ORDER — FENTANYL CITRATE (PF) 100 MCG/2ML IJ SOLN
INTRAMUSCULAR | Status: DC | PRN
  Administered 2017-05-31: 75 ug via INTRAVENOUS

## 2017-05-31 MED ORDER — LACTATED RINGERS IV SOLN
INTRAVENOUS | Status: DC | PRN
  Administered 2017-05-31: 14:00:00 via INTRAVENOUS

## 2017-05-31 MED ORDER — DEXAMETHASONE SODIUM PHOSPHATE 10 MG/ML IJ SOLN
INTRAMUSCULAR | Status: DC | PRN
  Administered 2017-05-31: 5 mg via INTRAVENOUS

## 2017-05-31 MED ORDER — BUPIVACAINE-EPINEPHRINE (PF) 0.25% -1:200000 IJ SOLN
INTRAMUSCULAR | Status: AC
  Filled 2017-05-31: qty 30

## 2017-05-31 MED ORDER — ONDANSETRON HCL 4 MG/2ML IJ SOLN
INTRAMUSCULAR | Status: DC | PRN
  Administered 2017-05-31: 4 mg via INTRAVENOUS

## 2017-05-31 MED ORDER — DEXAMETHASONE SODIUM PHOSPHATE 10 MG/ML IJ SOLN
INTRAMUSCULAR | Status: AC
  Filled 2017-05-31: qty 4

## 2017-05-31 MED ORDER — EPHEDRINE 5 MG/ML INJ
INTRAVENOUS | Status: AC
  Filled 2017-05-31: qty 10

## 2017-05-31 MED ORDER — ONDANSETRON HCL 4 MG/2ML IJ SOLN
INTRAMUSCULAR | Status: AC
  Filled 2017-05-31: qty 8

## 2017-05-31 MED ORDER — HYDROMORPHONE HCL 1 MG/ML IJ SOLN: 0.2500 mg | INTRAMUSCULAR | Status: DC | PRN

## 2017-05-31 MED ORDER — FENTANYL CITRATE (PF) 250 MCG/5ML IJ SOLN
INTRAMUSCULAR | Status: AC
  Filled 2017-05-31: qty 5

## 2017-05-31 MED ORDER — ACETAMINOPHEN 500 MG PO TABS
1000.0000 mg | ORAL_TABLET | ORAL | Status: AC
  Administered 2017-05-31: 1000 mg via ORAL
  Filled 2017-05-31: qty 2

## 2017-05-31 MED ORDER — PHENYLEPHRINE 40 MCG/ML (10ML) SYRINGE FOR IV PUSH (FOR BLOOD PRESSURE SUPPORT)
PREFILLED_SYRINGE | INTRAVENOUS | Status: AC
  Filled 2017-05-31: qty 10

## 2017-05-31 MED ORDER — OXYCODONE HCL 5 MG/5ML PO SOLN: 5.0000 mg | Freq: Once | ORAL | Status: DC | PRN

## 2017-05-31 MED ORDER — CHLORHEXIDINE GLUCONATE CLOTH 2 % EX PADS: 6.0000 | MEDICATED_PAD | Freq: Once | CUTANEOUS | Status: DC

## 2017-05-31 MED ORDER — OXYCODONE HCL 5 MG PO TABS: 5.0000 mg | ORAL_TABLET | Freq: Once | ORAL | Status: DC | PRN

## 2017-05-31 MED ORDER — CEFAZOLIN SODIUM-DEXTROSE 2-4 GM/100ML-% IV SOLN
INTRAVENOUS | Status: AC
  Filled 2017-05-31: qty 100

## 2017-05-31 MED ORDER — 0.9 % SODIUM CHLORIDE (POUR BTL) OPTIME
TOPICAL | Status: DC | PRN
  Administered 2017-05-31: 1000 mL

## 2017-05-31 MED ORDER — LIDOCAINE IN D5W 4-5 MG/ML-% IV SOLN
1.0000 mg/min | INTRAVENOUS | Status: DC
  Administered 2017-05-31: 4 mg/min via INTRAVENOUS
  Filled 2017-05-31: qty 500

## 2017-05-31 MED ORDER — PHENYLEPHRINE HCL 10 MG/ML IJ SOLN
INTRAMUSCULAR | Status: DC | PRN
  Administered 2017-05-31 (×2): 80 ug via INTRAVENOUS

## 2017-05-31 MED ORDER — PROPOFOL 10 MG/ML IV BOLUS
INTRAVENOUS | Status: DC | PRN
  Administered 2017-05-31: 200 mg via INTRAVENOUS

## 2017-05-31 MED ORDER — LIDOCAINE 2% (20 MG/ML) 5 ML SYRINGE
INTRAMUSCULAR | Status: DC | PRN
  Administered 2017-05-31: 50 mg via INTRAVENOUS

## 2017-05-31 MED ORDER — LIDOCAINE 2% (20 MG/ML) 5 ML SYRINGE
INTRAMUSCULAR | Status: AC
  Filled 2017-05-31: qty 5

## 2017-05-31 MED ORDER — CEFAZOLIN SODIUM-DEXTROSE 2-3 GM-%(50ML) IV SOLR
INTRAVENOUS | Status: DC | PRN
  Administered 2017-05-31: 2 g via INTRAVENOUS

## 2017-05-31 MED ORDER — MIDAZOLAM HCL 2 MG/2ML IJ SOLN
INTRAMUSCULAR | Status: AC
  Filled 2017-05-31: qty 2

## 2017-05-31 SURGICAL SUPPLY — 51 items
APL SKNCLS STERI-STRIP NONHPOA (GAUZE/BANDAGES/DRESSINGS) ×1
BENZOIN TINCTURE PRP APPL 2/3 (GAUZE/BANDAGES/DRESSINGS) ×3 IMPLANT
BLADE CLIPPER SURG (BLADE) IMPLANT
BLADE SURG 15 STRL LF DISP TIS (BLADE) ×1 IMPLANT
BLADE SURG 15 STRL SS (BLADE) ×3
CANISTER SUCT 3000ML PPV (MISCELLANEOUS) IMPLANT
CHLORAPREP W/TINT 26ML (MISCELLANEOUS) ×3 IMPLANT
CLOSURE WOUND 1/2 X4 (GAUZE/BANDAGES/DRESSINGS) ×1
COVER SURGICAL LIGHT HANDLE (MISCELLANEOUS) ×3 IMPLANT
DRAPE LAPAROTOMY 100X72 PEDS (DRAPES) ×3 IMPLANT
DRAPE UTILITY XL STRL (DRAPES) ×3 IMPLANT
DRSG TEGADERM 4X4.75 (GAUZE/BANDAGES/DRESSINGS) ×3 IMPLANT
ELECT CAUTERY BLADE 6.4 (BLADE) ×3 IMPLANT
ELECT REM PT RETURN 9FT ADLT (ELECTROSURGICAL) ×3
ELECTRODE REM PT RTRN 9FT ADLT (ELECTROSURGICAL) ×1 IMPLANT
GAUZE SPONGE 2X2 8PLY STRL LF (GAUZE/BANDAGES/DRESSINGS) ×1 IMPLANT
GAUZE SPONGE 4X4 16PLY XRAY LF (GAUZE/BANDAGES/DRESSINGS) ×3 IMPLANT
GLOVE BIO SURGEON STRL SZ7 (GLOVE) ×3 IMPLANT
GLOVE BIOGEL PI IND STRL 7.0 (GLOVE) IMPLANT
GLOVE BIOGEL PI IND STRL 7.5 (GLOVE) ×1 IMPLANT
GLOVE BIOGEL PI IND STRL 8 (GLOVE) IMPLANT
GLOVE BIOGEL PI INDICATOR 7.0 (GLOVE) ×2
GLOVE BIOGEL PI INDICATOR 7.5 (GLOVE) ×2
GLOVE BIOGEL PI INDICATOR 8 (GLOVE) ×2
GLOVE ECLIPSE 7.0 STRL STRAW (GLOVE) ×2 IMPLANT
GLOVE ECLIPSE 8.0 STRL XLNG CF (GLOVE) ×2 IMPLANT
GOWN STRL REUS W/ TWL LRG LVL3 (GOWN DISPOSABLE) ×2 IMPLANT
GOWN STRL REUS W/TWL LRG LVL3 (GOWN DISPOSABLE) ×6
KIT BASIN OR (CUSTOM PROCEDURE TRAY) ×3 IMPLANT
KIT ROOM TURNOVER OR (KITS) ×3 IMPLANT
MESH VENTRALEX ST 1-7/10 CRC S (Mesh General) ×2 IMPLANT
NDL HYPO 25GX1X1/2 BEV (NEEDLE) ×1 IMPLANT
NEEDLE HYPO 25GX1X1/2 BEV (NEEDLE) ×3 IMPLANT
NS IRRIG 1000ML POUR BTL (IV SOLUTION) ×3 IMPLANT
PACK SURGICAL SETUP 50X90 (CUSTOM PROCEDURE TRAY) ×3 IMPLANT
PAD ARMBOARD 7.5X6 YLW CONV (MISCELLANEOUS) ×3 IMPLANT
PENCIL BUTTON HOLSTER BLD 10FT (ELECTRODE) ×3 IMPLANT
SPONGE GAUZE 2X2 STER 10/PKG (GAUZE/BANDAGES/DRESSINGS) ×2
STRIP CLOSURE SKIN 1/2X4 (GAUZE/BANDAGES/DRESSINGS) ×2 IMPLANT
SUT MNCRL AB 4-0 PS2 18 (SUTURE) ×3 IMPLANT
SUT NOVA NAB DX-16 0-1 5-0 T12 (SUTURE) ×3 IMPLANT
SUT NOVA NAB GS-21 0 18 T12 DT (SUTURE) ×3 IMPLANT
SUT VIC AB 3-0 SH 27 (SUTURE) ×6
SUT VIC AB 3-0 SH 27X BRD (SUTURE) ×1 IMPLANT
SYR BULB 3OZ (MISCELLANEOUS) ×3 IMPLANT
SYR CONTROL 10ML LL (SYRINGE) ×3 IMPLANT
TOWEL OR 17X24 6PK STRL BLUE (TOWEL DISPOSABLE) ×3 IMPLANT
TOWEL OR 17X26 10 PK STRL BLUE (TOWEL DISPOSABLE) ×3 IMPLANT
TUBE CONNECTING 12'X1/4 (SUCTIONS)
TUBE CONNECTING 12X1/4 (SUCTIONS) IMPLANT
YANKAUER SUCT BULB TIP NO VENT (SUCTIONS) IMPLANT

## 2017-05-31 NOTE — Transfer of Care (Signed)
Immediate Anesthesia Transfer of Care Note  Patient: George Wolf  Procedure(s) Performed: HERNIA REPAIR UMBILICAL ERAS PATHWAY (N/A ) INSERTION OF MESH (N/A )  Patient Location: PACU  Anesthesia Type:General  Level of Consciousness: awake, alert  and patient cooperative  Airway & Oxygen Therapy: Patient Spontanous Breathing and Patient connected to nasal cannula oxygen  Post-op Assessment: Report given to RN and Post -op Vital signs reviewed and stable  Post vital signs: Reviewed and stable  Last Vitals:  Vitals:   05/31/17 1226  BP: (!) 165/88  Pulse: 68  Resp: 18  Temp: (!) 36.3 C  SpO2: 100%    Last Pain:  Vitals:   05/31/17 1226  TempSrc: Oral      Patients Stated Pain Goal: 3 (58/59/29 2446)  Complications: No apparent anesthesia complications

## 2017-05-31 NOTE — Discharge Instructions (Signed)
[facility] ° °UMBILICAL HERNIA REPAIR: POST OP INSTRUCTIONS ° °Always review your discharge instruction sheet given to you by the facility where your surgery was performed. °IF YOU HAVE DISABILITY OR FAMILY LEAVE FORMS, YOU MUST BRING THEM TO THE OFFICE FOR PROCESSING.   °DO NOT GIVE THEM TO YOUR DOCTOR. ° °1. A  prescription for pain medication may be given to you upon discharge.  Take your pain medication as prescribed, if needed.  If narcotic pain medicine is not needed, then you may take acetaminophen (Tylenol) or ibuprofen (Advil) as needed. °2. Take your usually prescribed medications unless otherwise directed. °3. If you need a refill on your pain medication, please contact your pharmacy.  They will contact our office to request authorization. Prescriptions will not be filled after 5 pm or on week-ends. °4. You should follow a light diet the first 24 hours after arrival home, such as soup and crackers, etc.  Be sure to include lots of fluids daily.  Resume your normal diet the day after surgery. °5. Most patients will experience some swelling and bruising around the umbilicus or in the groin and scrotum.  Ice packs and reclining will help.  Swelling and bruising can take several days to resolve.  °6. It is common to experience some constipation if taking pain medication after surgery.  Increasing fluid intake and taking a stool softener (such as Colace) will usually help or prevent this problem from occurring.  A mild laxative (Milk of Magnesia or Miralax) should be taken according to package directions if there are no bowel movements after 48 hours. °7. Unless discharge instructions indicate otherwise, you may remove your bandages 24-48 hours after surgery, and you may shower at that time.  You will have steri-strips (small skin tapes) in place directly over the incision.  These strips should be left on the skin for 7-10 days. °8. ACTIVITIES:  You may resume regular (light) daily activities  beginning the next day--such as daily self-care, walking, climbing stairs--gradually increasing activities as tolerated.  You may have sexual intercourse when it is comfortable.  Refrain from any heavy lifting or straining until approved by your doctor. °a. You may drive when you are no longer taking prescription pain medication, you can comfortably wear a seatbelt, and you can safely maneuver your car and apply brakes. °b. RETURN TO WORK:  2-3 weeks with light duty - no lifting over 15 lbs. °9. You should see your doctor in the office for a follow-up appointment approximately 2-3 weeks after your surgery.  Make sure that you call for this appointment within a day or two after you arrive home to insure a convenient appointment time. °10. OTHER INSTRUCTIONS:  __________________________________________________________________________________________________________________________________________________________________________________________  °WHEN TO CALL YOUR DOCTOR: °1. Fever over 101.0 °2. Inability to urinate °3. Nausea and/or vomiting °4. Extreme swelling or bruising °5. Continued bleeding from incision. °6. Increased pain, redness, or drainage from the incision ° °The clinic staff is available to answer your questions during regular business hours.  Please don’t hesitate to call and ask to speak to one of the nurses for clinical concerns.  If you have a medical emergency, go to the nearest emergency room or call 911.  A surgeon from [facility] is always on call at the hospital ° ° °[address], [address], [place], [place]  [address] ? ° [address] [address], [place], [place]   [address] °[phone]    [phone]    FAX [phone] °Web site: [email or web address] ° ° °

## 2017-05-31 NOTE — Anesthesia Procedure Notes (Signed)
Procedure Name: LMA Insertion Date/Time: 05/31/2017 2:27 PM Performed by: White, Amedeo Plenty, CRNA Pre-anesthesia Checklist: Patient identified, Emergency Drugs available, Suction available and Patient being monitored Patient Re-evaluated:Patient Re-evaluated prior to induction Oxygen Delivery Method: Circle System Utilized Preoxygenation: Pre-oxygenation with 100% oxygen Induction Type: IV induction Ventilation: Mask ventilation without difficulty LMA: LMA inserted LMA Size: 5.0 Number of attempts: 1 Placement Confirmation: positive ETCO2 Tube secured with: Tape Dental Injury: Teeth and Oropharynx as per pre-operative assessment

## 2017-05-31 NOTE — Anesthesia Preprocedure Evaluation (Addendum)
Anesthesia Evaluation  Patient identified by MRN, date of birth, ID band Patient awake    Reviewed: Allergy & Precautions, H&P , NPO status , Patient's Chart, lab work & pertinent test results  Airway Mallampati: II   Neck ROM: full    Dental   Pulmonary neg pulmonary ROS,    breath sounds clear to auscultation       Cardiovascular hypertension, + dysrhythmias Atrial Fibrillation + pacemaker  Rhythm:regular Rate:Normal     Neuro/Psych PSYCHIATRIC DISORDERS Depression    GI/Hepatic   Endo/Other  obese  Renal/GU      Musculoskeletal  (+) Arthritis ,   Abdominal   Peds  Hematology   Anesthesia Other Findings   Reproductive/Obstetrics                             Anesthesia Physical Anesthesia Plan  ASA: III  Anesthesia Plan: General   Post-op Pain Management:    Induction: Intravenous  PONV Risk Score and Plan: 2 and Ondansetron, Dexamethasone, Midazolam and Treatment may vary due to age or medical condition  Airway Management Planned: LMA  Additional Equipment:   Intra-op Plan:   Post-operative Plan:   Informed Consent: I have reviewed the patients History and Physical, chart, labs and discussed the procedure including the risks, benefits and alternatives for the proposed anesthesia with the patient or authorized representative who has indicated his/her understanding and acceptance.     Plan Discussed with: CRNA, Anesthesiologist and Surgeon  Anesthesia Plan Comments:        Anesthesia Quick Evaluation

## 2017-05-31 NOTE — H&P (Signed)
History of Present Illness  The patient is a 72 year old male who presents with an umbilical hernia.   Referred by Dr. Ashby Dawes for umbilical hernia Cardiology - [facility]  This is a 72 year old male with a pacemaker in place anticoagulated on Xarelto who presents with a 10 year history of a slowly enlarging umbilical hernia. This has become larger and causes occasional discomfort. It is partially reducible. He denies any obstructive symptoms. The patient is awaiting scheduling of a colonoscopy by [facility]. He is now referred to [place] for surgical evaluation for repair of this umbilical hernia.   Past Surgical History  Knee Surgery Right.  Diagnostic Studies History  Colonoscopy >10 years ago  Allergies  No Known Drug Allergies  Allergies Reconciled  Medication History  OxyCODONE HCl (5MG  Tablet, Oral) Active. Allopurinol (100MG  Tablet, Oral) Active. Atorvastatin Calcium (20MG  Tablet, Oral) Active. Benazepril HCl (20MG  Tablet, Oral) Active. BuPROPion HCl ER (SR) (150MG  Tablet ER 12HR, Oral) Active. HydroCHLOROthiazide (25MG  Tablet, Oral) Active. Xarelto (20MG  Tablet, Oral) Active. Sertraline HCl (100MG  Tablet, Oral) Active. Glucosamine Chondroitin Complx (Oral) Active. Magnesium (200MG  Tablet, Oral) Active. Fish Oil Omega-3 (1000MG  Capsule, Oral) Active. Allegra (180MG  Tablet, Oral) Active. Medications Reconciled  Social History  Alcohol use Moderate alcohol use. Caffeine use Coffee. No drug use Tobacco use Never smoker.  Family History Depression Father, Mother. Diabetes Mellitus Father. Hypertension Father.  Other Problems  Cancer Depression Gastroesophageal Reflux Disease High blood pressure     Review of Systems General Not Present- Appetite Loss, Chills, Fatigue, Fever, Night Sweats, Weight Gain and Weight Loss. Skin Not Present- Change in Wart/Mole, Dryness, Hives, Jaundice, New Lesions,  Non-Healing Wounds, Rash and Ulcer. HEENT Present- Seasonal Allergies and Wears glasses/contact lenses. Not Present- Earache, Hearing Loss, Hoarseness, Nose Bleed, Oral Ulcers, Ringing in the Ears, Sinus Pain, Sore Throat, Visual Disturbances and Yellow Eyes. Respiratory Present- Snoring. Not Present- Bloody sputum, Chronic Cough, Difficulty Breathing and Wheezing. Breast Not Present- Breast Mass, Breast Pain, Nipple Discharge and Skin Changes. Cardiovascular Not Present- Chest Pain, Difficulty Breathing Lying Down, Leg Cramps, Palpitations, Rapid Heart Rate, Shortness of Breath and Swelling of Extremities. Gastrointestinal Present- Change in Bowel Habits and Constipation. Not Present- Abdominal Pain, Bloating, Bloody Stool, Chronic diarrhea, Difficulty Swallowing, Excessive gas, Gets full quickly at meals, Hemorrhoids, Indigestion, Nausea, Rectal Pain and Vomiting. Male Genitourinary Not Present- Blood in Urine, Change in Urinary Stream, Frequency, Impotence, Nocturia, Painful Urination, Urgency and Urine Leakage.  Vitals  Weight: 247 lb Height: 72in Body Surface Area: 2.33 m Body Mass Index: 33.5 kg/m  Temp.: 98.32F  Pulse: 79 (Regular)  BP: 145/80 (Sitting, Left Arm, Standard)      Physical Exam   The physical exam findings are as follows: Note:WDWN in NAD Eyes: Pupils equal, round; sclera anicteric HENT: Oral mucosa moist; good dentition Neck: No masses palpated, no thyromegaly Lungs: CTA bilaterally; normal respiratory effort CV: Regular rate and rhythm; no murmurs; extremities well-perfused with no edema Abd: +bowel sounds, soft, non-tender, no palpable organomegaly; bulging hernia at upper edge of umbilicus - partially reducible; minimally tender Skin: Warm, dry; no sign of jaundice Psychiatric - alert and oriented x 4; calm mood and affect    Assessment & Plan   UMBILICAL HERNIA WITHOUT OBSTRUCTION OR GANGRENE (K42.9)  Current Plans Schedule  for Surgery - Umbilical hernia repair with mesh. The surgical procedure has been discussed with the patient. Potential risks, benefits, alternative treatments, and expected outcomes have been explained. All of the patient's questions at  this time have been answered. The likelihood of reaching the patient's treatment goal is good. The patient understand the proposed surgical procedure and wishes to proceed.   Imogene Burn. Georgette Dover, MD, [facility]  General/ Trauma Surgery  05/31/2017 2:01 PM

## 2017-05-31 NOTE — Op Note (Signed)
Indications:  The patient presented with a history of an enlarging umbilical hernia.  The patient was examined and we recommended umbilical hernia repair with mesh.  Pre-operative diagnosis:  Umbilical hernia  Post-operative diagnosis:  Same  Procedure:  Umbilical hernia repair with mesh  Procedure Details  The patient was seen again in the Holding Room. The risks, benefits, complications, treatment options, and expected outcomes were discussed with the patient. The possibilities of reaction to medication, pulmonary aspiration, perforation of viscus, bleeding, recurrent infection, the need for additional procedures, and development of a complication requiring transfusion or further operation were discussed with the patient and/or family. There was concurrence with the proposed plan, and informed consent was obtained. The site of surgery was properly noted/marked. The patient was taken to the Operating Room, identified as [name], and the procedure verified as umbilical hernia repair. A Time Out was held and the above information confirmed.  After an adequate level of general anesthesia was obtained, the patient's abdomen was prepped with Chloraprep and draped in sterile fashion.  We made a transverse incision above the umbilicus.  Dissection was carried down to the hernia sac with cautery.  We dissected bluntly around the hernia sac down to the edge of the fascial defect.  We reduced the hernia sac back into the pre-peritoneal space.  The fascial defect measured 1.7 cm across.  We cleared the fascia in all directions.  A small Ventralex mesh was inserted into the pre-peritoneal space and was deployed.  The mesh was secured with four trans-fascial sutures of 0 Novofil.  The fascial defect was closed with multiple interrupted figure-of-eight 1 Novofil sutures.  The base of the umbilicus was tacked down with 3-0 Vicryl.  3-0 Vicryl was used to close the subcutaneous tissues and 4-0 Monocryl was used  to close the skin.  Steri-strips and clean dressing were applied.  The patient was extubated and brought to the recovery room in stable condition.  All sponge, instrument, and needle counts were correct prior to closure and at the conclusion of the case.   Estimated Blood Loss: Minimal          Complications: None; patient tolerated the procedure well.         Disposition: PACU - hemodynamically stable.         Condition: stable  George Wolf. Georgette Dover, MD, [facility]  General/ Trauma Surgery  05/31/2017 3:11 PM

## 2017-06-01 ENCOUNTER — Encounter (HOSPITAL_COMMUNITY): Payer: Self-pay | Admitting: Surgery

## 2017-06-01 NOTE — Anesthesia Postprocedure Evaluation (Signed)
Anesthesia Post Note  Patient: George Wolf  Procedure(s) Performed: HERNIA REPAIR UMBILICAL ERAS PATHWAY (N/A ) INSERTION OF MESH (N/A )     Patient location during evaluation: PACU Anesthesia Type: General Level of consciousness: awake and alert Pain management: pain level controlled Vital Signs Assessment: post-procedure vital signs reviewed and stable Respiratory status: spontaneous breathing, nonlabored ventilation, respiratory function stable and patient connected to nasal cannula oxygen Cardiovascular status: blood pressure returned to baseline and stable Postop Assessment: no apparent nausea or vomiting Anesthetic complications: no    Last Vitals:  Vitals:   05/31/17 1603 05/31/17 1615  BP: (!) 148/92   Pulse: 60   Resp: 16 (!) 25  Temp: (!) 36.2 C   SpO2: 94%     Last Pain:  Vitals:   05/31/17 1226  TempSrc: Oral                 , S

## 2017-07-18 ENCOUNTER — Ambulatory Visit (INDEPENDENT_AMBULATORY_CARE_PROVIDER_SITE_OTHER): Payer: PPO | Admitting: *Deleted

## 2017-07-18 DIAGNOSIS — I495 Sick sinus syndrome: Secondary | ICD-10-CM | POA: Diagnosis not present

## 2017-07-18 NOTE — Progress Notes (Signed)
Remote pacemaker transmission.   

## 2017-07-19 LAB — CUP PACEART REMOTE DEVICE CHECK
Battery Remaining Longevity: 90 mo
Battery Remaining Percentage: 94 %
Implantable Lead Implant Date: 20130708
Implantable Lead Implant Date: 20130708
Implantable Lead Location: 753860
Implantable Lead Model: 4136
Implantable Lead Serial Number: 29021711
Implantable Lead Serial Number: 29201221
Implantable Pulse Generator Implant Date: 20130708
Lead Channel Impedance Value: 581 Ohm
Lead Channel Pacing Threshold Amplitude: 0.6 V
Lead Channel Pacing Threshold Pulse Width: 0.4 ms
Lead Channel Setting Pacing Amplitude: 2 V
Lead Channel Setting Pacing Amplitude: 2.4 V
Lead Channel Setting Sensing Sensitivity: 2.5 mV
MDC IDC LEAD LOCATION: 753859
MDC IDC MSMT LEADCHNL RA IMPEDANCE VALUE: 680 Ohm
MDC IDC PG SERIAL: 112155
MDC IDC SESS DTM: 20190219111500
MDC IDC SET LEADCHNL RV PACING PULSEWIDTH: 0.6 ms
MDC IDC STAT BRADY RA PERCENT PACED: 88 %
MDC IDC STAT BRADY RV PERCENT PACED: 4 %

## 2017-07-20 ENCOUNTER — Encounter: Payer: Self-pay | Admitting: Cardiology

## 2017-09-07 ENCOUNTER — Telehealth: Payer: Self-pay | Admitting: Cardiovascular Disease

## 2017-09-07 NOTE — Telephone Encounter (Signed)
New message     Patient is having frequent bloody noses , what would you recommend for him to do?

## 2017-09-07 NOTE — Telephone Encounter (Signed)
Spoke to patient. He states he has always had issues with nose bleeds - seasonal.  The nose bleeds are not  contnious at present . Patient wanted to know what he should do. Patient also states he has a called into  Primary . RN recommend using saline nasal spray ,or afrin spray occasionally ( keeping  Nasal passage moist) using  Humidifier.  If unable to stop  Contact primary , urgent cart , or ENT.  AS WELL CONTACT  CARDIOLOGY OFFICE.  Will defer to anticoag.to see if any further recommendation for patient

## 2017-09-07 NOTE — Telephone Encounter (Signed)
No additional recommendations for the patient at this time.  Keeping nares moist should decrease frequency of nosebleeds. Afrin should help with active bleeding.

## 2017-10-17 ENCOUNTER — Telehealth: Payer: Self-pay | Admitting: Cardiology

## 2017-10-17 ENCOUNTER — Encounter: Payer: PPO | Admitting: *Deleted

## 2017-10-17 NOTE — Telephone Encounter (Signed)
LMOVM reminding pt to send remote transmission.   

## 2017-10-19 ENCOUNTER — Encounter: Payer: Self-pay | Admitting: Cardiology

## 2017-10-26 ENCOUNTER — Ambulatory Visit (INDEPENDENT_AMBULATORY_CARE_PROVIDER_SITE_OTHER): Payer: PPO | Admitting: *Deleted

## 2017-10-26 DIAGNOSIS — I495 Sick sinus syndrome: Secondary | ICD-10-CM | POA: Diagnosis not present

## 2017-10-27 NOTE — Progress Notes (Signed)
Remote pacemaker transmission.   

## 2017-10-30 ENCOUNTER — Encounter: Payer: Self-pay | Admitting: Cardiology

## 2017-11-07 ENCOUNTER — Telehealth: Payer: Self-pay | Admitting: Cardiovascular Disease

## 2017-11-07 NOTE — Telephone Encounter (Signed)
New Message:       1. What dental office are you calling from? [facility]  2. What is your office phone number? [phone]  3. What is your fax number? [phone]  4. What type of procedure is the patient having performed? Extraction of all remainder teeth  5. What date is procedure scheduled or is the patient there now? 11/27/17  6. What is your question (ex. Antibiotics prior to procedure, holding medication-we need to know how long dentist wants pt to hold med)? Does pt need to stop blood thinners before this procedure

## 2017-11-07 NOTE — Telephone Encounter (Signed)
Patient with diagnosis of atrial fibrillation on Xarelto for anticoagulation.    Procedure: dental extraction, multiple teeth Date of procedure: 11/27/17  CHADS2-VASc score of  2 (, HTN, AGE, )  CrCl 114.4 Platelet count 179  Per office protocol, patient can hold Xarelto for 3 days prior to procedure.    Patient should restart Xarelto on the evening of procedure or day after, at discretion of procedure MD

## 2017-11-07 NOTE — Telephone Encounter (Signed)
New Message:       *STAT* If patient is at the pharmacy, call can be transferred to refill team.   1. Which medications need to be refilled? (please list name of each medication and dose if known) clindamycin (CLEOCIN) 300 MG capsule  2. Which pharmacy/location (including street and city if local pharmacy) is medication to be sent to?[facility] - [place], [place] - [address] [place] [address]  3. Do they need a 30 day or 90 day supply?

## 2017-11-07 NOTE — Telephone Encounter (Signed)
Faxing pharmD recommendations regarding blood thinner prior to dental procedure.

## 2017-11-09 LAB — CUP PACEART REMOTE DEVICE CHECK
Date Time Interrogation Session: 20190613090823
Implantable Lead Implant Date: 20130708
Implantable Lead Model: 4136
Implantable Lead Model: 4137
Implantable Lead Serial Number: 29021711
Implantable Pulse Generator Implant Date: 20130708
Lead Channel Setting Pacing Amplitude: 2.4 V
Lead Channel Setting Pacing Pulse Width: 0.6 ms
Lead Channel Setting Sensing Sensitivity: 2.5 mV
MDC IDC LEAD IMPLANT DT: 20130708
MDC IDC LEAD LOCATION: 753859
MDC IDC LEAD LOCATION: 753860
MDC IDC LEAD SERIAL: 29201221
MDC IDC SET LEADCHNL RA PACING AMPLITUDE: 2 V
Pulse Gen Serial Number: 112155

## 2017-11-14 NOTE — Telephone Encounter (Signed)
I'm not sure that she needs it.  I asked the pharmacist and she said not indicated. Can you ask her primary cardiologist about this? Thanks

## 2017-11-14 NOTE — Telephone Encounter (Signed)
Follow Up:     Returning a call from today.

## 2017-11-15 NOTE — Telephone Encounter (Signed)
If this is for endocarditis prevention, I agree that he does not need it. MCr

## 2017-11-15 NOTE — Telephone Encounter (Signed)
Spoke with pt and he stated that he will contact the office who did his knee replacement about the Clindamycin. He also states that he has major dental work scheduled and needs to know if he needs to hold his Xarelto 3 days prior ?

## 2017-11-15 NOTE — Telephone Encounter (Signed)
Please call pt and find our why she needs the cleocin. She does not need it for any cardiac reason. She may have been ordered it for her knee replacement. IF so she need to contact that MD.

## 2017-11-16 NOTE — Telephone Encounter (Signed)
If his dentist requests that he stop the Xarelto, he can safely do that for 2-3 days, then restart after the procedure MCr

## 2017-12-05 ENCOUNTER — Other Ambulatory Visit: Payer: Self-pay | Admitting: Cardiovascular Disease

## 2018-01-08 DIAGNOSIS — R351 Nocturia: Secondary | ICD-10-CM | POA: Diagnosis not present

## 2018-01-08 DIAGNOSIS — R311 Benign essential microscopic hematuria: Secondary | ICD-10-CM | POA: Diagnosis not present

## 2018-01-08 DIAGNOSIS — N401 Enlarged prostate with lower urinary tract symptoms: Secondary | ICD-10-CM | POA: Diagnosis not present

## 2018-01-08 DIAGNOSIS — N5201 Erectile dysfunction due to arterial insufficiency: Secondary | ICD-10-CM | POA: Diagnosis not present

## 2018-01-25 ENCOUNTER — Ambulatory Visit (INDEPENDENT_AMBULATORY_CARE_PROVIDER_SITE_OTHER): Payer: PPO | Admitting: *Deleted

## 2018-01-25 DIAGNOSIS — I495 Sick sinus syndrome: Secondary | ICD-10-CM | POA: Diagnosis not present

## 2018-01-25 NOTE — Progress Notes (Signed)
Remote pacemaker transmission.   

## 2018-02-05 DIAGNOSIS — R351 Nocturia: Secondary | ICD-10-CM | POA: Diagnosis not present

## 2018-02-05 DIAGNOSIS — N401 Enlarged prostate with lower urinary tract symptoms: Secondary | ICD-10-CM | POA: Diagnosis not present

## 2018-02-06 DIAGNOSIS — N401 Enlarged prostate with lower urinary tract symptoms: Secondary | ICD-10-CM | POA: Diagnosis not present

## 2018-02-06 DIAGNOSIS — R351 Nocturia: Secondary | ICD-10-CM | POA: Diagnosis not present

## 2018-02-09 DIAGNOSIS — Z23 Encounter for immunization: Secondary | ICD-10-CM | POA: Diagnosis not present

## 2018-02-12 DIAGNOSIS — R351 Nocturia: Secondary | ICD-10-CM | POA: Diagnosis not present

## 2018-02-12 DIAGNOSIS — N401 Enlarged prostate with lower urinary tract symptoms: Secondary | ICD-10-CM | POA: Diagnosis not present

## 2018-02-13 ENCOUNTER — Telehealth: Payer: Self-pay | Admitting: Cardiovascular Disease

## 2018-02-13 ENCOUNTER — Telehealth: Payer: Self-pay

## 2018-02-13 NOTE — Telephone Encounter (Signed)
Received records from [facility], [place], Appt 04/23/18 @ 9:20AM. [place]

## 2018-02-13 NOTE — Telephone Encounter (Signed)
   [facility] Pre-operative Risk Assessment    Request for surgical clearance:  1. What type of surgery is being performed? Urolift  2. When is this surgery scheduled? TBD   3. What type of clearance is required (medical clearance vs. Pharmacy clearance to hold med vs. Both)? Both  4. Are there any medications that need to be held prior to surgery and how long? Xarelto 72 hours    5. Practice name and name of physician performing surgery? [facility], Dr Franchot Gallo   6. What is your office phone number [phone]    7.   What is your office fax number [phone]  8.   Anesthesia type (None, local, MAC, general) ? Not specified   George Wolf 02/13/2018, 4:54 PM  _________________________________________________________________   (provider comments below)

## 2018-02-14 ENCOUNTER — Telehealth: Payer: Self-pay

## 2018-02-14 NOTE — Telephone Encounter (Signed)
Patient has an appointment with Rosaria Ferries PA on 03/12/18 8:30 am for surgical clearance. Will fax to [facility], so they are aware.

## 2018-02-14 NOTE — Telephone Encounter (Signed)
Pt aware that samples of Xarelto 20mg  will be left at the front desk for pick up  Pt voiced appreciation and verbalized understanding.

## 2018-02-14 NOTE — Telephone Encounter (Signed)
   Primary Cardiologist:Mihai [facility], MD  Chart reviewed as part of pre-operative protocol coverage. Because of George Wolf's past medical history and time since last visit, he/she will require a follow-up visit in order to better assess preoperative cardiovascular risk.  Pre-op covering staff: - Please schedule appointment and call patient to inform them. - Please contact requesting surgeon's office via preferred method (i.e, phone, fax) to inform them of need for appointment prior to surgery.  Lyda Jester, PA-C  02/14/2018, 2:39 PM

## 2018-02-14 NOTE — Telephone Encounter (Signed)
Patient stated he needs samples of xarelto will route to [facility] office.

## 2018-02-21 LAB — CUP PACEART REMOTE DEVICE CHECK
Date Time Interrogation Session: 20190925155657
Implantable Lead Implant Date: 20130708
Implantable Lead Implant Date: 20130708
Implantable Lead Location: 753860
Implantable Lead Model: 4136
Implantable Lead Model: 4137
Implantable Lead Serial Number: 29021711
MDC IDC LEAD LOCATION: 753859
MDC IDC LEAD SERIAL: 29201221
MDC IDC PG IMPLANT DT: 20130708
MDC IDC PG SERIAL: 112155

## 2018-03-11 ENCOUNTER — Encounter: Payer: Self-pay | Admitting: Physician Assistant

## 2018-03-11 NOTE — Progress Notes (Signed)
Cardiology Office Note   Date:  03/12/2018   ID:  Wolf, George February 18, 1946, MRN [id number]  PCP:  Merrilee Seashore, MD Cardiologist:  Sanda Klein, MD 04/19/2016 Rosaria Ferries, PA-C 04/24/2017   History of Present Illness: George Wolf is a 72 y.o. male with a history of PAF, SSS s/p BSCi PPM w/ 89% A pacing, OA, HTN, depression  Pt needs surgical clearance for Urolift, appt made  Hewitt Shorts presents for cardiology follow up.  He feels well.   The Urolift does not involve general anesthesia.   He does yard work and garden work. 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  He does not feel limited in any way.  He does not get chest pain or SOB w/ exertion.   No LE edema, no orthopnea or PND.   No palpitations, no presyncope or syncope.  Does not think he has had any atrial fibrillation.   Past Medical History:  Diagnosis Date  . AF (paroxysmal atrial fibrillation) ([place])   . Arthritis   . Cancer [facility])    SKIN CANCER  . Depression   . Hypertension   . Pacemaker 11/2011   [employer] for [employer]  . Sinus node dysfunction [facility])    [employer] pacemaker 12/05/11    Past Surgical History:  Procedure Laterality Date  . FINGER SURGERY     LEFT THUMB   11/2016  . INSERTION OF MESH N/A 05/31/2017   Procedure: INSERTION OF MESH;  Surgeon: Donnie Mesa, MD;  Location: [place];  Service: General;  Laterality: N/A;  . PERMANENT PACEMAKER INSERTION  12/05/2011   [employer]  . PERMANENT PACEMAKER INSERTION N/A 12/05/2011   Procedure: PERMANENT PACEMAKER INSERTION;  Surgeon: Sanda Klein, MD;  Location: [place] CATH LAB;  Service: Cardiovascular;  Laterality: N/A;  . REPLACEMENT TOTAL KNEE  03/23/2009   right  . UMBILICAL HERNIA REPAIR N/A 05/31/2017   Procedure: HERNIA REPAIR UMBILICAL ERAS PATHWAY;  Surgeon: Donnie Mesa, MD;  Location: [place];  Service: General;  Laterality: N/A;  LMA  ONLY  . US ECHOCARDIOGRAPHY  11/01/2011   mildly dilated LA    Current Outpatient Medications  Medication Sig Dispense Refill  . allopurinol (ZYLOPRIM) 100 MG tablet Take 100 mg by mouth daily.  9  . atorvastatin (LIPITOR) 20 MG tablet Take 20 mg by mouth daily at 6 PM. Take 1 tab daily in the evening  2  . benazepril (LOTENSIN) 20 MG tablet Take 20 mg by mouth daily.    Marland Kitchen buPROPion (WELLBUTRIN SR) 150 MG 12 hr tablet Take 150 mg by mouth 2 (two) times daily.    . clindamycin (CLEOCIN) 300 MG capsule Take 600 mg by mouth See admin instructions. For dental prodcures prn    . clonazePAM (KLONOPIN) 0.5 MG tablet Take 0.5 mg by mouth daily as needed for anxiety.     . fexofenadine (ALLEGRA) 180 MG tablet Take 180 mg by mouth daily as needed for allergies.     . fluticasone (FLONASE) 50 MCG/ACT nasal spray Place 1 spray into both nostrils daily.    . hydrochlorothiazide (HYDRODIURIL) 25 MG tablet Take 25 mg by mouth daily.    Marland Kitchen HYDROcodone-acetaminophen (NORCO/VICODIN) 5-325 MG tablet Take 1-2 tablets by mouth every 6 (six) hours as needed for moderate pain. 20 tablet 0  . ibuprofen (ADVIL,MOTRIN) 200 MG tablet Take 600-800 mg by mouth every 8 (eight) hours  as needed for moderate pain (knee).    . Magnesium Oxide 500 MG CAPS Take 500 mg by mouth 2 (two) times daily.    . Multiple Vitamin (MULTIVITAMIN WITH MINERALS) TABS Take 1 tablet by mouth daily.    . sertraline (ZOLOFT) 100 MG tablet Take 100 mg by mouth daily.     Alveda Reasons 20 MG TABS tablet TAKE 1 TABLET(20 MG) BY MOUTH DAILY WITH DINNER 90 tablet 1   No current facility-administered medications for this visit.     Allergies:   Penicillins    Social History:  The patient  reports that he has never smoked. He has never used smokeless tobacco. He reports that he drinks alcohol. He reports that he does not use drugs.   Family History:  The patient's family history includes Diabetes in his father.  He indicated that his mother is  deceased. He indicated that his father is deceased. He indicated that both of his sisters are alive. He indicated that his brother is alive. He indicated that his maternal grandmother is deceased. He indicated that his maternal grandfather is deceased. He indicated that his paternal grandmother is deceased. He indicated that his paternal grandfather is deceased.    ROS:  Please see the history of present illness. All other systems are reviewed and negative.    PHYSICAL EXAM: VS:  BP 128/80 (BP Location: Left Arm, Patient Position: Sitting, Cuff Size: Normal)   Pulse 60   Ht 6' (1.829 m)   Wt 241 lb (109.3 kg)   BMI 32.69 kg/m  , BMI Body mass index is 32.69 kg/m. GEN: Well nourished, well developed, male in no acute distress HEENT: normal for age  Neck: no JVD, no carotid bruit, no masses Cardiac: RRR; soft murmur, no rubs, or gallops Respiratory:  clear to auscultation bilaterally, normal work of breathing GI: soft, nontender, nondistended, + BS MS: no deformity or atrophy; no edema; distal pulses are 2+ in all 4 extremities  Skin: warm and dry, no rash Neuro:  Strength and sensation are intact Psych: euthymic mood, full affect   EKG:  EKG is ordered today. The ekg ordered today demonstrates atrial pacing, heart rate 61, no acute ischemic changes  ECHO: 11/01/2011 EF greater than or equal to 28%, RV systolic function normal, mild LA dilatation No significant abnormalities  MYOVIEW: 2014 Impression Exercise Capacity:  Lexiscan with no exercise. BP Response:  Normal blood pressure response. Clinical Symptoms:  No significant symptoms noted. ECG Impression:  No significant ST segment change suggestive of ischemia. Comparison with Prior Nuclear Study: No previous nuclear study performed  Overall Impression:  Normal stress nuclear study.  LV Wall Motion:  Low normal left ventricular systolic function, normal regional wall motion    Recent Labs: 05/25/2017: BUN 17;  Creatinine, Ser 0.89; Hemoglobin 14.0; Platelets 179; Potassium 3.7; Sodium 138  CBC    Component Value Date/Time   WBC 6.0 05/25/2017 1253   RBC 4.38 05/25/2017 1253   HGB 14.0 05/25/2017 1253   HCT 41.9 05/25/2017 1253   PLT 179 05/25/2017 1253   MCV 95.7 05/25/2017 1253   MCH 32.0 05/25/2017 1253   MCHC 33.4 05/25/2017 1253   RDW 13.2 05/25/2017 1253   LYMPHSABS 0.5 (L) 04/05/2009 1915   MONOABS 0.4 04/05/2009 1915   EOSABS 0.1 04/05/2009 1915   BASOSABS 0.2 (H) 04/05/2009 1915   CMP Latest Ref Rng & Units 05/25/2017 04/05/2009 03/26/2009  Glucose 65 - 99 mg/dL 100(H) 120(H) 126(H)  BUN 6 -  20 mg/dL 17 26(H) 11  Creatinine 0.61 - 1.24 mg/dL 0.89 1.1 0.94  Sodium 135 - 145 mmol/L 138 134(L) 135  Potassium 3.5 - 5.1 mmol/L 3.7 3.6 3.3(L)  Chloride 101 - 111 mmol/L 102 97 96  CO2 22 - 32 mmol/L 31 26 34(H)  Calcium 8.9 - 10.3 mg/dL 9.0 8.7 8.2(L)  Total Protein 6.0 - 8.3 g/dL - 7.4 -  Total Bilirubin 0.3 - 1.2 mg/dL - 0.8 -  Alkaline Phos 39 - 117 U/L - 81 -  AST 0 - 37 U/L - 23 -  ALT 0 - 53 U/L - 35 -     Lipid Panel No results found for: CHOL, TRIG, HDL, CHOLHDL, VLDL, LDLCALC, LDLDIRECT   Wt Readings from Last 3 Encounters:  03/12/18 241 lb (109.3 kg)  05/31/17 247 lb 9.6 oz (112.3 kg)  05/25/17 247 lb 9.6 oz (112.3 kg)     Other studies Reviewed: Additional studies/ records that were reviewed today include: Office notes, hospital records and testing.  ASSESSMENT AND PLAN:  1.  Preoperative cardiovascular exam: The procedure is done without general anesthesia.  He is not limited in any way for chest pain or shortness of breath. -No further cardiac testing is indicated -He is at acceptable risk for the planned procedure  2.  PAF: His device was recently interrogated and he has had no episodes.  3.  Sick sinus syndrome, [employer] PPM: He is A pacing 89% of the time and V pacing 4% of the time.  Battery life is 7 years  4.  Chronic anticoagulation: He  is not having any bleeding issues on the Xarelto   Current medicines are reviewed at length with the patient today.  The patient does not have concerns regarding medicines.  The following changes have been made:  no change  Labs/ tests ordered today include:  No orders of the defined types were placed in this encounter.    Disposition:   FU with Sanda Klein, MD  Signed, Rosaria Ferries, PA-C  03/12/2018 8:55 AM    [place] Phone: [phone]; Fax: [phone]  This note was written with the assistance of speech recognition software.  Please excuse any transcriptional errors.

## 2018-03-12 ENCOUNTER — Encounter: Payer: Self-pay | Admitting: Physician Assistant

## 2018-03-12 ENCOUNTER — Ambulatory Visit: Payer: PPO | Admitting: Physician Assistant

## 2018-03-12 VITALS — BP 128/80 | HR 60 | Ht 72.0 in | Wt 241.0 lb

## 2018-03-12 DIAGNOSIS — Z01810 Encounter for preprocedural cardiovascular examination: Secondary | ICD-10-CM | POA: Diagnosis not present

## 2018-03-12 DIAGNOSIS — Z7901 Long term (current) use of anticoagulants: Secondary | ICD-10-CM

## 2018-03-12 DIAGNOSIS — I495 Sick sinus syndrome: Secondary | ICD-10-CM | POA: Diagnosis not present

## 2018-03-12 DIAGNOSIS — I480 Paroxysmal atrial fibrillation: Secondary | ICD-10-CM | POA: Diagnosis not present

## 2018-03-12 NOTE — Telephone Encounter (Addendum)
   Primary Cardiologist: Sanda Klein, MD  Chart reviewed as part of pre-operative protocol coverage.  He was seen in the office today and is doing well from a cardiac standpoint.  Therefore, based on ACC/AHA guidelines, George Wolf would be at acceptable risk for the planned procedure without further cardiovascular testing.   Okay to hold Xarelto 48 hours preprocedure.  Resume after the procedure when okay with Dr. Diona Fanti  I will route this recommendation to the requesting party via [place] fax function and remove from pre-op pool.  Please call with questions.  Rosaria Ferries, PA-C 03/12/2018, 10:27 AM

## 2018-03-12 NOTE — Patient Instructions (Signed)
Medication Instructions:  Your physician recommends that you continue on your current medications as directed. Please refer to the Current Medication list given to you today.  If you need a refill on your cardiac medications before your next appointment, please call your pharmacy.   Lab work: None ordered If you have labs (blood work) drawn today and your tests are completely normal, you will receive your results only by: Marland Kitchen MyChart Message (if you have MyChart) OR . A paper copy in the mail If you have any lab test that is abnormal or we need to change your treatment, we will call you to review the results.  Testing/Procedures: None ordered  Follow-Up: At [facility], you and your health needs are our priority.  As part of our continuing mission to provide you with exceptional heart care, we have created designated Provider Care Teams.  These Care Teams include your primary Cardiologist (physician) and Advanced Practice Providers (APPs -  Physician Assistants and Nurse Practitioners) who all work together to provide you with the care you need, when you need it. You will need a follow up appointment in 1 year.  Please call our office 2 months in advance to schedule this appointment.  You may see Sanda Klein, MD or one of the following Advanced Practice Providers on your designated Care Team: [place], [place] . Fabian Sharp, PA-C  Any Other Special Instructions Will Be Listed Below (If Applicable).

## 2018-03-12 NOTE — Progress Notes (Signed)
Thank you MCr 

## 2018-04-17 DIAGNOSIS — E782 Mixed hyperlipidemia: Secondary | ICD-10-CM | POA: Diagnosis not present

## 2018-04-17 DIAGNOSIS — Z0000 Encounter for general adult medical examination without abnormal findings: Secondary | ICD-10-CM | POA: Diagnosis not present

## 2018-04-17 DIAGNOSIS — I10 Essential (primary) hypertension: Secondary | ICD-10-CM | POA: Diagnosis not present

## 2018-04-17 DIAGNOSIS — R319 Hematuria, unspecified: Secondary | ICD-10-CM | POA: Diagnosis not present

## 2018-04-17 DIAGNOSIS — R7301 Impaired fasting glucose: Secondary | ICD-10-CM | POA: Diagnosis not present

## 2018-04-17 DIAGNOSIS — N390 Urinary tract infection, site not specified: Secondary | ICD-10-CM | POA: Diagnosis not present

## 2018-04-19 DIAGNOSIS — R351 Nocturia: Secondary | ICD-10-CM | POA: Diagnosis not present

## 2018-04-19 DIAGNOSIS — N401 Enlarged prostate with lower urinary tract symptoms: Secondary | ICD-10-CM | POA: Diagnosis not present

## 2018-04-23 ENCOUNTER — Telehealth: Payer: Self-pay | Admitting: Cardiovascular Disease

## 2018-04-23 ENCOUNTER — Ambulatory Visit: Payer: PPO | Admitting: Cardiovascular Disease

## 2018-04-23 DIAGNOSIS — R338 Other retention of urine: Secondary | ICD-10-CM | POA: Diagnosis not present

## 2018-04-23 DIAGNOSIS — R310 Gross hematuria: Secondary | ICD-10-CM | POA: Diagnosis not present

## 2018-04-23 NOTE — Telephone Encounter (Signed)
Spoke with pt. Pt sts that he had a urolift procedure on 11/21. He was instructed to [place] prior and resume when safe.  Pt sts that he had minor complication and had to return to the urologist to be recatheterized today in their office and was sent home with it. Pt sts that he is still having blood in his urine. Pt sts that they had a irrigation and they had to flush  20-30 times before his urine ran clear. Pt sts that he has not resumes Xarelto and was told by the Urologist that he should resume when his urine is clear. Pt would like Dr.Croitoru's recommendation. Adv pt that I will fwd a message to Dr.C and we will call back with his response.  Pt agreeable with plan and verbalized understanding.

## 2018-04-23 NOTE — Telephone Encounter (Signed)
Pt c/o medication issue:  1. Name of Medication: XARELTO 20 MG TABS tablet  2. How are you currently taking this medication (dosage and times per day)?  TAKE 1 TABLET(20 MG) BY MOUTH DAILY WITH DINNER  3. Are you having a reaction (difficulty breathing--STAT)? no  4. What is your medication issue? Patient states he had a procedure done last week. He wants to know when he should go back on it.

## 2018-04-23 NOTE — Telephone Encounter (Signed)
Spoke with Mr. Ingle - OK to hold Xarelto for up to a week if necessary MCr

## 2018-04-24 DIAGNOSIS — R310 Gross hematuria: Secondary | ICD-10-CM | POA: Diagnosis not present

## 2018-04-25 DIAGNOSIS — N401 Enlarged prostate with lower urinary tract symptoms: Secondary | ICD-10-CM | POA: Diagnosis not present

## 2018-04-25 DIAGNOSIS — R310 Gross hematuria: Secondary | ICD-10-CM | POA: Diagnosis not present

## 2018-04-25 DIAGNOSIS — R351 Nocturia: Secondary | ICD-10-CM | POA: Diagnosis not present

## 2018-05-01 DIAGNOSIS — R7303 Prediabetes: Secondary | ICD-10-CM | POA: Diagnosis not present

## 2018-05-01 DIAGNOSIS — I495 Sick sinus syndrome: Secondary | ICD-10-CM | POA: Diagnosis not present

## 2018-05-01 DIAGNOSIS — M150 Primary generalized (osteo)arthritis: Secondary | ICD-10-CM | POA: Diagnosis not present

## 2018-05-01 DIAGNOSIS — Z0000 Encounter for general adult medical examination without abnormal findings: Secondary | ICD-10-CM | POA: Diagnosis not present

## 2018-05-01 DIAGNOSIS — I480 Paroxysmal atrial fibrillation: Secondary | ICD-10-CM | POA: Diagnosis not present

## 2018-05-01 DIAGNOSIS — E782 Mixed hyperlipidemia: Secondary | ICD-10-CM | POA: Diagnosis not present

## 2018-05-01 DIAGNOSIS — I10 Essential (primary) hypertension: Secondary | ICD-10-CM | POA: Diagnosis not present

## 2018-05-01 DIAGNOSIS — M1A0791 Idiopathic chronic gout, unspecified ankle and foot, with tophus (tophi): Secondary | ICD-10-CM | POA: Diagnosis not present

## 2018-05-01 DIAGNOSIS — R319 Hematuria, unspecified: Secondary | ICD-10-CM | POA: Diagnosis not present

## 2018-05-02 ENCOUNTER — Ambulatory Visit (INDEPENDENT_AMBULATORY_CARE_PROVIDER_SITE_OTHER): Payer: PPO

## 2018-05-02 DIAGNOSIS — R311 Benign essential microscopic hematuria: Secondary | ICD-10-CM | POA: Diagnosis not present

## 2018-05-02 DIAGNOSIS — N401 Enlarged prostate with lower urinary tract symptoms: Secondary | ICD-10-CM | POA: Diagnosis not present

## 2018-05-02 DIAGNOSIS — I495 Sick sinus syndrome: Secondary | ICD-10-CM

## 2018-05-02 DIAGNOSIS — R351 Nocturia: Secondary | ICD-10-CM | POA: Diagnosis not present

## 2018-05-02 NOTE — Progress Notes (Signed)
Remote pacemaker transmission.   

## 2018-05-02 NOTE — Telephone Encounter (Signed)
Spoke with pt and reminded pt of remote transmission that is due today. Pt verbalized understanding.   

## 2018-05-08 ENCOUNTER — Encounter: Payer: Self-pay | Admitting: Cardiology

## 2018-06-06 DIAGNOSIS — N401 Enlarged prostate with lower urinary tract symptoms: Secondary | ICD-10-CM | POA: Diagnosis not present

## 2018-06-06 DIAGNOSIS — R351 Nocturia: Secondary | ICD-10-CM | POA: Diagnosis not present

## 2018-06-18 LAB — CUP PACEART REMOTE DEVICE CHECK
Battery Remaining Longevity: 84 mo
Battery Remaining Percentage: 87 %
Brady Statistic RV Percent Paced: 4 %
Implantable Lead Implant Date: 20130708
Implantable Lead Location: 753860
Implantable Lead Model: 4136
Implantable Lead Model: 4137
Implantable Lead Serial Number: 29021711
Implantable Pulse Generator Implant Date: 20130708
Lead Channel Impedance Value: 565 Ohm
Lead Channel Pacing Threshold Amplitude: 0.6 V
Lead Channel Pacing Threshold Pulse Width: 0.4 ms
Lead Channel Setting Pacing Pulse Width: 0.6 ms
Lead Channel Setting Sensing Sensitivity: 2.5 mV
MDC IDC LEAD IMPLANT DT: 20130708
MDC IDC LEAD LOCATION: 753859
MDC IDC LEAD SERIAL: 29201221
MDC IDC MSMT LEADCHNL RA IMPEDANCE VALUE: 641 Ohm
MDC IDC PG SERIAL: 112155
MDC IDC SESS DTM: 20191204171000
MDC IDC SET LEADCHNL RA PACING AMPLITUDE: 2 V
MDC IDC SET LEADCHNL RV PACING AMPLITUDE: 2.4 V
MDC IDC STAT BRADY RA PERCENT PACED: 89 %

## 2018-08-01 ENCOUNTER — Ambulatory Visit (INDEPENDENT_AMBULATORY_CARE_PROVIDER_SITE_OTHER): Payer: PPO | Admitting: *Deleted

## 2018-08-01 DIAGNOSIS — I480 Paroxysmal atrial fibrillation: Secondary | ICD-10-CM

## 2018-08-01 DIAGNOSIS — I495 Sick sinus syndrome: Secondary | ICD-10-CM

## 2018-08-04 LAB — CUP PACEART REMOTE DEVICE CHECK
Battery Remaining Longevity: 78 mo
Battery Remaining Percentage: 84 %
Brady Statistic RV Percent Paced: 4 %
Implantable Lead Implant Date: 20130708
Implantable Lead Location: 753860
Implantable Lead Model: 4136
Implantable Lead Model: 4137
Implantable Lead Serial Number: 29021711
Implantable Pulse Generator Implant Date: 20130708
Lead Channel Impedance Value: 605 Ohm
Lead Channel Pacing Threshold Amplitude: 0.6 V
Lead Channel Pacing Threshold Pulse Width: 0.4 ms
Lead Channel Setting Pacing Amplitude: 2 V
Lead Channel Setting Pacing Pulse Width: 0.6 ms
Lead Channel Setting Sensing Sensitivity: 2.5 mV
MDC IDC LEAD IMPLANT DT: 20130708
MDC IDC LEAD LOCATION: 753859
MDC IDC LEAD SERIAL: 29201221
MDC IDC MSMT LEADCHNL RV IMPEDANCE VALUE: 581 Ohm
MDC IDC SESS DTM: 20200304064100
MDC IDC SET LEADCHNL RV PACING AMPLITUDE: 2.4 V
MDC IDC STAT BRADY RA PERCENT PACED: 89 %
Pulse Gen Serial Number: 112155

## 2018-08-08 NOTE — Progress Notes (Signed)
Remote pacemaker transmission.   

## 2018-08-09 DIAGNOSIS — D2272 Melanocytic nevi of left lower limb, including hip: Secondary | ICD-10-CM | POA: Diagnosis not present

## 2018-08-09 DIAGNOSIS — L918 Other hypertrophic disorders of the skin: Secondary | ICD-10-CM | POA: Diagnosis not present

## 2018-08-09 DIAGNOSIS — D225 Melanocytic nevi of trunk: Secondary | ICD-10-CM | POA: Diagnosis not present

## 2018-08-09 DIAGNOSIS — L821 Other seborrheic keratosis: Secondary | ICD-10-CM | POA: Diagnosis not present

## 2018-08-09 DIAGNOSIS — D2271 Melanocytic nevi of right lower limb, including hip: Secondary | ICD-10-CM | POA: Diagnosis not present

## 2018-08-09 DIAGNOSIS — L812 Freckles: Secondary | ICD-10-CM | POA: Diagnosis not present

## 2018-10-29 ENCOUNTER — Other Ambulatory Visit: Payer: Self-pay | Admitting: Cardiovascular Disease

## 2018-10-31 ENCOUNTER — Ambulatory Visit (INDEPENDENT_AMBULATORY_CARE_PROVIDER_SITE_OTHER): Payer: PPO | Admitting: *Deleted

## 2018-10-31 DIAGNOSIS — I495 Sick sinus syndrome: Secondary | ICD-10-CM

## 2018-10-31 LAB — CUP PACEART REMOTE DEVICE CHECK
Battery Remaining Longevity: 72 mo
Battery Remaining Percentage: 78 %
Brady Statistic RA Percent Paced: 89 %
Brady Statistic RV Percent Paced: 4 %
Date Time Interrogation Session: 20200603054200
Implantable Lead Implant Date: 20130708
Implantable Lead Implant Date: 20130708
Implantable Lead Location: 753859
Implantable Lead Location: 753860
Implantable Lead Model: 4136
Implantable Lead Model: 4137
Implantable Lead Serial Number: 29021711
Implantable Lead Serial Number: 29201221
Implantable Pulse Generator Implant Date: 20130708
Lead Channel Impedance Value: 572 Ohm
Lead Channel Impedance Value: 633 Ohm
Lead Channel Pacing Threshold Amplitude: 0.6 V
Lead Channel Pacing Threshold Pulse Width: 0.4 ms
Lead Channel Setting Pacing Amplitude: 2 V
Lead Channel Setting Pacing Amplitude: 2.4 V
Lead Channel Setting Pacing Pulse Width: 0.6 ms
Lead Channel Setting Sensing Sensitivity: 2.5 mV
Pulse Gen Serial Number: 112155

## 2018-11-08 ENCOUNTER — Encounter: Payer: Self-pay | Admitting: Cardiology

## 2018-11-08 NOTE — Progress Notes (Signed)
Remote pacemaker transmission.   

## 2018-11-09 DIAGNOSIS — M1712 Unilateral primary osteoarthritis, left knee: Secondary | ICD-10-CM | POA: Diagnosis not present

## 2018-11-09 DIAGNOSIS — Z96651 Presence of right artificial knee joint: Secondary | ICD-10-CM | POA: Diagnosis not present

## 2018-11-09 DIAGNOSIS — Z471 Aftercare following joint replacement surgery: Secondary | ICD-10-CM | POA: Diagnosis not present

## 2019-01-30 ENCOUNTER — Ambulatory Visit (INDEPENDENT_AMBULATORY_CARE_PROVIDER_SITE_OTHER): Payer: PPO | Admitting: *Deleted

## 2019-01-30 DIAGNOSIS — I495 Sick sinus syndrome: Secondary | ICD-10-CM | POA: Diagnosis not present

## 2019-01-30 DIAGNOSIS — I480 Paroxysmal atrial fibrillation: Secondary | ICD-10-CM

## 2019-01-31 LAB — CUP PACEART REMOTE DEVICE CHECK
Battery Remaining Longevity: 72 mo
Battery Remaining Percentage: 77 %
Brady Statistic RA Percent Paced: 89 %
Brady Statistic RV Percent Paced: 4 %
Date Time Interrogation Session: 20200902125300
Implantable Lead Implant Date: 20130708
Implantable Lead Implant Date: 20130708
Implantable Lead Location: 753859
Implantable Lead Location: 753860
Implantable Lead Model: 4136
Implantable Lead Model: 4137
Implantable Lead Serial Number: 29021711
Implantable Lead Serial Number: 29201221
Implantable Pulse Generator Implant Date: 20130708
Lead Channel Impedance Value: 552 Ohm
Lead Channel Impedance Value: 657 Ohm
Lead Channel Pacing Threshold Amplitude: 0.6 V
Lead Channel Pacing Threshold Pulse Width: 0.4 ms
Lead Channel Setting Pacing Amplitude: 2 V
Lead Channel Setting Pacing Amplitude: 2.4 V
Lead Channel Setting Pacing Pulse Width: 0.6 ms
Lead Channel Setting Sensing Sensitivity: 2.5 mV
Pulse Gen Serial Number: 112155

## 2019-02-01 ENCOUNTER — Telehealth: Payer: Self-pay | Admitting: Cardiovascular Disease

## 2019-02-01 NOTE — Telephone Encounter (Signed)
New message   Patient calling the office for samples of medication:   1.  What medication and dosage are you requesting samples for?XARELTO 20 MG TABS tablet  2.  Are you currently out of this medication? No

## 2019-02-01 NOTE — Telephone Encounter (Signed)
No samples Pt notified he will CB next week and see if we have gotten any

## 2019-02-15 NOTE — Progress Notes (Signed)
Remote pacemaker transmission.   

## 2019-02-19 ENCOUNTER — Other Ambulatory Visit: Payer: Self-pay | Admitting: Cardiovascular Disease

## 2019-02-19 NOTE — Telephone Encounter (Signed)
New Message  Patient calling the office for samples of medication:   1.  What medication and dosage are you requesting samples for? XARELTO 20 MG TABS tablet  2.  Are you currently out of this medication? Yes

## 2019-02-20 DIAGNOSIS — Z23 Encounter for immunization: Secondary | ICD-10-CM | POA: Diagnosis not present

## 2019-02-21 MED ORDER — RIVAROXABAN 20 MG PO TABS: ORAL_TABLET | ORAL | 1 refills | Status: DC

## 2019-02-21 NOTE — Telephone Encounter (Signed)
Samples are availabe for pick up  x3 bottles Patient aware

## 2019-03-12 DIAGNOSIS — I10 Essential (primary) hypertension: Secondary | ICD-10-CM | POA: Diagnosis not present

## 2019-03-12 DIAGNOSIS — E782 Mixed hyperlipidemia: Secondary | ICD-10-CM | POA: Diagnosis not present

## 2019-03-12 DIAGNOSIS — R319 Hematuria, unspecified: Secondary | ICD-10-CM | POA: Diagnosis not present

## 2019-03-12 DIAGNOSIS — G459 Transient cerebral ischemic attack, unspecified: Secondary | ICD-10-CM | POA: Diagnosis not present

## 2019-03-12 DIAGNOSIS — Z7189 Other specified counseling: Secondary | ICD-10-CM | POA: Diagnosis not present

## 2019-03-12 DIAGNOSIS — N390 Urinary tract infection, site not specified: Secondary | ICD-10-CM | POA: Diagnosis not present

## 2019-03-12 DIAGNOSIS — H539 Unspecified visual disturbance: Secondary | ICD-10-CM | POA: Diagnosis not present

## 2019-03-19 DIAGNOSIS — H43812 Vitreous degeneration, left eye: Secondary | ICD-10-CM | POA: Diagnosis not present

## 2019-03-19 DIAGNOSIS — H5703 Miosis: Secondary | ICD-10-CM | POA: Diagnosis not present

## 2019-03-19 DIAGNOSIS — H25813 Combined forms of age-related cataract, bilateral: Secondary | ICD-10-CM | POA: Diagnosis not present

## 2019-03-21 DIAGNOSIS — G459 Transient cerebral ischemic attack, unspecified: Secondary | ICD-10-CM | POA: Diagnosis not present

## 2019-04-05 ENCOUNTER — Other Ambulatory Visit: Payer: Self-pay | Admitting: Internal Medicine

## 2019-04-05 DIAGNOSIS — H539 Unspecified visual disturbance: Secondary | ICD-10-CM

## 2019-04-05 DIAGNOSIS — G459 Transient cerebral ischemic attack, unspecified: Secondary | ICD-10-CM

## 2019-04-12 ENCOUNTER — Other Ambulatory Visit: Payer: Self-pay

## 2019-04-12 ENCOUNTER — Ambulatory Visit
Admission: RE | Admit: 2019-04-12 | Discharge: 2019-04-12 | Disposition: A | Discharge status: Home / Self Care | Payer: PPO | Source: Ambulatory Visit | Attending: Internal Medicine | Admitting: Internal Medicine

## 2019-04-12 DIAGNOSIS — G459 Transient cerebral ischemic attack, unspecified: Secondary | ICD-10-CM

## 2019-04-12 DIAGNOSIS — R4789 Other speech disturbances: Secondary | ICD-10-CM | POA: Diagnosis not present

## 2019-04-12 DIAGNOSIS — H539 Unspecified visual disturbance: Secondary | ICD-10-CM

## 2019-04-17 ENCOUNTER — Telehealth: Payer: Self-pay | Admitting: *Deleted

## 2019-04-17 NOTE — Telephone Encounter (Signed)
Virtual Visit Pre-Appointment Phone Call  "(Name), I am calling you today to discuss your upcoming appointment. We are currently trying to limit exposure to the virus that causes COVID-19 by seeing patients at home rather than in the office."  1. "What is the BEST phone number to call the day of the visit?" - include this in appointment notes  2. "Do you have or have access to (through a family member/friend) a smartphone with video capability that we can use for your visit?" a. If yes - list this number in appt notes as "cell" (if different from BEST phone #) and list the appointment type as a VIDEO visit in appointment notes b. If no - list the appointment type as a PHONE visit in appointment notes  3. Confirm consent - "In the setting of the current Covid19 crisis, you are scheduled for a (phone or video) visit with your provider on (date) at (time).  Just as we do with many in-office visits, in order for you to participate in this visit, we must obtain consent.  If you'd like, I can send this to your mychart (if signed up) or email for you to review.  Otherwise, I can obtain your verbal consent now.  All virtual visits are billed to your insurance company just like a normal visit would be.  By agreeing to a virtual visit, we'd like you to understand that the technology does not allow for your provider to perform an examination, and thus may limit your provider's ability to fully assess your condition. If your provider identifies any concerns that need to be evaluated in person, we will make arrangements to do so.  Finally, though the technology is pretty good, we cannot assure that it will always work on either your or our end, and in the setting of a video visit, we may have to convert it to a phone-only visit.  In either situation, we cannot ensure that we have a secure connection.  Are you willing to proceed?" YES  4. Advise patient to be prepared - "Two hours prior to your appointment, go  ahead and check your blood pressure, pulse, oxygen saturation, and your weight (if you have the equipment to check those) and write them all down. When your visit starts, your provider will ask you for this information. If you have an Apple Watch or Kardia device, please plan to have heart rate information ready on the day of your appointment. Please have a pen and paper handy nearby the day of the visit as well."  5. Give patient instructions for MyChart download to smartphone OR [email or web address] as below if video visit (depending on what platform provider is using)  6. Inform patient they will receive a phone call 15 minutes prior to their appointment time (may be from unknown caller ID) so they should be prepared to answer    TELEPHONE CALL NOTE  ZYSHAWN EICHINGER has been deemed a candidate for a follow-up tele-health visit to limit community exposure during the Covid-19 pandemic. I spoke with the patient via phone to ensure availability of phone/video source, confirm preferred email & phone number, and discuss instructions and expectations.  I reminded ASHLIN PREVOST to be prepared with any vital sign and/or heart rhythm information that could potentially be obtained via home monitoring, at the time of his visit. I reminded AN REEDS to expect a phone call prior to his visit.  Ricci Barker, RN 04/17/2019 4:10 PM   INSTRUCTIONS  FOR DOWNLOADING THE MYCHART APP TO SMARTPHONE  - The patient must first make sure to have activated MyChart and know their login information - If Apple, go to [employer] and type in MyChart in the search bar and download the app. If Android, ask patient to go to [employer] and type in [place] in the search bar and download the app. The app is free but as with any other app downloads, their phone may require them to verify saved payment information or Apple/Android password.  - The patient will need to then log into the app with their MyChart username  and password, and select [facility] as their healthcare provider to link the account. When it is time for your visit, go to the MyChart app, find appointments, and click Begin Video Visit. Be sure to Select Allow for your device to access the Microphone and Camera for your visit. You will then be connected, and your provider will be with you shortly.  **If they have any issues connecting, or need assistance please contact MyChart service desk [phone])**  **If using a computer, in order to ensure the best quality for their visit they will need to use either of the following Internet Browsers: [employer], or Google Chrome**  IF USING DOXIMITY or DOXY.ME - The patient will receive a link just prior to their visit by text.     FULL LENGTH CONSENT FOR TELE-HEALTH VISIT   I hereby voluntarily request, consent and authorize [facility] and its employed or contracted physicians, physician assistants, nurse practitioners or other licensed health care professionals (the Practitioner), to provide me with telemedicine health care services (the "Services") as deemed necessary by the treating Practitioner. I acknowledge and consent to receive the Services by the Practitioner via telemedicine. I understand that the telemedicine visit will involve communicating with the Practitioner through live audiovisual communication technology and the disclosure of certain medical information by electronic transmission. I acknowledge that I have been given the opportunity to request an in-person assessment or other available alternative prior to the telemedicine visit and am voluntarily participating in the telemedicine visit.  I understand that I have the right to withhold or withdraw my consent to the use of telemedicine in the course of my care at any time, without affecting my right to future care or treatment, and that the Practitioner or I may terminate the telemedicine visit at any time. I  understand that I have the right to inspect all information obtained and/or recorded in the course of the telemedicine visit and may receive copies of available information for a reasonable fee.  I understand that some of the potential risks of receiving the Services via telemedicine include:  Marland Kitchen Delay or interruption in medical evaluation due to technological equipment failure or disruption; . Information transmitted may not be sufficient (e.g. poor resolution of images) to allow for appropriate medical decision making by the Practitioner; and/or  . In rare instances, security protocols could fail, causing a breach of personal health information.  Furthermore, I acknowledge that it is my responsibility to provide information about my medical history, conditions and care that is complete and accurate to the best of my ability. I acknowledge that Practitioner's advice, recommendations, and/or decision may be based on factors not within their control, such as incomplete or inaccurate data provided by me or distortions of diagnostic images or specimens that may result from electronic transmissions. I understand that the practice of medicine is not an exact science and  that Practitioner makes no warranties or guarantees regarding treatment outcomes. I acknowledge that I will receive a copy of this consent concurrently upon execution via email to the email address I last provided but may also request a printed copy by calling the office of [place].    I understand that my insurance will be billed for this visit.   I have read or had this consent read to me. . I understand the contents of this consent, which adequately explains the benefits and risks of the Services being provided via telemedicine.  . I have been provided ample opportunity to ask questions regarding this consent and the Services and have had my questions answered to my satisfaction. . I give my informed consent for the services to be  provided through the use of telemedicine in my medical care  By participating in this telemedicine visit I agree to the above.

## 2019-04-18 ENCOUNTER — Encounter: Payer: Self-pay | Admitting: Cardiovascular Disease

## 2019-04-18 ENCOUNTER — Telehealth (INDEPENDENT_AMBULATORY_CARE_PROVIDER_SITE_OTHER): Payer: PPO | Admitting: Cardiovascular Disease

## 2019-04-18 DIAGNOSIS — I495 Sick sinus syndrome: Secondary | ICD-10-CM

## 2019-04-18 DIAGNOSIS — I10 Essential (primary) hypertension: Secondary | ICD-10-CM

## 2019-04-18 DIAGNOSIS — E669 Obesity, unspecified: Secondary | ICD-10-CM

## 2019-04-18 DIAGNOSIS — I472 Ventricular tachycardia: Secondary | ICD-10-CM | POA: Diagnosis not present

## 2019-04-18 DIAGNOSIS — Z950 Presence of cardiac pacemaker: Secondary | ICD-10-CM | POA: Diagnosis not present

## 2019-04-18 DIAGNOSIS — I4729 Other ventricular tachycardia: Secondary | ICD-10-CM

## 2019-04-18 DIAGNOSIS — I480 Paroxysmal atrial fibrillation: Secondary | ICD-10-CM | POA: Diagnosis not present

## 2019-04-18 NOTE — Progress Notes (Signed)
Virtual Visit via Video Note   This visit type was conducted due to national recommendations for restrictions regarding the COVID-19 Pandemic (e.g. social distancing) in an effort to limit this patient's exposure and mitigate transmission in our community.  Due to his co-morbid illnesses, this patient is at least at moderate risk for complications without adequate follow up.  This format is felt to be most appropriate for this patient at this time.  All issues noted in this document were discussed and addressed.  A limited physical exam was performed with this format.  Please refer to the patient's chart for his consent to telehealth for [facility].   Date:  04/18/2019   ID:  George Wolf, DOB 07-05-1945, MRN [id number]  Patient Location: Home Provider Location: Other:  [facility]  PCP:  Merrilee Seashore, MD  Cardiologist:  Sanda Klein, MD  Electrophysiologist:  None   Evaluation Performed:  Follow-Up Visit  Chief Complaint: Atrial fibrillation, pacemaker  History of Present Illness:    George Wolf is a 73 y.o. male with sinus node dysfunction and chronotropic incompetence, infrequent paroxysmal atrial fibrillation, essential hypertension, dual-chamber permanent pacemaker.  The patient specifically denies any chest pain at rest exertion, dyspnea at rest or with exertion, orthopnea, paroxysmal nocturnal dyspnea, syncope, palpitations, focal neurological deficits, intermittent claudication, lower extremity edema, unexplained weight gain, cough, hemoptysis or wheezing.  He has not had any falls, serious injuries or bleeding problems  He has generally had a good year.  He underwent a UroLift surgical procedure and no longer has issues with frequency/urgency.  He did have some bladder bleeding for a couple of days but this has completely resolved.  He is compliant with his Xarelto anticoagulation and has not had subsequent bleeding issues.  Over the summer, while at a  family wedding, his family suggested that he may be having some difficulty with his speech and he underwent a work-up for TIA.  George Wolf believes that it was just an exacerbation of his lifelong stutter when he was emotional.  He reports that Dr. Ashby Dawes ordered carotid Doppler ultrasonography and echocardiography studies which were both normal.  He had a CT of the head without contrast which was also normal.  Labs were okay.  He is going back to Dr. Ashby Dawes for follow-up labs next month.  Pacemaker interrogation shows normal device function, but his counters have not been cleared since November 2016.  Estimated generator longevity is 6 years.  Lead parameters are excellent.  He has not had any recent episodes of atrial fibrillation.  He has almost 90% atrial paced rhythm and almost never paces the ventricle (4%).  Since device implantation, he has had a handful of episodes of paroxysmal atrial tachycardia and nonsustained ventricular tachycardia that have been asymptomatic.  In the 3 months leading up to his last pacemaker download he had 1 episode of very short NSVT and a 69-second episode of sustained atrial tachycardia.  Atrial fibrillation has not been detected in a long time.  Heart rate histogram distribution appears appropriate (but represents a cumulative statistic over 4 years).  He is having some knee pain and is to see Dr. Wynelle Link on December 18 at 8:30 AM, in the same building as our office.  When he comes in we will do an in person pacemaker interrogation in our office to clear the counters.  The patient does not have symptoms concerning for COVID-19 infection (fever, chills, cough, or new shortness of breath).    Past Medical  History:  Diagnosis Date   AF (paroxysmal atrial fibrillation) ([place])    Arthritis    Cancer ([place])    SKIN CANCER   Depression    Hypertension    Pacemaker 11/2011   [employer] for SSS   Sinus node dysfunction [facility])    [employer]  pacemaker 12/05/11   Past Surgical History:  Procedure Laterality Date   FINGER SURGERY     LEFT THUMB   11/2016   INSERTION OF MESH N/A 05/31/2017   Procedure: INSERTION OF MESH;  Surgeon: Donnie Mesa, MD;  Location: [place];  Service: General;  Laterality: N/A;   PERMANENT PACEMAKER INSERTION  12/05/2011   [employer]   PERMANENT PACEMAKER INSERTION N/A 12/05/2011   Procedure: PERMANENT PACEMAKER INSERTION;  Surgeon: Sanda Klein, MD;  Location: [place] CATH LAB;  Service: Cardiovascular;  Laterality: N/A;   REPLACEMENT TOTAL KNEE  [id number]   right   UMBILICAL HERNIA REPAIR N/A 05/31/2017   Procedure: HERNIA REPAIR UMBILICAL ERAS PATHWAY;  Surgeon: Donnie Mesa, MD;  Location: [place];  Service: General;  Laterality: N/A;  LMA ONLY   US ECHOCARDIOGRAPHY  11/01/2011   mildly dilated LA     Current Meds  Medication Sig   allopurinol (ZYLOPRIM) 100 MG tablet Take 100 mg by mouth daily.   atorvastatin (LIPITOR) 20 MG tablet Take 20 mg by mouth daily at 6 PM. Take 1 tab daily in the evening   benazepril (LOTENSIN) 20 MG tablet Take 20 mg by mouth daily.   buPROPion (WELLBUTRIN SR) 150 MG 12 hr tablet Take 150 mg by mouth 2 (two) times daily.   clindamycin (CLEOCIN) 300 MG capsule Take 600 mg by mouth See admin instructions. For dental prodcures prn   clonazePAM (KLONOPIN) 0.5 MG tablet Take 0.5 mg by mouth daily as needed for anxiety.    fexofenadine (ALLEGRA) 180 MG tablet Take 180 mg by mouth daily as needed for allergies.    fluticasone (FLONASE) 50 MCG/ACT nasal spray Place 1 spray into both nostrils as needed.    hydrochlorothiazide (HYDRODIURIL) 25 MG tablet Take 25 mg by mouth daily.   ibuprofen (ADVIL,MOTRIN) 200 MG tablet Take 600-800 mg by mouth every 8 (eight) hours as needed for moderate pain (knee).   Magnesium Oxide 500 MG CAPS Take 500 mg by mouth 2 (two) times daily.   rivaroxaban (XARELTO) 20 MG TABS tablet TAKE 1 TABLET(20 MG) BY MOUTH DAILY WITH DINNER    sertraline (ZOLOFT) 100 MG tablet Take 100 mg by mouth daily.      Allergies:   Penicillins   Social History   Tobacco Use   Smoking status: Never Smoker   Smokeless tobacco: Never Used  Substance Use Topics   Alcohol use: Yes    Comment: OCC WINE    Drug use: No     Family Hx: The patient's family history includes Diabetes in his father.  ROS:   Please see the history of present illness.     All other systems reviewed and are negative.   Prior CV studies:   The following studies were reviewed today:  Comprehensive pacemaker download 01/30/2019 Labs from Dr. Ashby Dawes  Labs/Other Tests and Data Reviewed:    EKG:  An ECG dated 03/12/2018 was personally reviewed today and demonstrated:  Atrial paced, ventricular sensed rhythm, long AV delay but otherwise normal tracing Comprehensive pacemaker download from 01/30/2019 Recent Labs: No results found for requested labs within last 8760 hours.  03/12/2019 Normal liver function tests, creatinine 0.93,  glucose 99 Recent Lipid Panel No results found for: CHOL, TRIG, HDL, CHOLHDL, LDLCALC, LDLDIRECT 04/17/2018 LDL 67, HDL 57, total cholesterol 136, triglycerides 60 Wt Readings from Last 3 Encounters:  04/18/19 235 lb (106.6 kg)  03/12/18 241 lb (109.3 kg)  05/31/17 247 lb 9.6 oz (112.3 kg)     Objective:    Vital Signs:  BP 134/78    Pulse 66    Ht 6' (1.829 m)    Wt 235 lb (106.6 kg)    BMI 31.87 kg/m    VITAL SIGNS:  reviewed GEN:  no acute distress EYES:  sclerae anicteric, EOMI - Extraocular Movements Intact RESPIRATORY:  normal respiratory effort, symmetric expansion CARDIOVASCULAR:  no peripheral edema SKIN:  no rash, lesions or ulcers. MUSCULOSKELETAL:  no obvious deformities. NEURO:  alert and oriented x 3, no obvious focal deficit PSYCH:  normal affect  ASSESSMENT & PLAN:    1. AFib: Episodes are very infrequent.  None recently.  Appropriately anticoagulated. CHADSVasc 3 (age 47, HTN).  I do not  think the episodes he had over the summer represented a TIA.  We will get the records of his work-up including echocardiogram from Dr. Mathis Fare office. 2. SSS: He has almost 100% atrial pacing.  We will bring him into our office on December 18 to reset the counters to make sure we have updated heart rate histograms. 3. PM: Normal pacemaker function.  Continue remote downloads every 3 months. 4. NSVT: The episodes are consistently brief and infrequent. 5. HTN: Adequate control 6. Obesity: He is aware of the need to lose weight and is doing his best with diet and exercise.  COVID-19 Education: The signs and symptoms of COVID-19 were discussed with the patient and how to seek care for testing (follow up with PCP or arrange E-visit).  The importance of social distancing was discussed today.  Time:   Today, I have spent 21 minutes with the patient with telehealth technology discussing the above problems.     Medication Adjustments/Labs and Tests Ordered: Current medicines are reviewed at length with the patient today.  Concerns regarding medicines are outlined above.   Tests Ordered: No orders of the defined types were placed in this encounter.   Medication Changes: No orders of the defined types were placed in this encounter.   Follow Up:  In Person December 18, 8AM for in person device check to clear device counters, subsequent follow-up in 12 months  Signed, Sanda Klein, MD  04/18/2019 12:03 PM    [place]

## 2019-04-18 NOTE — Patient Instructions (Signed)
Medication Instructions:  Your physician recommends that you continue on your current medications as directed. Please refer to the Current Medication list given to you today.  *If you need a refill on your cardiac medications before your next appointment, please call your pharmacy*  Lab Work:  None ordered today  If you have labs (blood work) drawn today and your tests are completely normal, you will receive your results only by: Marland Kitchen MyChart Message (if you have MyChart) OR . A paper copy in the mail If you have any lab test that is abnormal or we need to change your treatment, we will call you to review the results.  Testing/Procedures:  None ordered today  Follow-Up: At [facility], you and your health needs are our priority.  As part of our continuing mission to provide you with exceptional heart care, we have created designated Provider Care Teams.  These Care Teams include your primary Cardiologist (physician) and Advanced Practice Providers (APPs -  Physician Assistants and Nurse Practitioners) who all work together to provide you with the care you need, when you need it.  Your next appointment:   12 month(s)  The format for your next appointment:   In Person  Provider:   You may see Sanda Klein, MD or one of the following Advanced Practice Providers on your designated Care Team:    Almyra Deforest, PA-C  Fabian Sharp, [place] or   Roby Lofts, [place]   Other Instructions  Follow up on 05/17/19 at [place] with Dr. Sallyanne Kuster for a pacemaker check.

## 2019-04-24 ENCOUNTER — Other Ambulatory Visit: Payer: Self-pay

## 2019-04-24 DIAGNOSIS — Z20822 Contact with and (suspected) exposure to covid-19: Secondary | ICD-10-CM

## 2019-04-26 LAB — NOVEL CORONAVIRUS, NAA: SARS-CoV-2, NAA: NOT DETECTED

## 2019-05-01 ENCOUNTER — Ambulatory Visit (INDEPENDENT_AMBULATORY_CARE_PROVIDER_SITE_OTHER): Payer: PPO | Admitting: *Deleted

## 2019-05-01 DIAGNOSIS — Z0000 Encounter for general adult medical examination without abnormal findings: Secondary | ICD-10-CM | POA: Diagnosis not present

## 2019-05-01 DIAGNOSIS — R748 Abnormal levels of other serum enzymes: Secondary | ICD-10-CM | POA: Diagnosis not present

## 2019-05-01 DIAGNOSIS — R001 Bradycardia, unspecified: Secondary | ICD-10-CM | POA: Diagnosis not present

## 2019-05-01 DIAGNOSIS — E782 Mixed hyperlipidemia: Secondary | ICD-10-CM | POA: Diagnosis not present

## 2019-05-01 DIAGNOSIS — I10 Essential (primary) hypertension: Secondary | ICD-10-CM | POA: Diagnosis not present

## 2019-05-01 DIAGNOSIS — R7303 Prediabetes: Secondary | ICD-10-CM | POA: Diagnosis not present

## 2019-05-01 LAB — CUP PACEART REMOTE DEVICE CHECK
Battery Remaining Longevity: 72 mo
Battery Remaining Percentage: 74 %
Brady Statistic RA Percent Paced: 90 %
Brady Statistic RV Percent Paced: 4 %
Date Time Interrogation Session: 20201202014100
Implantable Lead Implant Date: 20130708
Implantable Lead Implant Date: 20130708
Implantable Lead Location: 753859
Implantable Lead Location: 753860
Implantable Lead Model: 4136
Implantable Lead Model: 4137
Implantable Lead Serial Number: 29021711
Implantable Lead Serial Number: 29201221
Implantable Pulse Generator Implant Date: 20130708
Lead Channel Impedance Value: 547 Ohm
Lead Channel Impedance Value: 597 Ohm
Lead Channel Pacing Threshold Amplitude: 0.6 V
Lead Channel Pacing Threshold Pulse Width: 0.4 ms
Lead Channel Setting Pacing Amplitude: 2 V
Lead Channel Setting Pacing Amplitude: 2.4 V
Lead Channel Setting Pacing Pulse Width: 0.6 ms
Lead Channel Setting Sensing Sensitivity: 2.5 mV
Pulse Gen Serial Number: 112155

## 2019-05-08 ENCOUNTER — Other Ambulatory Visit (HOSPITAL_COMMUNITY): Payer: Self-pay | Admitting: Internal Medicine

## 2019-05-08 ENCOUNTER — Other Ambulatory Visit: Payer: Self-pay | Admitting: Internal Medicine

## 2019-05-08 DIAGNOSIS — R1011 Right upper quadrant pain: Secondary | ICD-10-CM | POA: Diagnosis not present

## 2019-05-08 DIAGNOSIS — I480 Paroxysmal atrial fibrillation: Secondary | ICD-10-CM | POA: Diagnosis not present

## 2019-05-08 DIAGNOSIS — R945 Abnormal results of liver function studies: Secondary | ICD-10-CM | POA: Diagnosis not present

## 2019-05-08 DIAGNOSIS — Z0000 Encounter for general adult medical examination without abnormal findings: Secondary | ICD-10-CM | POA: Diagnosis not present

## 2019-05-08 DIAGNOSIS — I10 Essential (primary) hypertension: Secondary | ICD-10-CM | POA: Diagnosis not present

## 2019-05-08 DIAGNOSIS — R748 Abnormal levels of other serum enzymes: Secondary | ICD-10-CM | POA: Diagnosis not present

## 2019-05-08 DIAGNOSIS — I495 Sick sinus syndrome: Secondary | ICD-10-CM | POA: Diagnosis not present

## 2019-05-10 ENCOUNTER — Ambulatory Visit
Admission: RE | Admit: 2019-05-10 | Discharge: 2019-05-10 | Disposition: A | Discharge status: Home / Self Care | Payer: PPO | Source: Ambulatory Visit | Attending: Internal Medicine | Admitting: Internal Medicine

## 2019-05-10 ENCOUNTER — Ambulatory Visit (HOSPITAL_COMMUNITY)
Admission: RE | Admit: 2019-05-10 | Discharge: 2019-05-10 | Disposition: A | Discharge status: Home / Self Care | Payer: PPO | Source: Ambulatory Visit | Attending: Internal Medicine | Admitting: Internal Medicine

## 2019-05-10 ENCOUNTER — Other Ambulatory Visit: Payer: Self-pay | Admitting: Internal Medicine

## 2019-05-10 ENCOUNTER — Other Ambulatory Visit: Payer: Self-pay

## 2019-05-10 DIAGNOSIS — R160 Hepatomegaly, not elsewhere classified: Secondary | ICD-10-CM

## 2019-05-10 DIAGNOSIS — R1011 Right upper quadrant pain: Secondary | ICD-10-CM | POA: Diagnosis not present

## 2019-05-10 DIAGNOSIS — K7689 Other specified diseases of liver: Secondary | ICD-10-CM | POA: Diagnosis not present

## 2019-05-10 DIAGNOSIS — N200 Calculus of kidney: Secondary | ICD-10-CM | POA: Diagnosis not present

## 2019-05-10 MED ORDER — IOPAMIDOL (ISOVUE-300) INJECTION 61%
125.0000 mL | Freq: Once | INTRAVENOUS | Status: AC | PRN
  Administered 2019-05-10: 125 mL via INTRAVENOUS

## 2019-05-13 ENCOUNTER — Encounter: Payer: Self-pay | Admitting: Cardiovascular Disease

## 2019-05-13 ENCOUNTER — Other Ambulatory Visit: Payer: Self-pay

## 2019-05-13 ENCOUNTER — Ambulatory Visit: Payer: PPO | Admitting: Cardiovascular Disease

## 2019-05-13 VITALS — BP 124/82 | HR 78 | Ht 72.0 in | Wt 238.4 lb

## 2019-05-13 DIAGNOSIS — I10 Essential (primary) hypertension: Secondary | ICD-10-CM | POA: Diagnosis not present

## 2019-05-13 DIAGNOSIS — I495 Sick sinus syndrome: Secondary | ICD-10-CM

## 2019-05-13 DIAGNOSIS — R160 Hepatomegaly, not elsewhere classified: Secondary | ICD-10-CM

## 2019-05-13 DIAGNOSIS — I480 Paroxysmal atrial fibrillation: Secondary | ICD-10-CM

## 2019-05-13 DIAGNOSIS — Z7901 Long term (current) use of anticoagulants: Secondary | ICD-10-CM

## 2019-05-13 DIAGNOSIS — I472 Ventricular tachycardia: Secondary | ICD-10-CM

## 2019-05-13 DIAGNOSIS — I4729 Other ventricular tachycardia: Secondary | ICD-10-CM

## 2019-05-13 DIAGNOSIS — Z950 Presence of cardiac pacemaker: Secondary | ICD-10-CM | POA: Diagnosis not present

## 2019-05-13 NOTE — Progress Notes (Signed)
Cardiology Office Note    Date:  05/15/2019   ID:  Dionte, Fullenkamp [birth year], MRN [id number]  PCP:  Merrilee Seashore, MD  Cardiologist:   Sanda Klein, MD   Chief Complaint  Patient presents with  . Pacemaker Check  . Atrial Fibrillation    History of Present Illness:  ALLEC CLAYDON is a 73 y.o. male with HTN, paroxysmal atrial fibrillation and symptomatic sinus node dysfunction who received a dual-chamber permanent pacemaker in 2013 and returns for follow-up. He does not have significant structural heart disease.   The patient specifically denies any chest pain at rest exertion, dyspnea at rest or with exertion, orthopnea, paroxysmal nocturnal dyspnea, syncope, palpitations, focal neurological deficits, intermittent claudication, lower extremity edema, unexplained weight gain, cough, hemoptysis or wheezing.  Reports that when he tries to work or exercise he has a hard time warming up, but eventually is able to perform fairly intense physical tasks without limitations.  Several months ago he fell over the handle of his leaf blower and had anterior chest wall tenderness and pain for for 5 days.  This resolved spontaneously.  His labs performed with Dr. Ashby Dawes showed an elevation in his alkaline phosphatase.  Other liver tests were normal.  He does not have gallstones.  He subsequently underwent CT and ultrasonography of the abdomen.  He has a 13x11 cm right lobe of the liver mass.  This appears to be most consistent with a simple cyst on CT, but the ultrasound suggested a more complex lesion.  A 5 cm cyst was seen on previous imaging but the radiology report specifically points out that this is a new finding.  The differential diagnosis includes hepatic cyst with internal hemorrhage (may be after his fall ?)  Versus cystic neoplasm such as biliary cystadenoma, but the CT impression was that this was not suggestive of malignancy.  Interrogation of his pacemaker shows  normal device function. Battery status is good, with estimated longevity of 5.5 years.  Atrial fibrillation has not been recorded in almost 4 years now.  He has mostly atrial paced, ventricular sensed rhythm.  The underlying rhythm today is an accelerated junctional rhythm (superimposed a and V electrograms) at about 60 bpm.  He has about 90% atrial pacing with excellent heart rate histogram distribution.  He has virtually no ventricular pacing.  Counters were reset today.   Past Medical History:  Diagnosis Date  . AF (paroxysmal atrial fibrillation) ([place])   . Arthritis   . Cancer [facility])    SKIN CANCER  . Depression   . Hypertension   . Pacemaker 11/2011   [employer] for [employer]  . Sinus node dysfunction [facility])    [employer] pacemaker 12/05/11    Past Surgical History:  Procedure Laterality Date  . FINGER SURGERY     LEFT THUMB   11/2016  . INSERTION OF MESH N/A 05/31/2017   Procedure: INSERTION OF MESH;  Surgeon: Donnie Mesa, MD;  Location: [place];  Service: General;  Laterality: N/A;  . PERMANENT PACEMAKER INSERTION  12/05/2011   [employer]  . PERMANENT PACEMAKER INSERTION N/A 12/05/2011   Procedure: PERMANENT PACEMAKER INSERTION;  Surgeon: Sanda Klein, MD;  Location: [place] CATH LAB;  Service: Cardiovascular;  Laterality: N/A;  . REPLACEMENT TOTAL KNEE  03/23/2009   right  . UMBILICAL HERNIA REPAIR N/A 05/31/2017   Procedure: HERNIA REPAIR UMBILICAL ERAS PATHWAY;  Surgeon: Donnie Mesa, MD;  Location: [place];  Service: General;  Laterality: N/A;  LMA ONLY  .  US ECHOCARDIOGRAPHY  11/01/2011   mildly dilated LA    Current Medications: Outpatient Medications Prior to Visit  Medication Sig Dispense Refill  . allopurinol (ZYLOPRIM) 100 MG tablet Take 100 mg by mouth daily.  9  . atorvastatin (LIPITOR) 20 MG tablet Take 20 mg by mouth daily at 6 PM. Take 1 tab daily in the evening  2  . benazepril (LOTENSIN) 20 MG tablet Take 20 mg by mouth daily.    Marland Kitchen buPROPion (WELLBUTRIN  SR) 150 MG 12 hr tablet Take 150 mg by mouth 2 (two) times daily.    . clindamycin (CLEOCIN) 300 MG capsule Take 600 mg by mouth See admin instructions. For dental prodcures prn    . clonazePAM (KLONOPIN) 0.5 MG tablet Take 0.5 mg by mouth daily as needed for anxiety.     . fexofenadine (ALLEGRA) 180 MG tablet Take 180 mg by mouth daily as needed for allergies.     . fluticasone (FLONASE) 50 MCG/ACT nasal spray Place 1 spray into both nostrils as needed.     . hydrochlorothiazide (HYDRODIURIL) 25 MG tablet Take 25 mg by mouth daily.    Marland Kitchen ibuprofen (ADVIL,MOTRIN) 200 MG tablet Take 600-800 mg by mouth every 8 (eight) hours as needed for moderate pain (knee).    . Magnesium Oxide 500 MG CAPS Take 500 mg by mouth 2 (two) times daily.    . Multiple Vitamin (MULTIVITAMIN WITH MINERALS) TABS Take 1 tablet by mouth daily.    . rivaroxaban (XARELTO) 20 MG TABS tablet TAKE 1 TABLET(20 MG) BY MOUTH DAILY WITH DINNER 90 tablet 1  . sertraline (ZOLOFT) 100 MG tablet Take 100 mg by mouth daily.      No facility-administered medications prior to visit.     Allergies:   Penicillins   Social History   Socioeconomic History  . Marital status: Married    Spouse name: Not on file  . Number of children: Not on file  . Years of education: Not on file  . Highest education level: Not on file  Occupational History  . Not on file  Tobacco Use  . Smoking status: Never Smoker  . Smokeless tobacco: Never Used  Substance and Sexual Activity  . Alcohol use: Yes    Comment: OCC WINE   . Drug use: No  . Sexual activity: Yes    Partners: Female  Other Topics Concern  . Not on file  Social History Narrative  . Not on file   Social Determinants of Health   Financial Resource Strain:   . Difficulty of Paying Living Expenses: Not on file  Food Insecurity:   . Worried About Charity fundraiser in the Last Year: Not on file  . Ran Out of Food in the Last Year: Not on file  Transportation Needs:   . Lack of  Transportation (Medical): Not on file  . Lack of Transportation (Non-Medical): Not on file  Physical Activity:   . Days of Exercise per Week: Not on file  . Minutes of Exercise per Session: Not on file  Stress:   . Feeling of Stress : Not on file  Social Connections:   . Frequency of Communication with Friends and Family: Not on file  . Frequency of Social Gatherings with Friends and Family: Not on file  . Attends Religious Services: Not on file  . Active Member of Clubs or Organizations: Not on file  . Attends Archivist Meetings: Not on file  . Marital Status: Not  on file     Family History:  The patient's family history includes Diabetes in his father.   ROS:   Please see the history of present illness.    ROS All other systems reviewed and are negative.   PHYSICAL EXAM:   VS:  BP 124/82   Pulse 78   Ht 6' (1.829 m)   Wt 238 lb 6.4 oz (108.1 kg)   BMI 32.33 kg/m    GEN: Well nourished, well developed, in no acute distress  HEENT: normal  Neck: no JVD, carotid bruits, or masses Cardiac: RRR; no murmurs, rubs, or gallops,no edema , healthy left subclavian pacemaker site Respiratory:  clear to auscultation bilaterally, normal work of breathing GI: soft, nontender, nondistended, + BS MS: no deformity or atrophy  Skin: warm and dry, no rash Neuro:  Alert and Oriented x 3, Strength and sensation are intact Psych: euthymic mood, full affect  Wt Readings from Last 3 Encounters:  05/13/19 238 lb 6.4 oz (108.1 kg)  04/18/19 235 lb (106.6 kg)  03/12/18 241 lb (109.3 kg)      Studies/Labs Reviewed:   EKG:  EKG is ordered today.  Shows atrial paced, ventricular sensed rhythm, otherwise normal tracing.  QTc 437 ms.  ASSESSMENT:    1. Paroxysmal atrial fibrillation (HCC)   2. Long term current use of anticoagulant   3. SSS (sick sinus syndrome) ([place])   4. Pacemaker, dual-chamber [employer], sinus node dysfunction, implanted 12/05/11   5. NSVT (nonsustained  ventricular tachycardia) (HCC)   6. Essential hypertension   7. Mass of right lobe of liver      PLAN:  In order of problems listed above:  1. AFib: Extremely low prevalence of arrhythmia.  If there is any need for invasive procedures or suspicion of bleeding complications, interrupting his anticoagulant should be associated with a very low risk of embolic complications.  CHA2DS2-VASc 3 (age 88, hypertension). 2. Anticoagulation: He has not had overt bleeding complications on Xarelto, unless his liver mass is indeed a primary cyst complicated by hemorrhage.   On Xarelto. 3. SSS: Heart rate histograms look great, but he reports some issues with "warming up".  Decreased his accelerometer threshold to low, to speed up his heart rate adjustment to physical activity. 4. PPM: Normal device function.  He is not pacemaker dependent.  Remote downloads every 3 months. 5. NSVT: Rare and asymptomatic events recorded by his device in the past, none recently. 6. HTN: Well-controlled on current medications. 7. Liver mass: Most indication suggest this is a benign abnormality.  Work-up is in progress, as mentioned above it would be a low risk proposition to interrupt his anticoagulant as needed.    Medication Adjustments/Labs and Tests Ordered: Current medicines are reviewed at length with the patient today.  Concerns regarding medicines are outlined above.  Medication changes, Labs and Tests ordered today are listed in the Patient Instructions below. Patient Instructions  Medication Instructions:  Samples given of Xarelto 20 mg once daily-lot: [id number] (exp. 11/21)  *If you need a refill on your cardiac medications before your next appointment, please call your pharmacy*  Lab Work: None ordered If you have labs (blood work) drawn today and your tests are completely normal, you will receive your results only by: Marland Kitchen MyChart Message (if you have MyChart) OR . A paper copy in the mail If you have any lab  test that is abnormal or we need to change your treatment, we will call you to review the results.  Testing/Procedures: None ordered  Follow-Up: At [facility], you and your health needs are our priority.  As part of our continuing mission to provide you with exceptional heart care, we have created designated Provider Care Teams.  These Care Teams include your primary Cardiologist (physician) and Advanced Practice Providers (APPs -  Physician Assistants and Nurse Practitioners) who all work together to provide you with the care you need, when you need it.  Your next appointment:   12 month(s)  The format for your next appointment:   In Person  Provider:   Sanda Klein, MD       Signed, Sanda Klein, MD  05/15/2019 8:52 AM    [address], [place], [place]  [address] Phone: [phone]; Fax: [phone]

## 2019-05-13 NOTE — Patient Instructions (Signed)
Medication Instructions:  Samples given of Xarelto 20 mg once daily-lot: [id number] (exp. 11/21)  *If you need a refill on your cardiac medications before your next appointment, please call your pharmacy*  Lab Work: None ordered If you have labs (blood work) drawn today and your tests are completely normal, you will receive your results only by: Marland Kitchen MyChart Message (if you have MyChart) OR . A paper copy in the mail If you have any lab test that is abnormal or we need to change your treatment, we will call you to review the results.  Testing/Procedures: None ordered  Follow-Up: At [facility], you and your health needs are our priority.  As part of our continuing mission to provide you with exceptional heart care, we have created designated Provider Care Teams.  These Care Teams include your primary Cardiologist (physician) and Advanced Practice Providers (APPs -  Physician Assistants and Nurse Practitioners) who all work together to provide you with the care you need, when you need it.  Your next appointment:   12 month(s)  The format for your next appointment:   In Person  Provider:   Sanda Klein, MD

## 2019-05-15 ENCOUNTER — Other Ambulatory Visit: Payer: Self-pay | Admitting: Gastroenterology

## 2019-05-15 ENCOUNTER — Encounter: Payer: Self-pay | Admitting: Cardiovascular Disease

## 2019-05-15 DIAGNOSIS — R748 Abnormal levels of other serum enzymes: Secondary | ICD-10-CM | POA: Diagnosis not present

## 2019-05-15 DIAGNOSIS — R932 Abnormal findings on diagnostic imaging of liver and biliary tract: Secondary | ICD-10-CM | POA: Diagnosis not present

## 2019-05-15 DIAGNOSIS — R7402 Elevation of levels of lactic acid dehydrogenase (LDH): Secondary | ICD-10-CM

## 2019-05-15 DIAGNOSIS — R7401 Elevation of levels of liver transaminase levels: Secondary | ICD-10-CM

## 2019-05-16 DIAGNOSIS — R932 Abnormal findings on diagnostic imaging of liver and biliary tract: Secondary | ICD-10-CM | POA: Diagnosis not present

## 2019-05-17 ENCOUNTER — Encounter: Payer: PPO | Admitting: Cardiovascular Disease

## 2019-05-17 DIAGNOSIS — M25562 Pain in left knee: Secondary | ICD-10-CM | POA: Diagnosis not present

## 2019-05-17 DIAGNOSIS — M1712 Unilateral primary osteoarthritis, left knee: Secondary | ICD-10-CM | POA: Diagnosis not present

## 2019-05-27 NOTE — Progress Notes (Signed)
PPM remote 

## 2019-05-28 ENCOUNTER — Other Ambulatory Visit (HOSPITAL_COMMUNITY): Payer: Self-pay | Admitting: Gastroenterology

## 2019-05-28 ENCOUNTER — Ambulatory Visit: Payer: PPO | Attending: Internal Medicine

## 2019-05-28 DIAGNOSIS — Z20828 Contact with and (suspected) exposure to other viral communicable diseases: Secondary | ICD-10-CM | POA: Diagnosis not present

## 2019-05-28 DIAGNOSIS — R7402 Elevation of levels of lactic acid dehydrogenase (LDH): Secondary | ICD-10-CM

## 2019-05-28 DIAGNOSIS — R7401 Elevation of levels of liver transaminase levels: Secondary | ICD-10-CM

## 2019-05-28 DIAGNOSIS — Z20822 Contact with and (suspected) exposure to covid-19: Secondary | ICD-10-CM

## 2019-05-28 DIAGNOSIS — R932 Abnormal findings on diagnostic imaging of liver and biliary tract: Secondary | ICD-10-CM

## 2019-05-29 ENCOUNTER — Other Ambulatory Visit: Payer: PPO

## 2019-05-29 ENCOUNTER — Other Ambulatory Visit: Payer: Self-pay | Admitting: Cardiovascular Disease

## 2019-05-29 LAB — NOVEL CORONAVIRUS, NAA: SARS-CoV-2, NAA: NOT DETECTED

## 2019-05-30 ENCOUNTER — Telehealth: Payer: Self-pay | Admitting: Cardiovascular Disease

## 2019-05-30 NOTE — Telephone Encounter (Signed)
Barb calling from Dr. Mathis Fare office stating the patient needs an MRI, but he has a pacemaker. She says the patient mentioned the pacemaker may not be working right and she would like to know if they should wait for an MRI if the pacemaker is removed or order a CT for the patient.

## 2019-05-30 NOTE — Telephone Encounter (Signed)
George Wolf has been made aware. She has been advised to call the office back if she needs any further help in getting the MRI scheduled.

## 2019-05-30 NOTE — Telephone Encounter (Signed)
Left a message for Barb to call back.

## 2019-05-30 NOTE — Telephone Encounter (Signed)
This gentleman really needs a liver MRI to clarify his mass etiology. He has a [employer] [id number] w 4136/4137 leads, no abandoned leads, not dependent. Reports [facility] turned him down for a scan even "with a rep there". What do you think? Suggestions? Anyone in radiology we can speak to? Happy New Year!

## 2019-05-30 NOTE — Telephone Encounter (Signed)
We have scanned folks without MRI conditional systems in the past.  The patient has to sign a consent stating they understand the risks and a physician has to be there for the MRI scan.  (Dr Caryl Comes has a patient he does this for every few months).  George Wolf is the AD over radiology and can probably help arrange.  Chanetta Marshall, NP 05/30/2019 3:18 PM

## 2019-05-30 NOTE — Telephone Encounter (Signed)
Thank You.

## 2019-05-30 NOTE — Telephone Encounter (Signed)
Barb called from Dr. Mathis Fare office. She stated the the patient will need a MRI of the liver to look at some lesions. She has tried several different places, including [place], and no one is willing to do an MRI with the pacemaker (even with a rep there)  She is going to see if the patient will do a CT scan but this may not be as effective as the MRI.   She has been advised to keep [place] posted and we can help if needed.

## 2019-06-04 ENCOUNTER — Other Ambulatory Visit (HOSPITAL_COMMUNITY): Payer: Self-pay | Admitting: Gastroenterology

## 2019-06-04 DIAGNOSIS — K769 Liver disease, unspecified: Secondary | ICD-10-CM

## 2019-06-04 DIAGNOSIS — R160 Hepatomegaly, not elsewhere classified: Secondary | ICD-10-CM | POA: Diagnosis not present

## 2019-06-05 ENCOUNTER — Encounter (HOSPITAL_COMMUNITY): Payer: Self-pay

## 2019-06-05 ENCOUNTER — Other Ambulatory Visit (HOSPITAL_COMMUNITY): Payer: Self-pay | Admitting: Cardiovascular Disease

## 2019-06-05 ENCOUNTER — Encounter: Payer: Self-pay | Admitting: Cardiovascular Disease

## 2019-06-05 ENCOUNTER — Telehealth: Payer: Self-pay | Admitting: Cardiovascular Disease

## 2019-06-05 NOTE — Telephone Encounter (Signed)
° °  [facility] Pre-operative Risk Assessment    Request for surgical clearance:  1. What type of surgery is being performed? Ultrasound Aspiration of hepatic cystic lesion.   2. When is this surgery scheduled? 06/07/2019  3. What type of clearance is required (medical clearance vs. Pharmacy clearance to hold med vs. Both)?Pharmacy   4. Are there any medications that need to be held prior to surgery and how long? 1 day    5. Practice name and name of physician performing surgery? [facility] / Dr. Vernard Gambles  6. What is your office phone number [phone]   7.   What is your office fax number [phone]  8.   Anesthesia type (None, local, MAC, general) ? moderate sedation   George Wolf 06/05/2019, 3:11 PM  _________________________________________________________________   (provider comments below)

## 2019-06-05 NOTE — Telephone Encounter (Signed)
Patient with diagnosis of Atrial fibrillation on Eliquis for anticoagulation.    Procedure: Aspiration of hepatic cyst Date of procedure: 06/07/2019  CHADS2-VASc score of  3 (HTN, AGE x 2)  Per office protocol, patient can hold ELIQUIS for 2 days prior to procedure.    Please resume as soon as possible after procedure.

## 2019-06-05 NOTE — Progress Notes (Signed)
George Wolf. [place] Male, 74 y.o., Nov 24, 1945 MRN:  [id number] Phone:  [phone] [name]) PCP:  Merrilee Seashore, MD Coverage:  [employer] Ppo Next Appt With Radiology (MC-US 2) 06/07/2019 at 8:00 AM  RE: Blood Thinners Received: Today Message Contents  Clarene Essex, MD  Croitoru, Dani Gobble, MD   Cc: Ernestene Mention w me thx   Previous Messages  ----- Message -----  From: [name]  Sent: 06/05/2019  2:54 PM EST  To: Sanda Klein, MD, Clarene Essex, MD  Subject: Blood Thinners                  Hello,   I have scheduled [name] to have his US Aspiration this Friday January 8th.  He is on the Blood thinner Xarelto, He needs to hold it 1 day prior to the Biopsy.  I need your permission to instruct him.

## 2019-06-05 NOTE — Telephone Encounter (Signed)
   Primary Cardiologist: Sanda Klein, MD  Chart reviewed as part of pre-operative protocol coverage. Per pharmacy recommendations, patient can hold eliquis 2 days prior to her upcoming procedure and should restart when cleared to do so by Dr. Vernard Gambles.   I will route this recommendation to the requesting party via Epic fax function and remove from pre-op pool.  Please call with questions.  Abigail Butts, PA-C 06/05/2019, 5:04 PM

## 2019-06-05 NOTE — Progress Notes (Signed)
George Wolf. [place] Male, 74 y.o., 1946-04-19 MRN:  [id number] Phone:  [phone] [name]) PCP:  Merrilee Seashore, MD Coverage:  [employer] Ppo Next Appt With Radiology ([facility]-US 2) 06/07/2019 at 8:00 AM  RE: Biopsy Received: Yesterday Message Contents  Arne Cleveland, MD  ,  E      Ok   US aspiration hepatic cystic lesion     * ** NOTE ** *   send for cytology  culture and sensitivity graham stain    ** ** **      DDH   Previous Messages  ----- Message -----  From: [name]  Sent: 06/04/2019 10:23 AM EST  To: Ir Procedure Requests  Subject: Biopsy                      Procedure Requested: Percutaneous needle aspiration    Reason for Procedure: Right hepatic lobe cystic lesion    Provider Requesting: Clarene Essex  Provider Telephone: [phone]    Other Info: Please send for cytology  culture and sensitivity graham stain

## 2019-06-06 ENCOUNTER — Other Ambulatory Visit: Payer: Self-pay | Admitting: Radiology

## 2019-06-06 ENCOUNTER — Other Ambulatory Visit: Payer: Self-pay | Admitting: Student

## 2019-06-07 ENCOUNTER — Ambulatory Visit (HOSPITAL_COMMUNITY)
Admission: RE | Admit: 2019-06-07 | Discharge: 2019-06-07 | Disposition: A | Discharge status: Home / Self Care | Payer: PPO | Source: Ambulatory Visit | Attending: Gastroenterology | Admitting: Gastroenterology

## 2019-06-07 ENCOUNTER — Encounter (HOSPITAL_COMMUNITY): Payer: Self-pay

## 2019-06-07 ENCOUNTER — Other Ambulatory Visit: Payer: Self-pay

## 2019-06-07 DIAGNOSIS — M1990 Unspecified osteoarthritis, unspecified site: Secondary | ICD-10-CM | POA: Diagnosis not present

## 2019-06-07 DIAGNOSIS — Z96651 Presence of right artificial knee joint: Secondary | ICD-10-CM | POA: Diagnosis not present

## 2019-06-07 DIAGNOSIS — I480 Paroxysmal atrial fibrillation: Secondary | ICD-10-CM | POA: Insufficient documentation

## 2019-06-07 DIAGNOSIS — Z85828 Personal history of other malignant neoplasm of skin: Secondary | ICD-10-CM | POA: Diagnosis not present

## 2019-06-07 DIAGNOSIS — Z950 Presence of cardiac pacemaker: Secondary | ICD-10-CM | POA: Diagnosis not present

## 2019-06-07 DIAGNOSIS — K769 Liver disease, unspecified: Secondary | ICD-10-CM | POA: Diagnosis not present

## 2019-06-07 DIAGNOSIS — Z79899 Other long term (current) drug therapy: Secondary | ICD-10-CM | POA: Insufficient documentation

## 2019-06-07 DIAGNOSIS — Z7901 Long term (current) use of anticoagulants: Secondary | ICD-10-CM | POA: Insufficient documentation

## 2019-06-07 DIAGNOSIS — K7689 Other specified diseases of liver: Secondary | ICD-10-CM | POA: Diagnosis not present

## 2019-06-07 DIAGNOSIS — I10 Essential (primary) hypertension: Secondary | ICD-10-CM | POA: Diagnosis not present

## 2019-06-07 LAB — CBC
HCT: 40.6 % (ref 39.0–52.0)
Hemoglobin: 13.4 g/dL (ref 13.0–17.0)
MCH: 31.2 pg (ref 26.0–34.0)
MCHC: 33 g/dL (ref 30.0–36.0)
MCV: 94.6 fL (ref 80.0–100.0)
Platelets: 197 10*3/uL (ref 150–400)
RBC: 4.29 MIL/uL (ref 4.22–5.81)
RDW: 13.2 % (ref 11.5–15.5)
WBC: 5.8 10*3/uL (ref 4.0–10.5)
nRBC: 0 % (ref 0.0–0.2)

## 2019-06-07 LAB — PROTIME-INR
INR: 1 (ref 0.8–1.2)
Prothrombin Time: 13.4 seconds (ref 11.4–15.2)

## 2019-06-07 MED ORDER — FENTANYL CITRATE (PF) 100 MCG/2ML IJ SOLN
INTRAMUSCULAR | Status: AC | PRN
  Administered 2019-06-07 (×2): 50 ug via INTRAVENOUS

## 2019-06-07 MED ORDER — SODIUM CHLORIDE 0.9 % IV SOLN
INTRAVENOUS | Status: AC | PRN
  Administered 2019-06-07: 10 mL/h via INTRAVENOUS

## 2019-06-07 MED ORDER — MIDAZOLAM HCL 2 MG/2ML IJ SOLN
INTRAMUSCULAR | Status: AC
  Filled 2019-06-07: qty 2

## 2019-06-07 MED ORDER — FENTANYL CITRATE (PF) 100 MCG/2ML IJ SOLN
INTRAMUSCULAR | Status: AC
  Filled 2019-06-07: qty 2

## 2019-06-07 MED ORDER — SODIUM CHLORIDE 0.9 % IV SOLN: INTRAVENOUS | Status: DC

## 2019-06-07 MED ORDER — MIDAZOLAM HCL 2 MG/2ML IJ SOLN
INTRAMUSCULAR | Status: AC | PRN
  Administered 2019-06-07 (×2): 1 mg via INTRAVENOUS

## 2019-06-07 MED ORDER — LIDOCAINE HCL (PF) 1 % IJ SOLN
INTRAMUSCULAR | Status: AC
  Filled 2019-06-07: qty 30

## 2019-06-07 MED ORDER — GELATIN ABSORBABLE 12-7 MM EX MISC
CUTANEOUS | Status: AC
  Filled 2019-06-07: qty 1

## 2019-06-07 NOTE — Procedures (Signed)
Interventional Radiology Procedure:   Indications: Enlarging liver lesion  Procedure: US guided liver lesion biopsy/aspiration  Findings: Complex cystic lesion.  Aspirated 25 ml of dark red / brown fluid.  Performed two core biopsies in "solid" areas but did not yield any tissue.  Complications: None     EBL: less than 10 ml  Plan: Bedrest 3 hours, then discharge to home.     R. Anselm Pancoast, MD  Pager: [phone]

## 2019-06-07 NOTE — H&P (Signed)
Chief Complaint: Patient was seen in consultation today for liver lesion  Referring Physician(s): Magod,Marc  Supervising Physician: Markus Daft  Patient Status: [facility] - Out-pt  History of Present Illness: George Wolf is a 74 y.o. male with past medical history of arthritis, HTN, sinus node dysfunction s/p pacemaker placement in 2013, a fib on Xarelto who experienced several day history of nausea and right upper quadrant pain in November 2020.  He was evaluated at his GI office and sent for US Abdomen as well as CT Abdomen Pelvis which revealed a large, complex cystic and solid lesion in the central liver. IR now consulted for liver lesion biopsy at the request of Dr. Watt Climes.  Patient presents to radiology in his usual state of health today.  He denies fever, chills, abdominal pain, nausea, vomiting, dysuria.  Reports a short course of symptoms in November and none since.   He has held his Xarelto since Wednesday.  He has been NPO this AM.   Past Medical History:  Diagnosis Date  . AF (paroxysmal atrial fibrillation) ([place])   . Arthritis   . Cancer [facility])    SKIN CANCER  . Depression   . Hypertension   . Pacemaker 11/2011   [employer] for [employer]  . Sinus node dysfunction [facility])    [employer] pacemaker 12/05/11    Past Surgical History:  Procedure Laterality Date  . FINGER SURGERY     LEFT THUMB   11/2016  . INSERTION OF MESH N/A 05/31/2017   Procedure: INSERTION OF MESH;  Surgeon: Donnie Mesa, MD;  Location: [place];  Service: General;  Laterality: N/A;  . PERMANENT PACEMAKER INSERTION  12/05/2011   [employer]  . PERMANENT PACEMAKER INSERTION N/A 12/05/2011   Procedure: PERMANENT PACEMAKER INSERTION;  Surgeon: Sanda Klein, MD;  Location: [place] CATH LAB;  Service: Cardiovascular;  Laterality: N/A;  . REPLACEMENT TOTAL KNEE  03/23/2009   right  . UMBILICAL HERNIA REPAIR N/A 05/31/2017   Procedure: HERNIA REPAIR UMBILICAL ERAS PATHWAY;  Surgeon: Donnie Mesa,  MD;  Location: [place];  Service: General;  Laterality: N/A;  LMA ONLY  . US ECHOCARDIOGRAPHY  11/01/2011   mildly dilated LA    Allergies: Penicillins  Medications: Prior to Admission medications   Medication Sig Start Date End Date Taking? Authorizing Provider  allopurinol (ZYLOPRIM) 100 MG tablet Take 100 mg by mouth daily. 11/30/14  Yes [provider]  atorvastatin (LIPITOR) 20 MG tablet Take 20 mg by mouth daily at 6 PM. Take 1 tab daily in the evening 03/11/15  Yes [provider]  benazepril (LOTENSIN) 20 MG tablet Take 20 mg by mouth daily.   Yes [provider]  buPROPion (WELLBUTRIN SR) 150 MG 12 hr tablet Take 150 mg by mouth 2 (two) times daily.   Yes [provider]  clonazePAM (KLONOPIN) 0.5 MG tablet Take 0.5 mg by mouth daily as needed for anxiety.  11/13/12  Yes [provider]  fexofenadine (ALLEGRA) 180 MG tablet Take 180 mg by mouth daily as needed for allergies.    Yes [provider]  hydrochlorothiazide (HYDRODIURIL) 25 MG tablet Take 25 mg by mouth daily.   Yes [provider]  Magnesium Oxide 500 MG CAPS Take 500 mg by mouth 2 (two) times daily.   Yes [provider]  sertraline (ZOLOFT) 100 MG tablet Take 100 mg by mouth daily.    Yes [provider]  clindamycin (CLEOCIN) 300 MG capsule Take 600 mg by mouth  See admin instructions. For dental prodcures prn 02/08/15   [provider]  fluticasone (FLONASE) 50 MCG/ACT nasal spray Place 1 spray into both nostrils as needed.     [provider]  ibuprofen (ADVIL,MOTRIN) 200 MG tablet Take 600-800 mg by mouth every 8 (eight) hours as needed for moderate pain (knee).    [provider]  Multiple Vitamin (MULTIVITAMIN WITH MINERALS) TABS Take 1 tablet by mouth daily.    [provider]  rivaroxaban (XARELTO) 20 MG TABS tablet TAKE 1 TABLET(20 MG) BY MOUTH DAILY WITH DINNER 02/21/19   Croitoru, Mihai, MD     Family  History  Problem Relation Age of Onset  . Diabetes Father     Social History   Socioeconomic History  . Marital status: Married    Spouse name: Not on file  . Number of children: Not on file  . Years of education: Not on file  . Highest education level: Not on file  Occupational History  . Not on file  Tobacco Use  . Smoking status: Never Smoker  . Smokeless tobacco: Never Used  Substance and Sexual Activity  . Alcohol use: Yes    Comment: OCC WINE   . Drug use: No  . Sexual activity: Yes    Partners: Female  Other Topics Concern  . Not on file  Social History Narrative  . Not on file   Social Determinants of Health   Financial Resource Strain:   . Difficulty of Paying Living Expenses: Not on file  Food Insecurity:   . Worried About Charity fundraiser in the Last Year: Not on file  . Ran Out of Food in the Last Year: Not on file  Transportation Needs:   . Lack of Transportation (Medical): Not on file  . Lack of Transportation (Non-Medical): Not on file  Physical Activity:   . Days of Exercise per Week: Not on file  . Minutes of Exercise per Session: Not on file  Stress:   . Feeling of Stress : Not on file  Social Connections:   . Frequency of Communication with Friends and Family: Not on file  . Frequency of Social Gatherings with Friends and Family: Not on file  . Attends Religious Services: Not on file  . Active Member of Clubs or Organizations: Not on file  . Attends Archivist Meetings: Not on file  . Marital Status: Not on file     Review of Systems: A 12 point ROS discussed and pertinent positives are indicated in the HPI above.  All other systems are negative.  Review of Systems  Constitutional: Negative for fatigue and fever.  Respiratory: Negative for cough and shortness of breath.   Cardiovascular: Negative for chest pain.  Gastrointestinal: Negative for abdominal pain, constipation, diarrhea, nausea and vomiting.  Genitourinary:  Negative for dysuria.  Musculoskeletal: Negative for back pain.  Psychiatric/Behavioral: Negative for behavioral problems and confusion.    Vital Signs: BP 140/87   Pulse 63   Temp 98.3 F (36.8 C) (Oral)   Resp 16   Ht 6' (1.829 m)   Wt 235 lb (106.6 kg)   SpO2 97%   BMI 31.87 kg/m   Physical Exam Vitals and nursing note reviewed.  Constitutional:      General: He is not in acute distress.    Appearance: Normal appearance.  HENT:     Mouth/Throat:     Mouth: Mucous membranes are moist.     Pharynx: Oropharynx is clear.  Cardiovascular:     Rate and Rhythm: Normal rate and regular rhythm.     Pulses: Normal pulses.  Pulmonary:     Effort: Pulmonary effort is normal. No respiratory distress.     Breath sounds: Normal breath sounds.  Abdominal:     General: Abdomen is flat. There is no distension.     Palpations: Abdomen is soft.     Tenderness: There is no abdominal tenderness.  Skin:    General: Skin is warm and dry.  Neurological:     General: No focal deficit present.     Mental Status: He is alert and oriented to person, place, and time. Mental status is at baseline.  Psychiatric:        Mood and Affect: Mood normal.        Behavior: Behavior normal.        Thought Content: Thought content normal.        Judgment: Judgment normal.      MD Evaluation Airway: WNL Heart: WNL Abdomen: WNL Chest/ Lungs: WNL ASA  Classification: 3 Mallampati/Airway Score: One   Imaging: CT ABDOMEN PELVIS W CONTRAST  Result Date: 05/10/2019 CLINICAL DATA:  Right upper quadrant pain. Elevated liver function tests. Liver lesion on recent ultrasound. EXAM: CT ABDOMEN AND PELVIS WITH CONTRAST TECHNIQUE: Multidetector CT imaging of the abdomen and pelvis was performed using the standard protocol following bolus administration of intravenous contrast. CONTRAST:  115mL ISOVUE-300 IOPAMIDOL (ISOVUE-300) INJECTION 61% COMPARISON:  CT on 03/18/2014 FINDINGS: Lower Chest: No acute  findings. Hepatobiliary: A few tiny sub-cm left hepatic lobe cysts are again seen. A large cystic lesion is seen in the right hepatic lobe which is significantly increased in size since previous study, currently measuring 13.4 x 11.0 cm, compared with 6.1 x 5.2 cm in 2015. This cystic lesion now shows mild mural soft tissue thickening, but no other internal complex features are seen. No solid component identified. Gallbladder is unremarkable. No evidence of biliary ductal dilatation. Pancreas:  No mass or inflammatory changes. Spleen: Within normal limits in size and appearance. Adrenals/Urinary Tract: Multiple small bilateral renal cysts are again seen. No renal masses are identified. A 3 mm calculus is again seen in the lower pole of the left kidney, however there is no evidence of ureteral calculi or hydronephrosis. Stomach/Bowel: No evidence of obstruction, inflammatory process or abnormal fluid collections. Vascular/Lymphatic: No pathologically enlarged lymph nodes. No abdominal aortic aneurysm. Reproductive:  No mass or other significant abnormality. Other:  None. Musculoskeletal:  No suspicious bone lesions identified. IMPRESSION: 13.4 cm mildly complex cystic lesion in the right hepatic lobe has significantly increased in size since 2015. Differential diagnosis includes hepatic cyst with internal hemorrhage or infection, or cystic neoplasm such as a biliary cystadenoma. No malignant characteristics identified. Percutaneous needle aspiration should be considered. No other acute findings within the abdomen or pelvis. Nonobstructing left nephrolithiasis. Electronically Signed   By: Marlaine Hind M.D.   On: 05/10/2019 12:15   US ABDOMEN LIMITED RUQ  Result Date: 05/10/2019 CLINICAL DATA:  Right upper quadrant pain. EXAM: ULTRASOUND ABDOMEN LIMITED RIGHT UPPER QUADRANT COMPARISON:  CT scan 03/18/2014. FINDINGS: Gallbladder: No gallstones or wall thickening visualized. No sonographic Murphy sign noted by  sonographer. Common bile duct: Diameter: 3 mm Liver: Large complex cystic and solid lesion identified in the central liver, measuring 11.2 x 9.4 x 11.5 cm. Patient was noted to have a 5.8 cm simple cyst in the inferior right liver on the CT scan from 5 years ago,  but this lesion does not represent that abnormality. Portal vein is patent on color Doppler imaging with normal direction of blood flow towards the liver. Other: None. IMPRESSION: Nearly 12 cm complex lesion in the central liver. Primary neoplasm or metastatic disease not excluded. Abdomen/pelvis CT with contrast material may prove helpful as a screening study. Abdominal MRI with and without contrast could be used for more definitive characterization of the liver lesion. These results will be called to the ordering clinician or representative by the Radiologist Assistant, and communication documented in the PACS or zVision Dashboard. Electronically Signed   By: Misty Stanley M.D.   On: 05/10/2019 09:14    Labs:  CBC: Recent Labs    06/07/19 0613  WBC 5.8  HGB 13.4  HCT 40.6  PLT 197    COAGS: Recent Labs    06/07/19 0613  INR 1.0    BMP: No results for input(s): NA, K, CL, CO2, GLUCOSE, BUN, CALCIUM, CREATININE, GFRNONAA, GFRAA in the last 8760 hours.  Invalid input(s): CMP  LIVER FUNCTION TESTS: No results for input(s): BILITOT, AST, ALT, ALKPHOS, PROT, ALBUMIN in the last 8760 hours.  TUMOR MARKERS: No results for input(s): AFPTM, CEA, CA199, CHROMGRNA in the last 8760 hours.  Assessment and Plan: Patient with past medical history of a fib on Xarelto presents with complaint of abdominal pain, nausea in November 2020.  Work-up has revealed a complex cystic/solid liver lesion  IR consulted for liver lesion biopsy at the request of Dr. Watt Climes. Case reviewed by Dr. Vernard Gambles who approves patient for procedure.  Patient presents today in their usual state of health.  He has been NPO and is not currently on blood thinners as  his last dose of Xarelto was Tuesday. INR 1.0  Risks and benefits was discussed with the patient and/or patient's family including, but not limited to bleeding, infection, damage to adjacent structures or low yield requiring additional tests.  All of the questions were answered and there is agreement to proceed.  Consent signed and in chart.  Thank you for this interesting consult.  I greatly enjoyed meeting George Wolf and look forward to participating in their care.  A copy of this report was sent to the requesting provider on this date.  Electronically Signed: Docia Barrier, PA 06/07/2019, 7:40 AM   I spent a total of  30 Minutes   in face to face in clinical consultation, greater than 50% of which was counseling/coordinating care for liver lesion.

## 2019-06-07 NOTE — Discharge Instructions (Signed)
Liver Biopsy, Care After These instructions give you information on caring for yourself after your procedure. Your doctor may also give you more specific instructions. Call your doctor if you have any problems or questions after your procedure. What can I expect after the procedure? After the procedure, it is common to have:  Pain and soreness where the biopsy was done.  Bruising around the area where the biopsy was done.  Sleepiness and be tired for a few days. Follow these instructions at home: Medicines  Take over-the-counter and prescription medicines only as told by your doctor.  If you were prescribed an antibiotic medicine, take it as told by your doctor. Do not stop taking the antibiotic even if you start to feel better.  Do not take medicines such as aspirin and ibuprofen. These medicines can thin your blood. Do not take these medicines unless your doctor tells you to take them.  If you are taking prescription pain medicine, take actions to prevent or treat constipation. Your doctor may recommend that you: ? Drink enough fluid to keep your pee (urine) clear or pale yellow. ? Take over-the-counter or prescription medicines. ? Eat foods that are high in fiber, such as fresh fruits and vegetables, whole grains, and beans. ? Limit foods that are high in fat and processed sugars, such as fried and sweet foods. Caring for your cut  Follow instructions from your doctor about how to take care of your cuts from surgery (incisions). Make sure you: ? Wash your hands with soap and water before you change your bandage (dressing). If you cannot use soap and water, use hand sanitizer. ? Change your bandage as told by your doctor. ? Leave stitches (sutures), skin glue, or skin tape (adhesive) strips in place. They may need to stay in place for 2 weeks or longer. If tape strips get loose and curl up, you may trim the loose edges. Do not remove tape strips completely unless your doctor says it is  okay.  Check your cuts every day for signs of infection. Check for: ? Redness, swelling, or more pain. ? Fluid or blood. ? Pus or a bad smell. ? Warmth.  Do not take baths, swim, or use a hot tub until your doctor says it is okay to do so. Activity   Rest at home for 1-2 days or as told by your doctor. ? Avoid sitting for a long time without moving. Get up to take short walks every 1-2 hours.  Return to your normal activities as told by your doctor. Ask what activities are safe for you.  Do not do these things in the first 24 hours: ? Drive. ? Use machinery. ? Take a bath or shower.  Do not lift more than 10 pounds (4.5 kg) or play contact sports for the first 2 weeks. General instructions   Do not drink alcohol in the first week after the procedure.  Have someone stay with you for at least 24 hours after the procedure.  Get your test results. Ask your doctor or the department that is doing the test: ? When will my results be ready? ? How will I get my results? ? What are my treatment options? ? What other tests do I need? ? What are my next steps?  Keep all follow-up visits as told by your doctor. This is important. Contact a doctor if:  A cut bleeds and leaves more than just a small spot of blood.  A cut is red, puffs up (  swells), or hurts more than before.  Fluid or something else comes from a cut.  A cut smells bad.  You have a fever or chills. Get help right away if:  You have swelling, bloating, or pain in your belly (abdomen).  You get dizzy or faint.  You have a rash.  You feel sick to your stomach (nauseous) or throw up (vomit).  You have trouble breathing, feel short of breath, or feel faint.  Your chest hurts.  You have problems talking or seeing.  You have trouble with your balance or moving your arms or legs. Summary  After the procedure, it is common to have pain, soreness, bruising, and tiredness.  Your doctor will tell you how to  take care of yourself at home. Change your bandage, take your medicines, and limit your activities as told by your doctor.  Call your doctor if you have symptoms of infection. Get help right away if your belly swells, your cut bleeds a lot, or you have trouble talking or breathing. This information is not intended to replace advice given to you by your health care provider. Make sure you discuss any questions you have with your health care provider. Document Revised: 05/26/2017 Document Reviewed: 05/26/2017 Elsevier Patient Education  2020 Elsevier Inc. Moderate Conscious Sedation, Adult Sedation is the use of medicines to promote relaxation and relieve discomfort and anxiety. Moderate conscious sedation is a type of sedation. Under moderate conscious sedation, you are less alert than normal, but you are still able to respond to instructions, touch, or both. Moderate conscious sedation is used during short medical and dental procedures. It is milder than deep sedation, which is a type of sedation under which you cannot be easily woken up. It is also milder than general anesthesia, which is the use of medicines to make you unconscious. Moderate conscious sedation allows you to return to your regular activities sooner. Tell a health care provider about:  Any allergies you have.  All medicines you are taking, including vitamins, herbs, eye drops, creams, and over-the-counter medicines.  Use of steroids (by mouth or creams).  Any problems you or family members have had with sedatives and anesthetic medicines.  Any blood disorders you have.  Any surgeries you have had.  Any medical conditions you have, such as sleep apnea.  Whether you are pregnant or may be pregnant.  Any use of cigarettes, alcohol, marijuana, or street drugs. What are the risks? Generally, this is a safe procedure. However, problems may occur, including:  Getting too much medicine (oversedation).  Nausea.  Allergic  reaction to medicines.  Trouble breathing. If this happens, a breathing tube may be used to help with breathing. It will be removed when you are awake and breathing on your own.  Heart trouble.  Lung trouble. What happens before the procedure? Staying hydrated Follow instructions from your health care provider about hydration, which may include:  Up to 2 hours before the procedure - you may continue to drink clear liquids, such as water, clear fruit juice, black coffee, and plain tea. Eating and drinking restrictions Follow instructions from your health care provider about eating and drinking, which may include:  8 hours before the procedure - stop eating heavy meals or foods such as meat, fried foods, or fatty foods.  6 hours before the procedure - stop eating light meals or foods, such as toast or cereal.  6 hours before the procedure - stop drinking milk or drinks that contain milk.  2   hours before the procedure - stop drinking clear liquids. Medicine Ask your health care provider about:  Changing or stopping your regular medicines. This is especially important if you are taking diabetes medicines or blood thinners.  Taking medicines such as aspirin and ibuprofen. These medicines can thin your blood. Do not take these medicines before your procedure if your health care provider instructs you not to.  Tests and exams  You will have a physical exam.  You may have blood tests done to show: ? How well your kidneys and liver are working. ? How well your blood can clot. General instructions  Plan to have someone take you home from the hospital or clinic.  If you will be going home right after the procedure, plan to have someone with you for 24 hours. What happens during the procedure?  An IV tube will be inserted into one of your veins.  Medicine to help you relax (sedative) will be given through the IV tube.  The medical or dental procedure will be performed. What  happens after the procedure?  Your blood pressure, heart rate, breathing rate, and blood oxygen level will be monitored often until the medicines you were given have worn off.  Do not drive for 24 hours. This information is not intended to replace advice given to you by your health care provider. Make sure you discuss any questions you have with your health care provider. Document Revised: 04/28/2017 Document Reviewed: 09/05/2015 Elsevier Patient Education  2020 Elsevier Inc.  

## 2019-06-10 LAB — CYTOLOGY - NON PAP

## 2019-06-12 LAB — AEROBIC/ANAEROBIC CULTURE W GRAM STAIN (SURGICAL/DEEP WOUND)
Culture: NO GROWTH
Gram Stain: NONE SEEN

## 2019-06-19 DIAGNOSIS — H25813 Combined forms of age-related cataract, bilateral: Secondary | ICD-10-CM | POA: Diagnosis not present

## 2019-06-19 DIAGNOSIS — H43393 Other vitreous opacities, bilateral: Secondary | ICD-10-CM | POA: Diagnosis not present

## 2019-06-19 DIAGNOSIS — H2181 Floppy iris syndrome: Secondary | ICD-10-CM | POA: Diagnosis not present

## 2019-06-19 DIAGNOSIS — H5703 Miosis: Secondary | ICD-10-CM | POA: Diagnosis not present

## 2019-06-19 DIAGNOSIS — H43812 Vitreous degeneration, left eye: Secondary | ICD-10-CM | POA: Diagnosis not present

## 2019-07-01 DIAGNOSIS — H25011 Cortical age-related cataract, right eye: Secondary | ICD-10-CM | POA: Diagnosis not present

## 2019-07-01 DIAGNOSIS — H25041 Posterior subcapsular polar age-related cataract, right eye: Secondary | ICD-10-CM | POA: Diagnosis not present

## 2019-07-01 DIAGNOSIS — H25811 Combined forms of age-related cataract, right eye: Secondary | ICD-10-CM | POA: Diagnosis not present

## 2019-07-01 DIAGNOSIS — H21561 Pupillary abnormality, right eye: Secondary | ICD-10-CM | POA: Diagnosis not present

## 2019-07-01 DIAGNOSIS — H2511 Age-related nuclear cataract, right eye: Secondary | ICD-10-CM | POA: Diagnosis not present

## 2019-07-11 DIAGNOSIS — H2512 Age-related nuclear cataract, left eye: Secondary | ICD-10-CM | POA: Diagnosis not present

## 2019-07-31 ENCOUNTER — Ambulatory Visit (INDEPENDENT_AMBULATORY_CARE_PROVIDER_SITE_OTHER): Payer: PPO | Admitting: *Deleted

## 2019-07-31 DIAGNOSIS — R001 Bradycardia, unspecified: Secondary | ICD-10-CM | POA: Diagnosis not present

## 2019-07-31 LAB — CUP PACEART REMOTE DEVICE CHECK
Battery Remaining Longevity: 66 mo
Battery Remaining Percentage: 70 %
Brady Statistic RA Percent Paced: 93 %
Brady Statistic RV Percent Paced: 4 %
Date Time Interrogation Session: 20210303014200
Implantable Lead Implant Date: 20130708
Implantable Lead Implant Date: 20130708
Implantable Lead Location: 753859
Implantable Lead Location: 753860
Implantable Lead Model: 4136
Implantable Lead Model: 4137
Implantable Lead Serial Number: 29021711
Implantable Lead Serial Number: 29201221
Implantable Pulse Generator Implant Date: 20130708
Lead Channel Impedance Value: 590 Ohm
Lead Channel Impedance Value: 642 Ohm
Lead Channel Pacing Threshold Amplitude: 0.5 V
Lead Channel Pacing Threshold Pulse Width: 0.4 ms
Lead Channel Setting Pacing Amplitude: 2 V
Lead Channel Setting Pacing Amplitude: 2.4 V
Lead Channel Setting Pacing Pulse Width: 0.6 ms
Lead Channel Setting Sensing Sensitivity: 2.5 mV
Pulse Gen Serial Number: 112155

## 2019-07-31 NOTE — Progress Notes (Signed)
PPM Remote  

## 2019-08-09 DIAGNOSIS — K7689 Other specified diseases of liver: Secondary | ICD-10-CM | POA: Diagnosis not present

## 2019-08-14 DIAGNOSIS — L918 Other hypertrophic disorders of the skin: Secondary | ICD-10-CM | POA: Diagnosis not present

## 2019-08-14 DIAGNOSIS — D485 Neoplasm of uncertain behavior of skin: Secondary | ICD-10-CM | POA: Diagnosis not present

## 2019-08-14 DIAGNOSIS — D2262 Melanocytic nevi of left upper limb, including shoulder: Secondary | ICD-10-CM | POA: Diagnosis not present

## 2019-08-14 DIAGNOSIS — L570 Actinic keratosis: Secondary | ICD-10-CM | POA: Diagnosis not present

## 2019-08-14 DIAGNOSIS — D225 Melanocytic nevi of trunk: Secondary | ICD-10-CM | POA: Diagnosis not present

## 2019-08-14 DIAGNOSIS — L821 Other seborrheic keratosis: Secondary | ICD-10-CM | POA: Diagnosis not present

## 2019-08-14 DIAGNOSIS — D1801 Hemangioma of skin and subcutaneous tissue: Secondary | ICD-10-CM | POA: Diagnosis not present

## 2019-10-07 ENCOUNTER — Other Ambulatory Visit: Payer: Self-pay

## 2019-10-07 DIAGNOSIS — I480 Paroxysmal atrial fibrillation: Secondary | ICD-10-CM

## 2019-10-07 NOTE — Telephone Encounter (Signed)
CALLED AND LMOMED THE PT OVERDUE LABS 2 YRS. LABD ORDER PLACED.

## 2019-10-07 NOTE — Telephone Encounter (Signed)
Pt returned call and stated that he will get them done by the end of the week

## 2019-10-10 MED ORDER — RIVAROXABAN 20 MG PO TABS: ORAL_TABLET | ORAL | 0 refills | Status: DC

## 2019-10-30 ENCOUNTER — Ambulatory Visit (INDEPENDENT_AMBULATORY_CARE_PROVIDER_SITE_OTHER): Payer: PPO | Admitting: *Deleted

## 2019-10-30 DIAGNOSIS — I495 Sick sinus syndrome: Secondary | ICD-10-CM

## 2019-10-30 LAB — CUP PACEART REMOTE DEVICE CHECK
Battery Remaining Longevity: 60 mo
Battery Remaining Percentage: 64 %
Brady Statistic RA Percent Paced: 93 %
Brady Statistic RV Percent Paced: 5 %
Date Time Interrogation Session: 20210602014200
Implantable Lead Implant Date: 20130708
Implantable Lead Implant Date: 20130708
Implantable Lead Location: 753859
Implantable Lead Location: 753860
Implantable Lead Model: 4136
Implantable Lead Model: 4137
Implantable Lead Serial Number: 29021711
Implantable Lead Serial Number: 29201221
Implantable Pulse Generator Implant Date: 20130708
Lead Channel Impedance Value: 568 Ohm
Lead Channel Impedance Value: 615 Ohm
Lead Channel Pacing Threshold Amplitude: 0.5 V
Lead Channel Pacing Threshold Pulse Width: 0.4 ms
Lead Channel Setting Pacing Amplitude: 2 V
Lead Channel Setting Pacing Amplitude: 2.4 V
Lead Channel Setting Pacing Pulse Width: 0.6 ms
Lead Channel Setting Sensing Sensitivity: 2.5 mV
Pulse Gen Serial Number: 112155

## 2019-11-01 NOTE — Progress Notes (Signed)
Remote pacemaker transmission.   

## 2019-11-13 DIAGNOSIS — R7301 Impaired fasting glucose: Secondary | ICD-10-CM | POA: Diagnosis not present

## 2019-11-13 DIAGNOSIS — E782 Mixed hyperlipidemia: Secondary | ICD-10-CM | POA: Diagnosis not present

## 2019-11-20 DIAGNOSIS — I10 Essential (primary) hypertension: Secondary | ICD-10-CM | POA: Diagnosis not present

## 2019-11-20 DIAGNOSIS — K7689 Other specified diseases of liver: Secondary | ICD-10-CM | POA: Diagnosis not present

## 2019-11-20 DIAGNOSIS — E782 Mixed hyperlipidemia: Secondary | ICD-10-CM | POA: Diagnosis not present

## 2019-11-20 DIAGNOSIS — R7303 Prediabetes: Secondary | ICD-10-CM | POA: Diagnosis not present

## 2019-11-20 DIAGNOSIS — R748 Abnormal levels of other serum enzymes: Secondary | ICD-10-CM | POA: Diagnosis not present

## 2019-11-20 DIAGNOSIS — I480 Paroxysmal atrial fibrillation: Secondary | ICD-10-CM | POA: Diagnosis not present

## 2019-12-13 ENCOUNTER — Ambulatory Visit (INDEPENDENT_AMBULATORY_CARE_PROVIDER_SITE_OTHER): Payer: PPO | Admitting: Cardiovascular Disease

## 2019-12-13 ENCOUNTER — Other Ambulatory Visit: Payer: Self-pay

## 2019-12-13 ENCOUNTER — Encounter: Payer: Self-pay | Admitting: Cardiovascular Disease

## 2019-12-13 VITALS — BP 127/74 | HR 67 | Ht 72.0 in | Wt 230.8 lb

## 2019-12-13 DIAGNOSIS — I480 Paroxysmal atrial fibrillation: Secondary | ICD-10-CM

## 2019-12-13 DIAGNOSIS — Z7901 Long term (current) use of anticoagulants: Secondary | ICD-10-CM | POA: Diagnosis not present

## 2019-12-13 DIAGNOSIS — R160 Hepatomegaly, not elsewhere classified: Secondary | ICD-10-CM

## 2019-12-13 DIAGNOSIS — I472 Ventricular tachycardia: Secondary | ICD-10-CM | POA: Diagnosis not present

## 2019-12-13 DIAGNOSIS — I4729 Other ventricular tachycardia: Secondary | ICD-10-CM

## 2019-12-13 DIAGNOSIS — Z950 Presence of cardiac pacemaker: Secondary | ICD-10-CM | POA: Diagnosis not present

## 2019-12-13 DIAGNOSIS — I495 Sick sinus syndrome: Secondary | ICD-10-CM

## 2019-12-13 DIAGNOSIS — I10 Essential (primary) hypertension: Secondary | ICD-10-CM

## 2019-12-13 NOTE — Patient Instructions (Signed)

## 2019-12-15 ENCOUNTER — Encounter: Payer: Self-pay | Admitting: Cardiovascular Disease

## 2019-12-15 NOTE — Progress Notes (Signed)
Cardiology Office Note    Date:  12/15/2019   ID:  Cru, George Wolf, MRN [id number]  PCP:  Merrilee Seashore, MD  Cardiologist:   Sanda Klein, MD   Chief Complaint  Patient presents with  . Irregular Heart Beat    History of Present Illness:  George Wolf is a 74 y.o. male with HTN, paroxysmal atrial fibrillation and symptomatic sinus node dysfunction who received a dual-chamber permanent pacemaker in 2013 and returns for follow-up. He does not have significant structural heart disease.   The patient specifically denies any chest pain at rest exertion, dyspnea at rest or with exertion, orthopnea, paroxysmal nocturnal dyspnea, syncope, palpitations, focal neurological deficits, intermittent claudication, lower extremity edema, unexplained weight gain, cough, hemoptysis or wheezing.  He saw Dr. Barron Schmid at [facility] and there is convincing evidence that his liver abnormality is benign.  He underwent cyst aspiration on January 2021 with no evidence of malignancy or of infection.  Interrogation of his pacemaker shows normal device function. Battery status is good, with estimated longevity of 5 years.  Atrial fibrillation has not been recorded in almost 4 years now.  As before, he has 91% atrial paced and only 5% ventricular paced beats.  Underlying rhythm today with sinus bradycardia at about 50 bpm.  Heart rate histogram distribution is appropriate.   Past Medical History:  Diagnosis Date  . AF (paroxysmal atrial fibrillation) ([place])   . Arthritis   . Cancer [facility])    SKIN CANCER  . Depression   . Hypertension   . Pacemaker 11/2011   [employer] for [employer]  . Sinus node dysfunction [facility])    [employer] pacemaker 12/05/11    Past Surgical History:  Procedure Laterality Date  . FINGER SURGERY     LEFT THUMB   11/2016  . INSERTION OF MESH N/A 05/31/2017   Procedure: INSERTION OF MESH;  Surgeon: Donnie Mesa, MD;  Location: [place];  Service:  General;  Laterality: N/A;  . PERMANENT PACEMAKER INSERTION  12/05/2011   [employer]  . PERMANENT PACEMAKER INSERTION N/A 12/05/2011   Procedure: PERMANENT PACEMAKER INSERTION;  Surgeon: Sanda Klein, MD;  Location: [place] CATH LAB;  Service: Cardiovascular;  Laterality: N/A;  . REPLACEMENT TOTAL KNEE  03/23/2009   right  . UMBILICAL HERNIA REPAIR N/A 05/31/2017   Procedure: HERNIA REPAIR UMBILICAL ERAS PATHWAY;  Surgeon: Donnie Mesa, MD;  Location: [place];  Service: General;  Laterality: N/A;  LMA ONLY  . US ECHOCARDIOGRAPHY  11/01/2011   mildly dilated LA    Current Medications: Outpatient Medications Prior to Visit  Medication Sig Dispense Refill  . allopurinol (ZYLOPRIM) 100 MG tablet Take 100 mg by mouth daily.  9  . atorvastatin (LIPITOR) 20 MG tablet Take 20 mg by mouth daily at 6 PM. Take 1 tab daily in the evening  2  . benazepril (LOTENSIN) 20 MG tablet Take 20 mg by mouth daily.    Marland Kitchen buPROPion (WELLBUTRIN SR) 150 MG 12 hr tablet Take 150 mg by mouth 2 (two) times daily.    . clindamycin (CLEOCIN) 300 MG capsule Take 600 mg by mouth See admin instructions. For dental prodcures prn    . clonazePAM (KLONOPIN) 0.5 MG tablet Take 0.5 mg by mouth daily as needed for anxiety.     . fexofenadine (ALLEGRA) 180 MG tablet Take 180 mg by mouth daily as needed for allergies.     . fluticasone (FLONASE) 50 MCG/ACT nasal spray Place 1 spray  into both nostrils as needed.     . hydrochlorothiazide (HYDRODIURIL) 25 MG tablet Take 25 mg by mouth daily.    Marland Kitchen ibuprofen (ADVIL,MOTRIN) 200 MG tablet Take 600-800 mg by mouth every 8 (eight) hours as needed for moderate pain (knee).    . Magnesium Oxide 500 MG CAPS Take 500 mg by mouth 2 (two) times daily.    . Multiple Vitamin (MULTIVITAMIN WITH MINERALS) TABS Take 1 tablet by mouth daily.    . rivaroxaban (XARELTO) 20 MG TABS tablet TAKE 1 TABLET(20 MG) BY MOUTH DAILY WITH DINNER 30 tablet 0  . sertraline (ZOLOFT) 100 MG tablet Take 100 mg by mouth  daily.      No facility-administered medications prior to visit.     Allergies:   Penicillins   Social History   Socioeconomic History  . Marital status: Married    Spouse name: Not on file  . Number of children: Not on file  . Years of education: Not on file  . Highest education level: Not on file  Occupational History  . Not on file  Tobacco Use  . Smoking status: Never Smoker  . Smokeless tobacco: Never Used  Vaping Use  . Vaping Use: Never used  Substance and Sexual Activity  . Alcohol use: Yes    Comment: OCC WINE   . Drug use: No  . Sexual activity: Yes    Partners: Female  Other Topics Concern  . Not on file  Social History Narrative  . Not on file   Social Determinants of Health   Financial Resource Strain:   . Difficulty of Paying Living Expenses:   Food Insecurity:   . Worried About Charity fundraiser in the Last Year:   . Arboriculturist in the Last Year:   Transportation Needs:   . Film/video editor (Medical):   Marland Kitchen Lack of Transportation (Non-Medical):   Physical Activity:   . Days of Exercise per Week:   . Minutes of Exercise per Session:   Stress:   . Feeling of Stress :   Social Connections:   . Frequency of Communication with Friends and Family:   . Frequency of Social Gatherings with Friends and Family:   . Attends Religious Services:   . Active Member of Clubs or Organizations:   . Attends Archivist Meetings:   Marland Kitchen Marital Status:      Family History:  The patient's family history includes Diabetes in his father.   ROS:   Please see the history of present illness.    ROS All other systems are reviewed and are negative.   PHYSICAL EXAM:   VS:  BP 127/74   Pulse 67   Ht 6' (1.829 m)   Wt 230 lb 12.8 oz (104.7 kg)   SpO2 97%   BMI 31.30 kg/m      General: Alert, oriented x3, no distress, mildly obese, healthy left subclavian pacemaker site Head: no evidence of trauma, PERRL, EOMI, no exophtalmos or lid lag, no  myxedema, no xanthelasma; normal ears, nose and oropharynx Neck: normal jugular venous pulsations and no hepatojugular reflux; brisk carotid pulses without delay and no carotid bruits Chest: clear to auscultation, no signs of consolidation by percussion or palpation, normal fremitus, symmetrical and full respiratory excursions Cardiovascular: normal position and quality of the apical impulse, regular rhythm, normal first and second heart sounds, no murmurs, rubs or gallops Abdomen: no tenderness or distention, no masses by palpation, no abnormal pulsatility or  arterial bruits, normal bowel sounds, no hepatosplenomegaly Extremities: no clubbing, cyanosis or edema; 2+ radial, ulnar and brachial pulses bilaterally; 2+ right femoral, posterior tibial and dorsalis pedis pulses; 2+ left femoral, posterior tibial and dorsalis pedis pulses; no subclavian or femoral bruits Neurological: grossly nonfocal Psych: Normal mood and affect   Wt Readings from Last 3 Encounters:  12/13/19 230 lb 12.8 oz (104.7 kg)  06/07/19 235 lb (106.6 kg)  05/13/19 238 lb 6.4 oz (108.1 kg)      Studies/Labs Reviewed:   EKG:  EKG is ordered today.  Shows atrial paced, ventricular sensed rhythm, otherwise normal tracing.  QTc 437 ms.  ASSESSMENT:    1. Paroxysmal atrial fibrillation (HCC)   2. Long term current use of anticoagulant   3. SSS (sick sinus syndrome) ([place])   4. Pacemaker, dual-chamber [employer], sinus node dysfunction, implanted 12/05/11   5. NSVT (nonsustained ventricular tachycardia) (HCC)   6. Essential hypertension   7. Liver mass      PLAN:  In order of problems listed above:  1. AFib: This has been extremely frequent.  If there is any need for invasive procedures or suspicion of bleeding complications, interrupting his anticoagulant should be associated with a very low risk of embolic complications.  CHA2DS2-VASc 3 (age 68, hypertension). 2. Anticoagulation: No significant bleeding  complications on Xarelto. 3. SSS: We adjusted his rate response last time and he seems to have fewer complaints of fatigue "warming up" 4. PPM: Normal device function.  He is not pacemaker dependent.  Remote downloads every 3 months. 5. NSVT: Rare and asymptomatic events recorded by his device in the past, none in the last 12 months 6. HTN: Well-controlled 7. Liver mass: Appears to be a benign abnormality, probably a hepatic cyst.    Medication Adjustments/Labs and Tests Ordered: Current medicines are reviewed at length with the patient today.  Concerns regarding medicines are outlined above.  Medication changes, Labs and Tests ordered today are listed in the Patient Instructions below. Patient Instructions  Medication Instructions:  No changes *If you need a refill on your cardiac medications before your next appointment, please call your pharmacy*   Lab Work: None ordered If you have labs (blood work) drawn today and your tests are completely normal, you will receive your results only by: Marland Kitchen MyChart Message (if you have MyChart) OR . A paper copy in the mail If you have any lab test that is abnormal or we need to change your treatment, we will call you to review the results.   Testing/Procedures: None ordered   Follow-Up: At [facility], you and your health needs are our priority.  As part of our continuing mission to provide you with exceptional heart care, we have created designated Provider Care Teams.  These Care Teams include your primary Cardiologist (physician) and Advanced Practice Providers (APPs -  Physician Assistants and Nurse Practitioners) who all work together to provide you with the care you need, when you need it.  We recommend signing up for the patient portal called "MyChart".  Sign up information is provided on this After Visit Summary.  MyChart is used to connect with patients for Virtual Visits (Telemedicine).  Patients are able to view lab/test results,  encounter notes, upcoming appointments, etc.  Non-urgent messages can be sent to your provider as well.   To learn more about what you can do with MyChart, go to [email or web address].    Your next appointment:   12 month(s)  The format for your  next appointment:   In Person  Provider:   Sanda Klein, MD        Signed, Sanda Klein, MD  12/15/2019 3:11 PM    [facility] [address], [place],   [address] Phone: [phone]; Fax: [phone]

## 2020-01-09 DIAGNOSIS — M1712 Unilateral primary osteoarthritis, left knee: Secondary | ICD-10-CM | POA: Diagnosis not present

## 2020-01-13 ENCOUNTER — Telehealth: Payer: Self-pay

## 2020-01-13 NOTE — Telephone Encounter (Signed)
   [facility] Pre-operative Risk Assessment    HEARTCARE STAFF: - Please ensure there is not already an duplicate clearance open for this procedure. - Under Visit Info/Reason for Call, type in Other and utilize the format Clearance MM/DD/YY or Clearance TBD. Do not use dashes or single digits. - If request is for dental extraction, please clarify the # of teeth to be extracted.  Request for surgical clearance:  1. What type of surgery is being performed? Left Total Knee Arthroplasty   2. When is this surgery scheduled? 03/16/2020   3. What type of clearance is required (medical clearance vs. Pharmacy clearance to hold med vs. Both)? Both   4. Are there any medications that need to be held prior to surgery and how long? Xarelto   5. Practice name and name of physician performing surgery? [facility], Dr. Pilar Plate Aluisio   6. What is the office phone number? [phone]   7.   What is the office fax number? [phone]  8.   Anesthesia type (None, local, MAC, general) ? Choice   Jacqulynn Cadet 01/13/2020, 5:34 PM  _________________________________________________________________   (provider comments below)

## 2020-01-14 NOTE — Telephone Encounter (Signed)
Pt just saw Dr. Sallyanne Kuster 11/2019. Surgery scheduled 03/16/20. Preop APP should call late Sept to get clearance. Richardson Dopp, PA-C    01/14/2020 4:20 PM

## 2020-01-27 NOTE — Telephone Encounter (Signed)
   Primary Cardiologist: Sanda Klein, MD  Chart reviewed as part of pre-operative protocol coverage. Given past medical history and time since last visit as well as conversation below, based on ACC/AHA guidelines, George Wolf would be at acceptable risk for the planned procedure without further cardiovascular testing.  Per pharmD, per office protocol, patient can hold XARELTO for 3 days prior to procedure.    For orthopedic procedures please be sure to resume therapeutic (not prophylactic) dosing.   The patient was advised that if he develops new symptoms prior to surgery to contact our office to arrange for a follow-up visit, and he verbalized understanding.  I will route this recommendation to the requesting party via Epic fax function and remove from pre-op pool. Will also route to device clinic per d/w Dr. Sallyanne Kuster to ensure they do not have any needs to provide with regard to patient's implanted pacemaker. Otherwise please call with questions.  Charlie Pitter, PA-C 01/27/2020, 3:58 PM

## 2020-01-27 NOTE — Telephone Encounter (Signed)
   Primary Cardiologist: Sanda Klein, MD  Chart reviewed as part of pre-operative protocol coverage. Patient was contacted 01/27/2020 in reference to pre-operative risk assessment for pending surgery as outlined below.  LARREN COPES was last seen on 11/2019 by Dr. Sallyanne Kuster, He has history of PAF, HTN, sinus node dysfunction s/p PPM 2013, depression, prior NSVT. LVEF 50-55 by echo 02/2019 (? Mention of TIA in indication). Per last OV, no recent afib or NSVT. Not pacemaker dependent. Since we are now within 2 month window of procedure, I did call patient to review how he is doing to clear via protocol but got VM. LMTCB. Will also need to route to device clinic to ensure their form is filled out if necessary given device.  Will route to pharm for input on anticoagulation. He has no recent labs in [place] but was able to review from [facility]  04/2019 [facility] labs showing BUN 25, Cr 1.040 and 05/2019 Hgb 13.4.    Charlie Pitter, PA-C 01/27/2020, 1:05 PM

## 2020-01-27 NOTE — Telephone Encounter (Signed)
Spoke with patient who affirms he's been doing great without any interim issues or cardiac symptoms. He denies formal hx of TIA but thinks that diagnosis code on the echo from 02/2019 may have come from when he went to see his PCP last fall and his wife mentioned that one time after a long large family gathering he was speaking slowly but the patient reports this was due to a lifelong stutter and did not see it as an issue. Therefore the true dx remains in question. Assuming all precautions that it was a TIA his RCRI is still 0.9% indicating low risk of CV complications. Therefore, based on ACC/AHA guidelines, George Wolf would be at acceptable risk for the planned procedure without further cardiovascular testing. We did discuss that if he should have any interim symptoms between now and date of surgery he should contact our office. Await pharm input on anticoag then will finalize recommendations and route over to device clinic.   PA-C

## 2020-01-27 NOTE — Telephone Encounter (Signed)
Patient with diagnosis of ATRIAL FIBRILLATION on [place] for anticoagulation.    Procedure: LEFT TKA Date of procedure: 03/16/20  CHADS2-VASc score of  3 (HTN, AGE x 2)  CrCl = 89 ml/min  Per office protocol, patient can hold XARELTO for 3 days prior to procedure.    For orthopedic procedures please be sure to resume therapeutic (not prophylactic) dosing.

## 2020-01-27 NOTE — Telephone Encounter (Signed)
Patient returning call.

## 2020-01-28 NOTE — Telephone Encounter (Signed)
Per Dr. Sallyanne Kuster, no precautions needed for pacemaker with the knee surgery

## 2020-01-29 ENCOUNTER — Ambulatory Visit (INDEPENDENT_AMBULATORY_CARE_PROVIDER_SITE_OTHER): Payer: PPO | Admitting: *Deleted

## 2020-01-29 DIAGNOSIS — I495 Sick sinus syndrome: Secondary | ICD-10-CM | POA: Diagnosis not present

## 2020-01-29 LAB — CUP PACEART REMOTE DEVICE CHECK
Battery Remaining Longevity: 60 mo
Battery Remaining Percentage: 63 %
Brady Statistic RA Percent Paced: 96 %
Brady Statistic RV Percent Paced: 6 %
Date Time Interrogation Session: 20210901014100
Implantable Lead Implant Date: 20130708
Implantable Lead Implant Date: 20130708
Implantable Lead Location: 753859
Implantable Lead Location: 753860
Implantable Lead Model: 4136
Implantable Lead Model: 4137
Implantable Lead Serial Number: 29021711
Implantable Lead Serial Number: 29201221
Implantable Pulse Generator Implant Date: 20130708
Lead Channel Impedance Value: 562 Ohm
Lead Channel Impedance Value: 692 Ohm
Lead Channel Pacing Threshold Amplitude: 0.5 V
Lead Channel Pacing Threshold Pulse Width: 0.4 ms
Lead Channel Setting Pacing Amplitude: 2 V
Lead Channel Setting Pacing Amplitude: 2.4 V
Lead Channel Setting Pacing Pulse Width: 0.6 ms
Lead Channel Setting Sensing Sensitivity: 2.5 mV
Pulse Gen Serial Number: 112155

## 2020-01-30 DIAGNOSIS — Z01818 Encounter for other preprocedural examination: Secondary | ICD-10-CM | POA: Diagnosis not present

## 2020-01-30 NOTE — Progress Notes (Signed)
Remote pacemaker transmission.   

## 2020-02-05 DIAGNOSIS — I480 Paroxysmal atrial fibrillation: Secondary | ICD-10-CM | POA: Diagnosis not present

## 2020-02-05 DIAGNOSIS — I10 Essential (primary) hypertension: Secondary | ICD-10-CM | POA: Diagnosis not present

## 2020-02-05 DIAGNOSIS — E782 Mixed hyperlipidemia: Secondary | ICD-10-CM | POA: Diagnosis not present

## 2020-02-05 DIAGNOSIS — R7303 Prediabetes: Secondary | ICD-10-CM | POA: Diagnosis not present

## 2020-02-05 DIAGNOSIS — Z01818 Encounter for other preprocedural examination: Secondary | ICD-10-CM | POA: Diagnosis not present

## 2020-02-26 DIAGNOSIS — N281 Cyst of kidney, acquired: Secondary | ICD-10-CM | POA: Diagnosis not present

## 2020-02-26 DIAGNOSIS — N200 Calculus of kidney: Secondary | ICD-10-CM | POA: Diagnosis not present

## 2020-02-26 DIAGNOSIS — K5790 Diverticulosis of intestine, part unspecified, without perforation or abscess without bleeding: Secondary | ICD-10-CM | POA: Diagnosis not present

## 2020-02-26 DIAGNOSIS — K7689 Other specified diseases of liver: Secondary | ICD-10-CM | POA: Diagnosis not present

## 2020-03-09 ENCOUNTER — Encounter: Payer: Self-pay | Admitting: Cardiovascular Disease

## 2020-03-09 NOTE — Patient Instructions (Addendum)
DUE TO COVID-19 ONLY ONE VISITOR IS ALLOWED TO COME WITH YOU AND STAY IN THE WAITING ROOM ONLY DURING PRE OP AND PROCEDURE DAY OF SURGERY. THE 1 VISITOR  MAY VISIT WITH YOU AFTER SURGERY IN YOUR PRIVATE ROOM DURING VISITING HOURS ONLY!  YOU NEED TO HAVE A COVID 19 TEST ON_10/14______ @___0850____ , THIS TEST MUST BE DONE BEFORE SURGERY,  COVID TESTING SITE [address] [place] [address], IT IS ON THE RIGHT GOING OUT [address] APPROXIMATELY  2 MINUTES PAST [employer] ON THE RIGHT. ONCE YOUR COVID TEST IS COMPLETED,  PLEASE BEGIN THE QUARANTINE INSTRUCTIONS AS OUTLINED IN YOUR HANDOUT.                George Wolf   Your procedure is scheduled on: 03/16/20   Report to [facility] Main  Entrance   Report to admitting at  8:00 AM     Call this number if you have problems the morning of surgery [place], NO CHEWING GUM [place].  No food after midnight.    You may have clear liquid until 7:30 AM.    At 7:30 AM drink pre surgery drink  . Nothing by mouth after 7:30 AM.    Take these medicines the morning of surgery with A SIP OF WATER: Wellbutrin, Zoloft, Pantoprazole, Allopurinol,               Flonase, Allegra, and Clonazepam if needed                                 You may not have any metal on your body including              piercings  Do not wear jewelry, lotions, powders or deodorant                        Men may shave face and neck.   Do not bring valuables to the hospital. [place].  Contacts, dentures or bridgework may not be worn into surgery.       Patients discharged the day of surgery will not be allowed to drive home  . IF YOU ARE HAVING SURGERY AND GOING HOME THE SAME DAY, YOU MUST HAVE AN ADULT TO DRIVE YOU HOME AND BE WITH YOU FOR 24 HOURS.  YOU MAY GO HOME BY TAXI OR UBER OR ORTHERWISE, BUT AN  ADULT MUST ACCOMPANY YOU HOME AND STAY WITH YOU FOR 24 HOURS.  Name and phone number of your driver:  Special Instructions: N/A              Please read over the following fact sheets you were given: _____________________________________________________________________             [facility] - Preparing for Surgery Before surgery, you can play an important role.   Because skin is not sterile, your skin needs to be as free of germs as possible.  You can reduce the number of germs on your skin by washing with CHG (chlorahexidine gluconate) soap before surgery.   CHG is an antiseptic cleaner which kills germs and bonds with the skin to continue killing germs even after washing. Please DO NOT use  if you have an allergy to CHG or antibacterial soaps.   If your skin becomes reddened/irritated stop using the CHG and inform your nurse when you arrive at Short Stay.   You may shave your face/neck.  Please follow these instructions carefully:  1.  Shower with CHG Soap the night before surgery and the  morning of Surgery.  2.  If you choose to wash your hair, wash your hair first as usual with your  normal  shampoo.  3.  After you shampoo, rinse your hair and body thoroughly to remove the  shampoo.                                        4.  Use CHG as you would any other liquid soap.  You can apply chg directly  to the skin and wash                       Gently with a scrungie or clean washcloth.  5.  Apply the CHG Soap to your body ONLY FROM THE NECK DOWN.   Do not use on face/ open                           Wound or open sores. Avoid contact with eyes, ears mouth and genitals (private parts).                       Wash face,  Genitals (private parts) with your normal soap.             6.  Wash thoroughly, paying special attention to the area where your surgery  will be performed.  7.  Thoroughly rinse your body with warm water from the neck down.  8.  DO NOT shower/wash with your normal soap  after using and rinsing off  the CHG Soap.             9.  Pat yourself dry with a clean towel.            10.  Wear clean pajamas.            11.  Place clean sheets on your bed the night of your first shower and do not  sleep with pets. Day of Surgery : Do not apply any lotions/deodorants the morning of surgery.  Please wear clean clothes to the hospital/surgery center.  FAILURE TO FOLLOW THESE INSTRUCTIONS MAY RESULT IN THE CANCELLATION OF YOUR SURGERY PATIENT SIGNATURE_________________________________  NURSE SIGNATURE__________________________________  ________________________________________________________________________   Adam Phenix  An incentive spirometer is a tool that can help keep your lungs clear and active. This tool measures how well you are filling your lungs with each breath. Taking long deep breaths may help reverse or decrease the chance of developing breathing (pulmonary) problems (especially infection) following:  A long period of time when you are unable to move or be active. BEFORE THE PROCEDURE   If the spirometer includes an indicator to show your best effort, your nurse or respiratory therapist will set it to a desired goal.  If possible, sit up straight or lean slightly forward. Try not to slouch.  Hold the incentive spirometer in an upright position. INSTRUCTIONS FOR USE  1. Sit on the edge of your bed if possible, or sit up as far as you  can in bed or on a chair. 2. Hold the incentive spirometer in an upright position. 3. Breathe out normally. 4. Place the mouthpiece in your mouth and seal your lips tightly around it. 5. Breathe in slowly and as deeply as possible, raising the piston or the ball toward the top of the column. 6. Hold your breath for 3-5 seconds or for as long as possible. Allow the piston or ball to fall to the bottom of the column. 7. Remove the mouthpiece from your mouth and breathe out normally. 8. Rest for a few seconds  and repeat Steps 1 through 7 at least 10 times every 1-2 hours when you are awake. Take your time and take a few normal breaths between deep breaths. 9. The spirometer may include an indicator to show your best effort. Use the indicator as a goal to work toward during each repetition. 10. After each set of 10 deep breaths, practice coughing to be sure your lungs are clear. If you have an incision (the cut made at the time of surgery), support your incision when coughing by placing a pillow or rolled up towels firmly against it. Once you are able to get out of bed, walk around indoors and cough well. You may stop using the incentive spirometer when instructed by your caregiver.  RISKS AND COMPLICATIONS  Take your time so you do not get dizzy or light-headed.  If you are in pain, you may need to take or ask for pain medication before doing incentive spirometry. It is harder to take a deep breath if you are having pain. AFTER USE  Rest and breathe slowly and easily.  It can be helpful to keep track of a log of your progress. Your caregiver can provide you with a simple table to help with this. If you are using the spirometer at home, follow these instructions: [place] IF:   You are having difficultly using the spirometer.  You have trouble using the spirometer as often as instructed.  Your pain medication is not giving enough relief while using the spirometer.  You develop fever of 100.5 F (38.1 C) or higher. SEEK IMMEDIATE MEDICAL CARE IF:   You cough up bloody sputum that had not been present before.  You develop fever of 102 F (38.9 C) or greater.  You develop worsening pain at or near the incision site. MAKE SURE YOU:   Understand these instructions.  Will watch your condition.  Will get help right away if you are not doing well or get worse. Document Released: 09/26/2006 Document Revised: 08/08/2011 Document Reviewed: 11/27/2006 [facility] Patient Information  2014 [place], [place].   ________________________________________________________________________

## 2020-03-09 NOTE — Progress Notes (Signed)
PERIOPERATIVE PRESCRIPTION FOR IMPLANTED CARDIAC DEVICE PROGRAMMING  Patient Information: Name:  George Wolf  DOB:  11-29-1945  MRN:  [id number]    Planned Procedure: Left total knee arthroplasty  Surgeon: Aluisio  Date of Procedure: 03/16/20  Cautery will be used.  Position during surgery:    Please send documentation back to:  [facility] (Fax # [phone])   Johny Drilling, RN  03/09/2020 10:30 AM   Device Information:  Clinic EP Physician:  Dr. Dani Gobble Croitoru  Device Type:  Pacemaker Manufacturer and Phone #:  [employer]: [phone] Pacemaker Dependent?:  No. Date of Last Device Check:  01/29/20 remote  12/13/19 In-office Normal Device Function?:  Yes.    Electrophysiologist's Recommendations:   Have magnet available.  Provide continuous ECG monitoring when magnet is used or reprogramming is to be performed.   Procedure should not interfere with device function.  No device programming or magnet placement needed.  Per Device Clinic Standing Orders, [name], RN  11:01 AM 03/09/2020

## 2020-03-11 ENCOUNTER — Encounter (HOSPITAL_COMMUNITY)
Admission: RE | Admit: 2020-03-11 | Discharge: 2020-03-11 | Disposition: A | Discharge status: Home / Self Care | Payer: PPO | Source: Ambulatory Visit | Attending: Orthopedic Surgery | Admitting: Orthopedic Surgery

## 2020-03-11 ENCOUNTER — Encounter (HOSPITAL_COMMUNITY): Payer: Self-pay

## 2020-03-11 ENCOUNTER — Other Ambulatory Visit: Payer: Self-pay

## 2020-03-11 DIAGNOSIS — Z01812 Encounter for preprocedural laboratory examination: Secondary | ICD-10-CM | POA: Diagnosis not present

## 2020-03-11 LAB — COMPREHENSIVE METABOLIC PANEL
ALT: 19 U/L (ref 0–44)
AST: 24 U/L (ref 15–41)
Albumin: 4.2 g/dL (ref 3.5–5.0)
Alkaline Phosphatase: 52 U/L (ref 38–126)
Anion gap: 11 (ref 5–15)
BUN: 35 mg/dL — ABNORMAL HIGH (ref 8–23)
CO2: 27 mmol/L (ref 22–32)
Calcium: 8.9 mg/dL (ref 8.9–10.3)
Chloride: 102 mmol/L (ref 98–111)
Creatinine, Ser: 0.86 mg/dL (ref 0.61–1.24)
GFR, Estimated: >60 mL/min (ref >60)
Glucose, Bld: 115 mg/dL — ABNORMAL HIGH (ref 70–99)
Potassium: 3.8 mmol/L (ref 3.5–5.1)
Sodium: 140 mmol/L (ref 135–145)
Total Bilirubin: 0.7 mg/dL (ref 0.3–1.2)
Total Protein: 7.3 g/dL (ref 6.5–8.1)

## 2020-03-11 LAB — CBC
HCT: 39.3 % (ref 39.0–52.0)
Hemoglobin: 13.1 g/dL (ref 13.0–17.0)
MCH: 32 pg (ref 26.0–34.0)
MCHC: 33.3 g/dL (ref 30.0–36.0)
MCV: 95.9 fL (ref 80.0–100.0)
Platelets: 180 10*3/uL (ref 150–400)
RBC: 4.1 MIL/uL — ABNORMAL LOW (ref 4.22–5.81)
RDW: 13.3 % (ref 11.5–15.5)
WBC: 6.3 10*3/uL (ref 4.0–10.5)
nRBC: 0 % (ref 0.0–0.2)

## 2020-03-11 LAB — PROTIME-INR
INR: 1.1 (ref 0.8–1.2)
Prothrombin Time: 13.6 seconds (ref 11.4–15.2)

## 2020-03-11 LAB — SURGICAL PCR SCREEN
MRSA, PCR: NEGATIVE
Staphylococcus aureus: NEGATIVE

## 2020-03-11 LAB — APTT: aPTT: 35 seconds (ref 24–36)

## 2020-03-11 NOTE — Progress Notes (Signed)
Dr [facility] surgical clearance per chart 02/05/2020 Dr Sallyanne Kuster Cardiologist Pacemaker Device Orders per chart  01/29/2020 (last check)  Surgical Clearance per epic 01/13/2020  EKG (epic) 12/16/2019 ECHO (epic) 03/21/2019  Pt on Xarelto 20 mgs daily. Pt verbalized to stop taking 3 days prior to surgery date as stated per Dr Croitoru/pharmD/office protocol 01/27/2020.  Patient verbalized understanding of instructions that were given to them at the PAT appointment. Patient was also instructed that they will need to review over the PAT instructions again at home before surgery.

## 2020-03-12 ENCOUNTER — Other Ambulatory Visit (HOSPITAL_COMMUNITY)
Admission: RE | Admit: 2020-03-12 | Discharge: 2020-03-12 | Disposition: A | Discharge status: Home / Self Care | Payer: PPO | Source: Ambulatory Visit | Attending: Orthopedic Surgery | Admitting: Orthopedic Surgery

## 2020-03-12 DIAGNOSIS — Z20822 Contact with and (suspected) exposure to covid-19: Secondary | ICD-10-CM | POA: Insufficient documentation

## 2020-03-12 DIAGNOSIS — Z01818 Encounter for other preprocedural examination: Secondary | ICD-10-CM | POA: Insufficient documentation

## 2020-03-12 LAB — SARS CORONAVIRUS 2 (TAT 6-24 HRS): SARS Coronavirus 2: NEGATIVE

## 2020-03-15 MED ORDER — BUPIVACAINE LIPOSOME 1.3 % IJ SUSP
20.0000 mL | Freq: Once | INTRAMUSCULAR | Status: DC
  Filled 2020-03-15: qty 20

## 2020-03-15 NOTE — H&P (Signed)
TOTAL KNEE ADMISSION H&P  Patient is being admitted for left total knee arthroplasty.  Subjective:  Chief Complaint:left knee pain.  HPI: George Wolf, 74 y.o. male, has a history of pain and functional disability in the left knee due to arthritis and has failed non-surgical conservative treatments for greater than 12 weeks to includecorticosteriod injections and activity modification.  Onset of symptoms was gradual, starting 1 years ago with gradually worsening course since that time. The patient noted no past surgery on the left knee(s).  Patient currently rates pain in the left knee(s) at 7 out of 10 with activity. Patient has worsening of pain with activity and weight bearing and pain that interferes with activities of daily living.  Patient has evidence of joint space narrowing by imaging studies. There is no active infection.  Patient Active Problem List   Diagnosis Date Noted  . SSS (sick sinus syndrome) ([place]) 04/20/2016  . Paroxysmal atrial fibrillation ([place]) 12/17/2013  . NSVT (nonsustained ventricular tachycardia) ([employer]) 01/23/2013  . Bradycardia, symptomatic 12/06/2011  . Pacemaker, dual-chamber [employer], sinus node dysfunction, implanted 12/05/11 12/06/2011  . HTN (hypertension), NL LVF by echo 12/06/2011   Past Medical History:  Diagnosis Date  . AF (paroxysmal atrial fibrillation) ([place])   . Arthritis   . Cancer [facility])    SKIN CANCER  . Depression   . Hypertension   . Pacemaker 11/2011   [employer] for [employer]  . Sinus node dysfunction [facility]    [employer] pacemaker 12/05/11    Past Surgical History:  Procedure Laterality Date  . FINGER SURGERY     LEFT THUMB   11/2016  . INSERTION OF MESH N/A 05/31/2017   Procedure: INSERTION OF MESH;  Surgeon: Donnie Mesa, MD;  Location: [place];  Service: General;  Laterality: N/A;  . PERMANENT PACEMAKER INSERTION  12/05/2011   [employer]  . PERMANENT PACEMAKER INSERTION N/A 12/05/2011   Procedure: PERMANENT  PACEMAKER INSERTION;  Surgeon: Sanda Klein, MD;  Location: [place] CATH LAB;  Service: Cardiovascular;  Laterality: N/A;  . REPLACEMENT TOTAL KNEE  03/23/2009   right  . TONSILLECTOMY    . UMBILICAL HERNIA REPAIR N/A 05/31/2017   Procedure: HERNIA REPAIR UMBILICAL ERAS PATHWAY;  Surgeon: Donnie Mesa, MD;  Location: [place];  Service: General;  Laterality: N/A;  LMA ONLY  . US ECHOCARDIOGRAPHY  11/01/2011   mildly dilated LA    Current Facility-Administered Medications  Medication Dose Route Frequency Provider Last Rate Last Admin  . [START ON 03/16/2020] bupivacaine liposome (EXPAREL) 1.3 % injection 266 mg  20 mL Other Once Gaynelle Arabian, MD       Current Outpatient Medications  Medication Sig Dispense Refill Last Dose  . allopurinol (ZYLOPRIM) 100 MG tablet Take 100 mg by mouth daily.  9   . atorvastatin (LIPITOR) 20 MG tablet Take 20 mg by mouth every evening.   2   . benazepril (LOTENSIN) 20 MG tablet Take 20 mg by mouth daily.     Marland Kitchen buPROPion (WELLBUTRIN SR) 150 MG 12 hr tablet Take 150 mg by mouth 2 (two) times daily.     . clindamycin (CLEOCIN) 300 MG capsule Take 600 mg by mouth See admin instructions. Take 600 mg by mouth prior to dental procedures     . clonazePAM (KLONOPIN) 0.5 MG tablet Take 0.5 mg by mouth daily as needed for anxiety.      . fexofenadine (ALLEGRA) 180 MG tablet Take 180 mg by mouth daily as needed for allergies.      Marland Kitchen  fluticasone (FLONASE) 50 MCG/ACT nasal spray Place 1 spray into both nostrils daily as needed for allergies.      . hydrochlorothiazide (HYDRODIURIL) 25 MG tablet Take 25 mg by mouth daily.     Marland Kitchen ibuprofen (ADVIL,MOTRIN) 200 MG tablet Take 600-800 mg by mouth every 8 (eight) hours as needed for moderate pain.      . pantoprazole (PROTONIX) 40 MG tablet Take 40 mg by mouth daily.     . rivaroxaban (XARELTO) 20 MG TABS tablet TAKE 1 TABLET(20 MG) BY MOUTH DAILY WITH DINNER (Patient taking differently: Take 20 mg by mouth. ) 30 tablet 0   . sertraline  (ZOLOFT) 100 MG tablet Take 100 mg by mouth daily.      . Magnesium Oxide 500 MG CAPS Take 500 mg by mouth 2 (two) times daily. (Patient not taking: Reported on 02/27/2020)   Not Taking at Unknown time  . Multiple Vitamin (MULTIVITAMIN WITH MINERALS) TABS Take 1 tablet by mouth daily. (Patient not taking: Reported on 02/27/2020)   Not Taking at Unknown time   Allergies  Allergen Reactions  . Penicillins Hives    Has patient had a PCN reaction causing immediate rash, facial/tongue/throat swelling, SOB or lightheadedness with hypotension: Unknown Has patient had a PCN reaction causing severe rash involving mucus membranes or skin necrosis: Unknown Has patient had a PCN reaction that required hospitalization: No Has patient had a PCN reaction occurring within the last 10 years: No If all of the above answers are "NO", then may proceed with Cephalosporin use.     Social History   Tobacco Use  . Smoking status: Never Smoker  . Smokeless tobacco: Never Used  Substance Use Topics  . Alcohol use: Yes    Comment: OCC WINE     Family History  Problem Relation Age of Onset  . Diabetes Father      Review of Systems  Constitutional: Negative for chills and fever.  Respiratory: Negative for cough and shortness of breath.   Cardiovascular: Negative for chest pain.  Gastrointestinal: Negative for nausea and vomiting.  Musculoskeletal: Positive for arthralgias.    Objective:  Physical Exam Patient is a 74 year old male.  Well nourished and well developed. General: Alert and oriented x3, cooperative and pleasant, no acute distress. Head: normocephalic, atraumatic, neck supple. Eyes: EOMI. Respiratory: breath sounds clear in all fields, no wheezing, rales, or rhonchi. Cardiovascular: Regular rate and rhythm, no murmurs, gallops or rubs. Abdomen: non-tender to palpation and soft, normoactive bowel sounds. Musculoskeletal:  Left Knee Exam: Varus deformity. No effusion present. No  swelling present. Moderate crepitus on range of motion of the knee. Medial joint line tenderness. No lateral joint line tenderness. The range of motion is: 5 to 120 degrees. The knee is stable.  Calves soft and nontender. Motor function intact in LE. Strength 5/5 LE bilaterally. Neuro: Distal pulses 2+. Sensation to light touch intact in LE.  Vital signs in last 24 hours:    Labs:   Estimated body mass index is 31.49 kg/m as calculated from the following:   Height as of 03/11/20: 6' (1.829 m).   Weight as of 03/11/20: 105.3 kg.   Imaging Review Plain radiographs demonstrate severe degenerative joint disease of the left knee(s). The overall alignment isneutral. The bone quality appears to be adequate for age and reported activity level.      Assessment/Plan:  End stage arthritis, left knee   The patient history, physical examination, clinical judgment of the provider and imaging studies are  consistent with end stage degenerative joint disease of the left knee(s) and total knee arthroplasty is deemed medically necessary. The treatment options including medical management, injection therapy arthroscopy and arthroplasty were discussed at length. The risks and benefits of total knee arthroplasty were presented and reviewed. The risks due to aseptic loosening, infection, stiffness, patella tracking problems, thromboembolic complications and other imponderables were discussed. The patient acknowledged the explanation, agreed to proceed with the plan and consent was signed. Patient is being admitted for inpatient treatment for surgery, pain control, PT, OT, prophylactic antibiotics, VTE prophylaxis, progressive ambulation and ADL's and discharge planning. The patient is planning to be discharged hone.   Therapy Plans: outpatient therapy at [facility] Disposition: Home with wife Planned DVT Prophylaxis: Xarelto 20 mg daily DME needed: none PCP: Dr. Ashby Dawes, clearance  received Cardiologist: Dr. Sallyanne Kuster, clearance received TXA: IV Allergies: PCN - hives as a child Anesthesia Concerns: none BMI: 31.5 Not diabetic.  Other: Planning on staying overnight. Tolerates oxycodone. Has pacemaker. Hx of paroxysmal a fib, on Xarelto.    - Patient was instructed on what medications to stop prior to surgery. - Follow-up visit in 2 weeks with Dr. Wynelle Link - Begin physical therapy following surgery - Pre-operative lab work as pre-surgical testing - Prescriptions will be provided in hospital at time of discharge   Patient's anticipated LOS is less than 2 midnights, meeting these requirements: - Younger than 14 - Lives within 1 hour of care - Has a competent adult at home to recover with post-op recover - NO history of  - Chronic pain requiring opiods  - Diabetes  - Coronary Artery Disease  - Heart failure  - Heart attack  - Stroke  - DVT/VTE  - Cardiac arrhythmia  - Respiratory Failure/COPD  - Renal failure  - Anemia  - Advanced Liver disease   Griffith Citron, PA-C Orthopedic Surgery [facility] [place] [phone]

## 2020-03-16 ENCOUNTER — Ambulatory Visit (HOSPITAL_COMMUNITY): Payer: PPO | Admitting: Anesthesiology

## 2020-03-16 ENCOUNTER — Encounter (HOSPITAL_COMMUNITY)
Admission: RE | Disposition: A | Discharge status: Home / Self Care | Payer: Self-pay | Source: Home / Self Care | Attending: Orthopedic Surgery

## 2020-03-16 ENCOUNTER — Observation Stay (HOSPITAL_COMMUNITY)
Admission: RE | Admit: 2020-03-16 | Discharge: 2020-03-17 | Disposition: A | Discharge status: Home / Self Care | Payer: PPO | Attending: Orthopedic Surgery | Admitting: Orthopedic Surgery

## 2020-03-16 ENCOUNTER — Ambulatory Visit (HOSPITAL_COMMUNITY): Payer: PPO | Admitting: Physician Assistant

## 2020-03-16 ENCOUNTER — Other Ambulatory Visit: Payer: Self-pay

## 2020-03-16 ENCOUNTER — Encounter (HOSPITAL_COMMUNITY): Payer: Self-pay | Admitting: Orthopedic Surgery

## 2020-03-16 DIAGNOSIS — M1712 Unilateral primary osteoarthritis, left knee: Principal | ICD-10-CM | POA: Diagnosis present

## 2020-03-16 DIAGNOSIS — I10 Essential (primary) hypertension: Secondary | ICD-10-CM | POA: Insufficient documentation

## 2020-03-16 DIAGNOSIS — Z7901 Long term (current) use of anticoagulants: Secondary | ICD-10-CM | POA: Diagnosis not present

## 2020-03-16 DIAGNOSIS — Z950 Presence of cardiac pacemaker: Secondary | ICD-10-CM | POA: Insufficient documentation

## 2020-03-16 DIAGNOSIS — Z85828 Personal history of other malignant neoplasm of skin: Secondary | ICD-10-CM | POA: Diagnosis not present

## 2020-03-16 DIAGNOSIS — Z79899 Other long term (current) drug therapy: Secondary | ICD-10-CM | POA: Insufficient documentation

## 2020-03-16 DIAGNOSIS — G8918 Other acute postprocedural pain: Secondary | ICD-10-CM | POA: Diagnosis not present

## 2020-03-16 DIAGNOSIS — M25562 Pain in left knee: Secondary | ICD-10-CM | POA: Diagnosis present

## 2020-03-16 DIAGNOSIS — I480 Paroxysmal atrial fibrillation: Secondary | ICD-10-CM | POA: Diagnosis not present

## 2020-03-16 DIAGNOSIS — Z96651 Presence of right artificial knee joint: Secondary | ICD-10-CM | POA: Insufficient documentation

## 2020-03-16 HISTORY — PX: TOTAL KNEE ARTHROPLASTY: SHX125

## 2020-03-16 LAB — TYPE AND SCREEN
ABO/RH(D): B POS
Antibody Screen: NEGATIVE

## 2020-03-16 SURGERY — ARTHROPLASTY, KNEE, TOTAL
Anesthesia: Spinal | Site: Knee | Laterality: Left

## 2020-03-16 MED ORDER — PHENYLEPHRINE 40 MCG/ML (10ML) SYRINGE FOR IV PUSH (FOR BLOOD PRESSURE SUPPORT)
PREFILLED_SYRINGE | INTRAVENOUS | Status: AC
  Filled 2020-03-16: qty 20

## 2020-03-16 MED ORDER — CLONAZEPAM 0.5 MG PO TABS: 0.5000 mg | ORAL_TABLET | Freq: Every day | ORAL | Status: DC | PRN

## 2020-03-16 MED ORDER — POLYETHYLENE GLYCOL 3350 17 G PO PACK: 17.0000 g | PACK | Freq: Every day | ORAL | Status: DC | PRN

## 2020-03-16 MED ORDER — SODIUM CHLORIDE 0.9 % IR SOLN
Status: DC | PRN
  Administered 2020-03-16: 1000 mL

## 2020-03-16 MED ORDER — FENTANYL CITRATE (PF) 100 MCG/2ML IJ SOLN
50.0000 ug | INTRAMUSCULAR | Status: DC
  Administered 2020-03-16: 50 ug via INTRAVENOUS
  Filled 2020-03-16: qty 2

## 2020-03-16 MED ORDER — ATORVASTATIN CALCIUM 20 MG PO TABS
20.0000 mg | ORAL_TABLET | Freq: Every evening | ORAL | Status: DC
  Administered 2020-03-16: 20 mg via ORAL
  Filled 2020-03-16: qty 1

## 2020-03-16 MED ORDER — CLONIDINE HCL (ANALGESIA) 100 MCG/ML EP SOLN
EPIDURAL | Status: DC | PRN
  Administered 2020-03-16: 50 ug

## 2020-03-16 MED ORDER — MORPHINE SULFATE (PF) 2 MG/ML IV SOLN: 0.5000 mg | INTRAVENOUS | Status: DC | PRN

## 2020-03-16 MED ORDER — FENTANYL CITRATE (PF) 100 MCG/2ML IJ SOLN
INTRAMUSCULAR | Status: DC | PRN
  Administered 2020-03-16: 100 ug via INTRAVENOUS

## 2020-03-16 MED ORDER — FLEET ENEMA 7-19 GM/118ML RE ENEM: 1.0000 | ENEMA | Freq: Once | RECTAL | Status: DC | PRN

## 2020-03-16 MED ORDER — DEXAMETHASONE SODIUM PHOSPHATE 10 MG/ML IJ SOLN: 10.0000 mg | Freq: Once | INTRAMUSCULAR | Status: DC

## 2020-03-16 MED ORDER — EPHEDRINE SULFATE 50 MG/ML IJ SOLN
INTRAMUSCULAR | Status: DC | PRN
  Administered 2020-03-16 (×3): 10 mg via INTRAVENOUS

## 2020-03-16 MED ORDER — METHOCARBAMOL 500 MG IVPB - SIMPLE MED
500.0000 mg | Freq: Four times a day (QID) | INTRAVENOUS | Status: DC | PRN
  Filled 2020-03-16: qty 50

## 2020-03-16 MED ORDER — GABAPENTIN 100 MG PO CAPS
100.0000 mg | ORAL_CAPSULE | Freq: Three times a day (TID) | ORAL | Status: DC
  Administered 2020-03-16 – 2020-03-17 (×3): 100 mg via ORAL
  Filled 2020-03-16 (×3): qty 1

## 2020-03-16 MED ORDER — METOCLOPRAMIDE HCL 5 MG/ML IJ SOLN: 5.0000 mg | Freq: Three times a day (TID) | INTRAMUSCULAR | Status: DC | PRN

## 2020-03-16 MED ORDER — LACTATED RINGERS IV SOLN: INTRAVENOUS | Status: DC

## 2020-03-16 MED ORDER — STERILE WATER FOR IRRIGATION IR SOLN
Status: DC | PRN
  Administered 2020-03-16: 1000 mL

## 2020-03-16 MED ORDER — SODIUM CHLORIDE (PF) 0.9 % IJ SOLN
INTRAMUSCULAR | Status: AC
  Filled 2020-03-16: qty 50

## 2020-03-16 MED ORDER — ONDANSETRON HCL 4 MG PO TABS: 4.0000 mg | ORAL_TABLET | Freq: Four times a day (QID) | ORAL | Status: DC | PRN

## 2020-03-16 MED ORDER — SODIUM CHLORIDE (PF) 0.9 % IJ SOLN
INTRAMUSCULAR | Status: AC
  Filled 2020-03-16: qty 10

## 2020-03-16 MED ORDER — SODIUM CHLORIDE (PF) 0.9 % IJ SOLN
INTRAMUSCULAR | Status: DC | PRN
  Administered 2020-03-16: 60 mL

## 2020-03-16 MED ORDER — BUPIVACAINE LIPOSOME 1.3 % IJ SUSP
INTRAMUSCULAR | Status: DC | PRN
  Administered 2020-03-16: 20 mL

## 2020-03-16 MED ORDER — DOCUSATE SODIUM 100 MG PO CAPS
100.0000 mg | ORAL_CAPSULE | Freq: Two times a day (BID) | ORAL | Status: DC
  Administered 2020-03-16 – 2020-03-17 (×2): 100 mg via ORAL
  Filled 2020-03-16 (×2): qty 1

## 2020-03-16 MED ORDER — 0.9 % SODIUM CHLORIDE (POUR BTL) OPTIME
TOPICAL | Status: DC | PRN
  Administered 2020-03-16: 1000 mL

## 2020-03-16 MED ORDER — BISACODYL 10 MG RE SUPP: 10.0000 mg | Freq: Every day | RECTAL | Status: DC | PRN

## 2020-03-16 MED ORDER — TRANEXAMIC ACID-NACL 1000-0.7 MG/100ML-% IV SOLN
1000.0000 mg | INTRAVENOUS | Status: AC
  Administered 2020-03-16: 1000 mg via INTRAVENOUS
  Filled 2020-03-16: qty 100

## 2020-03-16 MED ORDER — CEFAZOLIN SODIUM-DEXTROSE 2-4 GM/100ML-% IV SOLN
2.0000 g | Freq: Four times a day (QID) | INTRAVENOUS | Status: AC
  Administered 2020-03-16 (×2): 2 g via INTRAVENOUS
  Filled 2020-03-16 (×2): qty 100

## 2020-03-16 MED ORDER — SODIUM CHLORIDE 0.9 % IV SOLN: INTRAVENOUS | Status: DC

## 2020-03-16 MED ORDER — ALLOPURINOL 100 MG PO TABS
100.0000 mg | ORAL_TABLET | Freq: Every day | ORAL | Status: DC
  Administered 2020-03-17: 100 mg via ORAL
  Filled 2020-03-16: qty 1

## 2020-03-16 MED ORDER — MENTHOL 3 MG MT LOZG: 1.0000 | LOZENGE | OROMUCOSAL | Status: DC | PRN

## 2020-03-16 MED ORDER — POVIDONE-IODINE 10 % EX SWAB
2.0000 "application " | Freq: Once | CUTANEOUS | Status: AC
  Administered 2020-03-16: 2 via TOPICAL

## 2020-03-16 MED ORDER — ONDANSETRON HCL 4 MG/2ML IJ SOLN
INTRAMUSCULAR | Status: DC | PRN
  Administered 2020-03-16: 4 mg via INTRAVENOUS

## 2020-03-16 MED ORDER — ACETAMINOPHEN 325 MG PO TABS: 325.0000 mg | ORAL_TABLET | Freq: Four times a day (QID) | ORAL | Status: DC | PRN

## 2020-03-16 MED ORDER — ONDANSETRON HCL 4 MG/2ML IJ SOLN: 4.0000 mg | Freq: Once | INTRAMUSCULAR | Status: DC | PRN

## 2020-03-16 MED ORDER — BUPROPION HCL ER (SR) 150 MG PO TB12
150.0000 mg | ORAL_TABLET | Freq: Two times a day (BID) | ORAL | Status: DC
  Administered 2020-03-16 – 2020-03-17 (×2): 150 mg via ORAL
  Filled 2020-03-16 (×2): qty 1

## 2020-03-16 MED ORDER — METOCLOPRAMIDE HCL 5 MG PO TABS: 5.0000 mg | ORAL_TABLET | Freq: Three times a day (TID) | ORAL | Status: DC | PRN

## 2020-03-16 MED ORDER — EPHEDRINE 5 MG/ML INJ
INTRAVENOUS | Status: AC
  Filled 2020-03-16: qty 10

## 2020-03-16 MED ORDER — ROPIVACAINE HCL 7.5 MG/ML IJ SOLN
INTRAMUSCULAR | Status: DC | PRN
  Administered 2020-03-16: 20 mL via PERINEURAL

## 2020-03-16 MED ORDER — CEFAZOLIN SODIUM-DEXTROSE 2-4 GM/100ML-% IV SOLN
2.0000 g | INTRAVENOUS | Status: AC
  Administered 2020-03-16: 2 g via INTRAVENOUS
  Filled 2020-03-16: qty 100

## 2020-03-16 MED ORDER — METHOCARBAMOL 500 MG PO TABS
500.0000 mg | ORAL_TABLET | Freq: Four times a day (QID) | ORAL | Status: DC | PRN
  Administered 2020-03-17 (×2): 500 mg via ORAL
  Filled 2020-03-16 (×2): qty 1

## 2020-03-16 MED ORDER — FENTANYL CITRATE (PF) 100 MCG/2ML IJ SOLN: 25.0000 ug | INTRAMUSCULAR | Status: DC | PRN

## 2020-03-16 MED ORDER — ORAL CARE MOUTH RINSE: 15.0000 mL | Freq: Once | OROMUCOSAL | Status: AC

## 2020-03-16 MED ORDER — ONDANSETRON HCL 4 MG/2ML IJ SOLN: 4.0000 mg | Freq: Four times a day (QID) | INTRAMUSCULAR | Status: DC | PRN

## 2020-03-16 MED ORDER — MIDAZOLAM HCL 2 MG/2ML IJ SOLN
1.0000 mg | INTRAMUSCULAR | Status: DC
  Administered 2020-03-16: 1 mg via INTRAVENOUS
  Filled 2020-03-16: qty 2

## 2020-03-16 MED ORDER — PANTOPRAZOLE SODIUM 40 MG PO TBEC
40.0000 mg | DELAYED_RELEASE_TABLET | Freq: Every day | ORAL | Status: DC
  Administered 2020-03-17: 40 mg via ORAL
  Filled 2020-03-16: qty 1

## 2020-03-16 MED ORDER — HYDROCHLOROTHIAZIDE 25 MG PO TABS
25.0000 mg | ORAL_TABLET | Freq: Every day | ORAL | Status: DC
  Administered 2020-03-17: 25 mg via ORAL
  Filled 2020-03-16: qty 1

## 2020-03-16 MED ORDER — PHENOL 1.4 % MT LIQD: 1.0000 | OROMUCOSAL | Status: DC | PRN

## 2020-03-16 MED ORDER — SERTRALINE HCL 100 MG PO TABS
100.0000 mg | ORAL_TABLET | Freq: Every day | ORAL | Status: DC
  Administered 2020-03-17: 100 mg via ORAL
  Filled 2020-03-16: qty 1

## 2020-03-16 MED ORDER — PROPOFOL 500 MG/50ML IV EMUL
INTRAVENOUS | Status: DC | PRN
  Administered 2020-03-16: 75 ug/kg/min via INTRAVENOUS

## 2020-03-16 MED ORDER — DIPHENHYDRAMINE HCL 12.5 MG/5ML PO ELIX: 12.5000 mg | ORAL_SOLUTION | ORAL | Status: DC | PRN

## 2020-03-16 MED ORDER — ACETAMINOPHEN 10 MG/ML IV SOLN
1000.0000 mg | Freq: Four times a day (QID) | INTRAVENOUS | Status: DC
  Administered 2020-03-16: 1000 mg via INTRAVENOUS
  Filled 2020-03-16: qty 100

## 2020-03-16 MED ORDER — OXYCODONE HCL 5 MG PO TABS: 5.0000 mg | ORAL_TABLET | ORAL | Status: DC | PRN

## 2020-03-16 MED ORDER — CHLORHEXIDINE GLUCONATE 0.12 % MT SOLN
15.0000 mL | Freq: Once | OROMUCOSAL | Status: AC
  Administered 2020-03-16: 15 mL via OROMUCOSAL

## 2020-03-16 MED ORDER — RIVAROXABAN 10 MG PO TABS
10.0000 mg | ORAL_TABLET | Freq: Every day | ORAL | Status: DC
  Administered 2020-03-17: 10 mg via ORAL
  Filled 2020-03-16: qty 1

## 2020-03-16 MED ORDER — OXYCODONE HCL 5 MG PO TABS
10.0000 mg | ORAL_TABLET | ORAL | Status: DC | PRN
  Administered 2020-03-16 – 2020-03-17 (×5): 10 mg via ORAL
  Filled 2020-03-16 (×5): qty 2

## 2020-03-16 MED ORDER — FENTANYL CITRATE (PF) 100 MCG/2ML IJ SOLN
INTRAMUSCULAR | Status: AC
  Filled 2020-03-16: qty 2

## 2020-03-16 MED ORDER — PHENYLEPHRINE HCL (PRESSORS) 10 MG/ML IV SOLN
INTRAVENOUS | Status: DC | PRN
  Administered 2020-03-16 (×2): 80 ug via INTRAVENOUS
  Administered 2020-03-16: 120 ug via INTRAVENOUS
  Administered 2020-03-16 (×3): 80 ug via INTRAVENOUS

## 2020-03-16 MED ORDER — DEXAMETHASONE SODIUM PHOSPHATE 10 MG/ML IJ SOLN
8.0000 mg | Freq: Once | INTRAMUSCULAR | Status: AC
  Administered 2020-03-16: 10 mg via INTRAVENOUS

## 2020-03-16 SURGICAL SUPPLY — 59 items
BAG SPEC THK2 15X12 ZIP CLS (MISCELLANEOUS) ×1
BAG ZIPLOCK 12X15 (MISCELLANEOUS) ×3 IMPLANT
BLADE SAG 18X100X1.27 (BLADE) ×3 IMPLANT
BLADE SAW SGTL 11.0X1.19X90.0M (BLADE) ×3 IMPLANT
BLADE SURG SZ10 CARB STEEL (BLADE) ×6 IMPLANT
BNDG CMPR MED 10X6 ELC LF (GAUZE/BANDAGES/DRESSINGS) ×1
BNDG ELASTIC 6X10 VLCR STRL LF (GAUZE/BANDAGES/DRESSINGS) ×2 IMPLANT
BNDG ELASTIC 6X5.8 VLCR STR LF (GAUZE/BANDAGES/DRESSINGS) ×3 IMPLANT
BOWL SMART MIX CTS (DISPOSABLE) ×3 IMPLANT
CEMENT HV SMART SET (Cement) ×6 IMPLANT
CEMENT TIBIA MBT SIZE 5 (Knees) IMPLANT
CLOSURE STERI-STRIP 1/2X4 (GAUZE/BANDAGES/DRESSINGS) ×1
CLOSURE WOUND 1/2 X4 (GAUZE/BANDAGES/DRESSINGS) ×2
CLSR STERI-STRIP ANTIMIC 1/2X4 (GAUZE/BANDAGES/DRESSINGS) ×1 IMPLANT
COVER SURGICAL LIGHT HANDLE (MISCELLANEOUS) ×3 IMPLANT
COVER WAND RF STERILE (DRAPES) IMPLANT
CUFF TOURN SGL QUICK 34 (TOURNIQUET CUFF) ×3
CUFF TRNQT CYL 34X4.125X (TOURNIQUET CUFF) ×1 IMPLANT
DECANTER SPIKE VIAL GLASS SM (MISCELLANEOUS) ×3 IMPLANT
DRAPE U-SHAPE 47X51 STRL (DRAPES) ×3 IMPLANT
DRSG AQUACEL AG ADV 3.5X 4 (GAUZE/BANDAGES/DRESSINGS) ×2 IMPLANT
DRSG AQUACEL AG ADV 3.5X10 (GAUZE/BANDAGES/DRESSINGS) ×3 IMPLANT
DURAPREP 26ML APPLICATOR (WOUND CARE) ×3 IMPLANT
ELECT REM PT RETURN 15FT ADLT (MISCELLANEOUS) ×3 IMPLANT
FEMUR SIGMA PS SZ 5.0 L (Femur) ×2 IMPLANT
GLOVE BIO SURGEON STRL SZ7 (GLOVE) ×3 IMPLANT
GLOVE BIO SURGEON STRL SZ8 (GLOVE) ×3 IMPLANT
GLOVE BIOGEL PI IND STRL 7.0 (GLOVE) ×1 IMPLANT
GLOVE BIOGEL PI IND STRL 8 (GLOVE) ×1 IMPLANT
GLOVE BIOGEL PI INDICATOR 7.0 (GLOVE) ×2
GLOVE BIOGEL PI INDICATOR 8 (GLOVE) ×2
GOWN STRL REUS W/TWL LRG LVL3 (GOWN DISPOSABLE) ×6 IMPLANT
HANDPIECE INTERPULSE COAX TIP (DISPOSABLE) ×3
HOLDER FOLEY CATH W/STRAP (MISCELLANEOUS) IMPLANT
IMMOBILIZER KNEE 20 (SOFTGOODS) ×3
IMMOBILIZER KNEE 20 THIGH 36 (SOFTGOODS) ×1 IMPLANT
KIT TURNOVER KIT A (KITS) IMPLANT
MANIFOLD NEPTUNE II (INSTRUMENTS) ×3 IMPLANT
NS IRRIG 1000ML POUR BTL (IV SOLUTION) ×3 IMPLANT
PACK TOTAL KNEE CUSTOM (KITS) ×3 IMPLANT
PADDING CAST ABS 6INX4YD NS (CAST SUPPLIES) ×2
PADDING CAST ABS COTTON 6X4 NS (CAST SUPPLIES) IMPLANT
PADDING CAST COTTON 6X4 STRL (CAST SUPPLIES) ×6 IMPLANT
PATELLA DOME PFC 41MM (Knees) ×2 IMPLANT
PENCIL SMOKE EVACUATOR (MISCELLANEOUS) ×3 IMPLANT
PIN STEINMAN FIXATION KNEE (PIN) ×2 IMPLANT
PLATE ROT INSERT 10MM SIZE 5 (Plate) ×2 IMPLANT
PROTECTOR NERVE ULNAR (MISCELLANEOUS) ×3 IMPLANT
SET HNDPC FAN SPRY TIP SCT (DISPOSABLE) ×1 IMPLANT
STRIP CLOSURE SKIN 1/2X4 (GAUZE/BANDAGES/DRESSINGS) ×4 IMPLANT
SUT MNCRL AB 4-0 PS2 18 (SUTURE) ×3 IMPLANT
SUT STRATAFIX 0 PDS 27 VIOLET (SUTURE) ×3
SUT VIC AB 2-0 CT1 27 (SUTURE) ×9
SUT VIC AB 2-0 CT1 TAPERPNT 27 (SUTURE) ×3 IMPLANT
SUTURE STRATFX 0 PDS 27 VIOLET (SUTURE) ×1 IMPLANT
TIBIA MBT CEMENT SIZE 5 (Knees) ×3 IMPLANT
TRAY FOLEY MTR SLVR 16FR STAT (SET/KITS/TRAYS/PACK) ×3 IMPLANT
WATER STERILE IRR 1000ML POUR (IV SOLUTION) ×6 IMPLANT
WRAP KNEE MAXI GEL POST OP (GAUZE/BANDAGES/DRESSINGS) ×3 IMPLANT

## 2020-03-16 NOTE — Progress Notes (Signed)
Orthopedic Tech Progress Note Patient Details:  George Wolf 01-11-1946 [id number]  Ortho Devices Type of Ortho Device: CPM padding Ortho Device/Splint Location: patient already has knee immobilizer Ortho Device/Splint Interventions: Ordered, Application, Adjustment  off cpm Post Interventions Patient Tolerated: Well Instructions Provided: Care of device   [name] 03/16/2020, 4:28 PM

## 2020-03-16 NOTE — Discharge Instructions (Signed)
 Frank Aluisio, MD Total Joint Specialist [facility] [address] [place], [place] [address] [phone]  TOTAL KNEE REPLACEMENT POSTOPERATIVE DIRECTIONS    Knee Rehabilitation, Guidelines Following Surgery  Results after knee surgery are often greatly improved when you follow the exercise, range of motion and muscle strengthening exercises prescribed by your doctor. Safety measures are also important to protect the knee from further injury. If any of these exercises cause you to have increased pain or swelling in your knee joint, decrease the amount until you are comfortable again and slowly increase them. If you have problems or questions, call your caregiver or physical therapist for advice.   HOME CARE INSTRUCTIONS  . Remove items at home which could result in a fall. This includes throw rugs or furniture in walking pathways.  . ICE to the affected knee as much as tolerated. Icing helps control swelling. If the swelling is well controlled you will be more comfortable and rehab easier. Continue to use ice on the knee for pain and swelling from surgery. You may notice swelling that will progress down to the foot and ankle. This is normal after surgery. Elevate the leg when you are not up walking on it.    . Continue to use the breathing machine which will help keep your temperature down. It is common for your temperature to cycle up and down following surgery, especially at night when you are not up moving around and exerting yourself. The breathing machine keeps your lungs expanded and your temperature down. . Do not place pillow under the operative knee, focus on keeping the knee straight while resting  DIET You may resume your previous home diet once you are discharged from the hospital.  DRESSING / WOUND CARE / SHOWERING . Keep your bulky bandage on for 2 days. On the third post-operative day you may remove the Ace bandage and gauze. There is a  waterproof adhesive bandage on your skin which will stay in place until your first follow-up appointment. Once you remove this you will not need to place another bandage . You may begin showering 3 days following surgery, but do not submerge the incision under water.  ACTIVITY For the first 5 days, the key is rest and control of pain and swelling . Do your home exercises twice a day starting on post-operative day 3. On the days you go to physical therapy, just do the home exercises once that day. . You should rest, ice and elevate the leg for 50 minutes out of every hour. Get up and walk/stretch for 10 minutes per hour. After 5 days you can increase your activity slowly as tolerated. . Walk with your walker as instructed. Use the walker until you are comfortable transitioning to a cane. Walk with the cane in the opposite hand of the operative leg. You may discontinue the cane once you are comfortable and walking steadily. . Avoid periods of inactivity such as sitting longer than an hour when not asleep. This helps prevent blood clots.  . You may discontinue the knee immobilizer once you are able to perform a straight leg raise while lying down. . You may resume a sexual relationship in one month or when given the OK by your doctor.  . You may return to work once you are cleared by your doctor.  . Do not drive a car for 6 weeks or until released by your surgeon.  . Do not drive while taking narcotics.  TED HOSE   STOCKINGS Wear the elastic stockings on both legs for three weeks following surgery during the day. You may remove them at night for sleeping.  WEIGHT BEARING Weight bearing as tolerated with assist device (walker, cane, etc) as directed, use it as long as suggested by your surgeon or therapist, typically at least 4-6 weeks.  POSTOPERATIVE CONSTIPATION PROTOCOL Constipation - defined medically as fewer than three stools per week and severe constipation as less than one stool per  week.  One of the most common issues patients have following surgery is constipation.  Even if you have a regular bowel pattern at home, your normal regimen is likely to be disrupted due to multiple reasons following surgery.  Combination of anesthesia, postoperative narcotics, change in appetite and fluid intake all can affect your bowels.  In order to avoid complications following surgery, here are some recommendations in order to help you during your recovery period.  . Colace (docusate) - Pick up an over-the-counter form of Colace or another stool softener and take twice a day as long as you are requiring postoperative pain medications.  Take with a full glass of water daily.  If you experience loose stools or diarrhea, hold the colace until you stool forms back up. If your symptoms do not get better within 1 week or if they get worse, check with your doctor. . Dulcolax (bisacodyl) - Pick up over-the-counter and take as directed by the product packaging as needed to assist with the movement of your bowels.  Take with a full glass of water.  Use this product as needed if not relieved by Colace only.  . MiraLax (polyethylene glycol) - Pick up over-the-counter to have on hand. MiraLax is a solution that will increase the amount of water in your bowels to assist with bowel movements.  Take as directed and can mix with a glass of water, juice, soda, coffee, or tea. Take if you go more than two days without a movement. Do not use MiraLax more than once per day. Call your doctor if you are still constipated or irregular after using this medication for 7 days in a row.  If you continue to have problems with postoperative constipation, please contact the office for further assistance and recommendations.  If you experience "the worst abdominal pain ever" or develop nausea or vomiting, please contact the office immediatly for further recommendations for treatment.  ITCHING If you experience itching with your  medications, try taking only a single pain pill, or even half a pain pill at a time.  You can also use Benadryl over the counter for itching or also to help with sleep.   MEDICATIONS See your medication summary on the "After Visit Summary" that the nursing staff will review with you prior to discharge.  You may have some home medications which will be placed on hold until you complete the course of blood thinner medication.  It is important for you to complete the blood thinner medication as prescribed by your surgeon.  Continue your approved medications as instructed at time of discharge.  PRECAUTIONS . If you experience chest pain or shortness of breath - call 911 immediately for transfer to the hospital emergency department.  . If you develop a fever greater that 101 F, purulent drainage from wound, increased redness or drainage from wound, foul odor from the wound/dressing, or calf pain - CONTACT YOUR SURGEON.                                                     FOLLOW-UP APPOINTMENTS Make sure you keep all of your appointments after your operation with your surgeon and caregivers. You should call the office at the above phone number and make an appointment for approximately two weeks after the date of your surgery or on the date instructed by your surgeon outlined in the "After Visit Summary".  RANGE OF MOTION AND STRENGTHENING EXERCISES  Rehabilitation of the knee is important following a knee injury or an operation. After just a few days of immobilization, the muscles of the thigh which control the knee become weakened and shrink (atrophy). Knee exercises are designed to build up the tone and strength of the thigh muscles and to improve knee motion. Often times heat used for twenty to thirty minutes before working out will loosen up your tissues and help with improving the range of motion but do not use heat for the first two weeks following surgery. These exercises can be done on a training  (exercise) mat, on the floor, on a table or on a bed. Use what ever works the best and is most comfortable for you Knee exercises include:  . Leg Lifts - While your knee is still immobilized in a splint or cast, you can do straight leg raises. Lift the leg to 60 degrees, hold for 3 sec, and slowly lower the leg. Repeat 10-20 times 2-3 times daily. Perform this exercise against resistance later as your knee gets better.  . Quad and Hamstring Sets - Tighten up the muscle on the front of the thigh (Quad) and hold for 5-10 sec. Repeat this 10-20 times hourly. Hamstring sets are done by pushing the foot backward against an object and holding for 5-10 sec. Repeat as with quad sets.   Leg Slides: Lying on your back, slowly slide your foot toward your buttocks, bending your knee up off the floor (only go as far as is comfortable). Then slowly slide your foot back down until your leg is flat on the floor again.  Angel Wings: Lying on your back spread your legs to the side as far apart as you can without causing discomfort.  A rehabilitation program following serious knee injuries can speed recovery and prevent re-injury in the future due to weakened muscles. Contact your doctor or a physical therapist for more information on knee rehabilitation.   IF YOU ARE TRANSFERRED TO A SKILLED REHAB FACILITY If the patient is transferred to a skilled rehab facility following release from the hospital, a list of the current medications will be sent to the facility for the patient to continue.  When discharged from the skilled rehab facility, please have the facility set up the patient's Home Health Physical Therapy prior to being released. Also, the skilled facility will be responsible for providing the patient with their medications at time of release from the facility to include their pain medication, the muscle relaxants, and their blood thinner medication. If the patient is still at the rehab facility at time of the two  week follow up appointment, the skilled rehab facility will also need to assist the patient in arranging follow up appointment in our office and any transportation needs.  MAKE SURE YOU:  . Understand these instructions.  . Get help right away if you are not doing well or get worse.   DENTAL ANTIBIOTICS:  In most cases prophylactic antibiotics for Dental procdeures after total joint surgery are not necessary.  Exceptions are as follows:  1. History of prior total joint infection  2. Severely immunocompromised (  Organ Transplant, cancer chemotherapy, Rheumatoid biologic meds such as Humera)  3. Poorly controlled diabetes (A1C &gt; 8.0, blood glucose over 200)  If you have one of these conditions, contact your surgeon for an antibiotic prescription, prior to your dental procedure.    Pick up stool softner and laxative for home use following surgery while on pain medications. Do not submerge incision under water. Please use good hand washing techniques while changing dressing each day. May shower starting three days after surgery. Please use a clean towel to pat the incision dry following showers. Continue to use ice for pain and swelling after surgery. Do not use any lotions or creams on the incision until instructed by your surgeon.  

## 2020-03-16 NOTE — Anesthesia Procedure Notes (Signed)
Anesthesia Regional Block: Adductor canal block   Pre-Anesthetic Checklist: ,, timeout performed, Correct Patient, Correct Site, Correct Laterality, Correct Procedure, Correct Position, site marked, Risks and benefits discussed,  Surgical consent,  Pre-op evaluation,  At surgeon's request and post-op pain management  Laterality: Left  Prep: chloraprep       Needles:  Injection technique: Single-shot  Needle Type: Echogenic Needle     Needle Length: 9cm  Needle Gauge: 21     Additional Needles:   Procedures:,,,, ultrasound used (permanent image in chart),,,,  Narrative:  Start time: 03/16/2020 9:19 AM End time: 03/16/2020 9:24 AM Injection made incrementally with aspirations every 5 mL.  Performed by: Personally  Anesthesiologist: Catalina Gravel, MD  Additional Notes: No pain on injection. No increased resistance to injection. Injection made in 5cc increments.  Good needle visualization.  Patient tolerated procedure well.

## 2020-03-16 NOTE — Progress Notes (Signed)
AssistedDr. Turk with left, ultrasound guided, adductor canal block. Side rails up, monitors on throughout procedure. See vital signs in flow sheet. Tolerated Procedure well.  

## 2020-03-16 NOTE — Transfer of Care (Signed)
Immediate Anesthesia Transfer of Care Note  Patient: George Wolf  Procedure(s) Performed: TOTAL KNEE ARTHROPLASTY (Left Knee)  Patient Location: PACU  Anesthesia Type:Spinal  Level of Consciousness: awake, alert  and oriented  Airway & Oxygen Therapy: Patient Spontanous Breathing and Patient connected to face mask oxygen  Post-op Assessment: Report given to RN and Post -op Vital signs reviewed and stable  Post vital signs: Reviewed and stable  Last Vitals:  Vitals Value Taken Time  BP 108/74 03/16/20 1158  Temp    Pulse 63 03/16/20 1200  Resp 12 03/16/20 1200  SpO2 99 % 03/16/20 1200  Vitals shown include unvalidated device data.  Last Pain:  Vitals:   03/16/20 0842  TempSrc: Oral  PainSc:       Patients Stated Pain Goal: 3 (09/62/83 6629)  Complications: No complications documented.

## 2020-03-16 NOTE — Evaluation (Signed)
Physical Therapy Evaluation Patient Details Name: George Wolf MRN: [id number] DOB: [birth year] Today's Date: 03/16/2020   History of Present Illness  74 yo male s/p L TKA 03/16/20. Hx of A fib, SSS, pacemaker, NSVT, R TKA (remote)  Clinical Impression  On eval POD 0, pt was Min guard for mobility. He walked ~50 feet with a RW. Moderate pain with activity. Pt is motivated to progress and regain independence. Plan is for d/c home on tomorrow as long as he continues to do well.     Follow Up Recommendations Follow surgeons recommendation for DC plan and follow-up therapies    Equipment Recommendations  None recommended by PT    Recommendations for Other Services       Precautions / Restrictions Precautions Precautions: Fall;Knee Restrictions Weight Bearing Restrictions: No Other Position/Activity Restrictions: WBAT      Mobility  Bed Mobility Overal bed mobility: Needs Assistance Bed Mobility: Supine to Sit     Supine to sit: Supervision;HOB elevated        Transfers Overall transfer level: Needs assistance Equipment used: Rolling walker (2 wheeled) Transfers: Sit to/from Stand Sit to Stand: Min guard;From elevated surface         General transfer comment: VCs safety, technique, hand/LE placement.  Ambulation/Gait Ambulation/Gait assistance: Min guard Gait Distance (Feet): 50 Feet Assistive device: Rolling walker (2 wheeled) Gait Pattern/deviations: Step-to pattern     General Gait Details: VCs safety, technique, sequence. Min guard for safety. Pt denied dizziness. Tolerated distance well. Minimal increase in pain.  Stairs            Wheelchair Mobility    Modified Rankin (Stroke Patients Only)       Balance Overall balance assessment: Needs assistance         Standing balance support: Bilateral upper extremity supported Standing balance-Leahy Scale: Poor                               Pertinent Vitals/Pain Pain  Assessment: 0-10 Pain Score: 5  Pain Location: L knee Pain Descriptors / Indicators: Aching;Sore Pain Intervention(s): Limited activity within patient's tolerance;Monitored during session;Repositioned    Home Living Family/patient expects to be discharged to:: Private residence Living Arrangements: Spouse/significant other Available Help at Discharge: Family Type of Home: House Home Access: Stairs to enter Entrance Stairs-Rails: Right Entrance Stairs-Number of Steps: 4 Home Layout: Bed/bath upstairs;Two level Home Equipment: Walker - 2 wheels;Cane - single point      Prior Function                 Hand Dominance        Extremity/Trunk Assessment        Lower Extremity Assessment Lower Extremity Assessment: Generalized weakness (able to SLR)    Cervical / Trunk Assessment Cervical / Trunk Assessment: Normal  Communication   Communication: No difficulties  Cognition Arousal/Alertness: Awake/alert Behavior During Therapy: WFL for tasks assessed/performed Overall Cognitive Status: Within Functional Limits for tasks assessed                                        General Comments      Exercises     Assessment/Plan    PT Assessment Patient needs continued PT services  PT Problem List Decreased strength;Decreased mobility;Decreased range of motion;Decreased activity tolerance;Decreased balance;Decreased knowledge of use of DME;Pain;Decreased knowledge of  precautions       PT Treatment Interventions DME instruction;Gait training;Therapeutic activities;Therapeutic exercise;Patient/family education;Balance training;Stair training;Functional mobility training    PT Goals (Current goals can be found in the Care Plan section)  Acute Rehab PT Goals Patient Stated Goal: regain PLOF and independence as soon as possible PT Goal Formulation: With patient/family Time For Goal Achievement: 03/30/20 Potential to Achieve Goals: Good    Frequency  7X/week   Barriers to discharge        Co-evaluation               AM-PAC PT "6 Clicks" Mobility  Outcome Measure Help needed turning from your back to your side while in a flat bed without using bedrails?: A Little Help needed moving from lying on your back to sitting on the side of a flat bed without using bedrails?: A Little Help needed moving to and from a bed to a chair (including a wheelchair)?: A Little Help needed standing up from a chair using your arms (e.g., wheelchair or bedside chair)?: A Little Help needed to walk in hospital room?: A Little Help needed climbing 3-5 steps with a railing? : A Little 6 Click Score: 18    End of Session Equipment Utilized During Treatment: Gait belt Activity Tolerance: Patient tolerated treatment well Patient left: in chair;with call bell/phone within reach;with family/visitor present   PT Visit Diagnosis: Pain;Other abnormalities of gait and mobility (R26.89) Pain - Right/Left: Left Pain - part of body: Knee    Time: 1655-1710 PT Time Calculation (min) (ACUTE ONLY): 15 min   Charges:   PT Evaluation $PT Eval Low Complexity: [place], PT Acute Rehabilitation  Office: [phone] Pager: [phone]

## 2020-03-16 NOTE — Progress Notes (Signed)
Orthopedic Tech Progress Note Patient Details:  George Wolf 1946/01/30 [id number]  CPM Left Knee CPM Left Knee: On Left Knee Flexion (Degrees): 40 Left Knee Extension (Degrees): 10  Post Interventions Patient Tolerated: Well Instructions Provided: Care of device Ortho Devices Type of Ortho Device: CPM padding Ortho Device/Splint Location: patient already has knee immobilizer Ortho Device/Splint Interventions: Ordered, Application, Adjustment   Post Interventions Patient Tolerated: Well Instructions Provided: Care of device   [name] 03/16/2020, 12:28 PM

## 2020-03-16 NOTE — Op Note (Signed)
OPERATIVE REPORT-TOTAL KNEE ARTHROPLASTY   Pre-operative diagnosis- Osteoarthritis  Left knee(s)  Post-operative diagnosis- Osteoarthritis Left knee(s)  Procedure-  Left  Total Knee Arthroplasty  Surgeon- Dione Plover. , MD  Assistant- Molli Barrows, PA-C   Anesthesia-  Adductor canal block and spinal  EBL-25 mL   Drains None  Tourniquet time-  Total Tourniquet Time Documented: Thigh (Left) - 43 minutes Total: Thigh (Left) - 43 minutes     Complications- None  Condition-PACU - hemodynamically stable.   Brief Clinical Note   George Wolf is a 74 y.o. year old male with end stage OA of his left knee with progressively worsening pain and dysfunction. He has constant pain, with activity and at rest and significant functional deficits with difficulties even with ADLs. He has had extensive non-op management including analgesics, injections of cortisone and viscosupplements, and home exercise program, but remains in significant pain with significant dysfunction. Radiographs show bone on bone arthritis medial and patellofemoral. He presents now for left Total Knee Arthroplasty.    Procedure in detail---   The patient is brought into the operating room and positioned supine on the operating table. After successful administration of  Adductor canal block and spinal,   a tourniquet is placed high on the  Left thigh(s) and the lower extremity is prepped and draped in the usual sterile fashion. Time out is performed by the operating team and then the  Left lower extremity is wrapped in Esmarch, knee flexed and the tourniquet inflated to 300 mmHg.       A midline incision is made with a ten blade through the subcutaneous tissue to the level of the extensor mechanism. A fresh blade is used to make a medial parapatellar arthrotomy. Soft tissue over the proximal medial tibia is subperiosteally elevated to the joint line with a knife and into the semimembranosus bursa with a Cobb elevator.  Soft tissue over the proximal lateral tibia is elevated with attention being paid to avoiding the patellar tendon on the tibial tubercle. The patella is everted, knee flexed 90 degrees and the ACL and PCL are removed. Findings are bone on bone medial and patellofemoral with massive global osteophytes.        The drill is used to create a starting hole in the distal femur and the canal is thoroughly irrigated with sterile saline to remove the fatty contents. The 5 degree Left  valgus alignment guide is placed into the femoral canal and the distal femoral cutting block is pinned to remove 10 mm off the distal femur. Resection is made with an oscillating saw.      The tibia is subluxed forward and the menisci are removed. The extramedullary alignment guide is placed referencing proximally at the medial aspect of the tibial tubercle and distally along the second metatarsal axis and tibial crest. The block is pinned to remove 5mm off the more deficient medial  side. Resection is made with an oscillating saw. Size 5is the most appropriate size for the tibia and the proximal tibia is prepared with the modular drill and keel punch for that size.      The femoral sizing guide is placed and size 5 is most appropriate. Rotation is marked off the epicondylar axis and confirmed by creating a rectangular flexion gap at 90 degrees. The size 5 cutting block is pinned in this rotation and the anterior, posterior and chamfer cuts are made with the oscillating saw. The intercondylar block is then placed and that cut is made.  Trial size 5 tibial component, trial size 5 posterior stabilized femur and a 10  mm posterior stabilized rotating platform insert trial is placed. Full extension is achieved with excellent varus/valgus and anterior/posterior balance throughout full range of motion. The patella is everted and thickness measured to be 27  mm. Free hand resection is taken to 15 mm, a 41 template is placed, lug holes are  drilled, trial patella is placed, and it tracks normally. Osteophytes are removed off the posterior femur with the trial in place. All trials are removed and the cut bone surfaces prepared with pulsatile lavage. Cement is mixed and once ready for implantation, the size 5 tibial implant, size  5 posterior stabilized femoral component, and the size 41 patella are cemented in place and the patella is held with the clamp. The trial insert is placed and the knee held in full extension. The Exparel (20 ml mixed with 60 ml saline) is injected into the extensor mechanism, posterior capsule, medial and lateral gutters and subcutaneous tissues.  All extruded cement is removed and once the cement is hard the permanent 10 mm posterior stabilized rotating platform insert is placed into the tibial tray.      The wound is copiously irrigated with saline solution and the extensor mechanism closed with # 0 Stratofix suture. The tourniquet is released for a total tourniquet time of 43  minutes. Flexion against gravity is 140 degrees and the patella tracks normally. Subcutaneous tissue is closed with 2.0 vicryl and subcuticular with running 4.0 Monocryl. The incision is cleaned and dried and steri-strips and a bulky sterile dressing are applied. The limb is placed into a knee immobilizer and the patient is awakened and transported to recovery in stable condition.      Please note that a surgical assistant was a medical necessity for this procedure in order to perform it in a safe and expeditious manner. Surgical assistant was necessary to retract the ligaments and vital neurovascular structures to prevent injury to them and also necessary for proper positioning of the limb to allow for anatomic placement of the prosthesis.   Dione Plover , MD    03/16/2020, 11:32 AM

## 2020-03-16 NOTE — Anesthesia Preprocedure Evaluation (Addendum)
Anesthesia Evaluation  Patient identified by MRN, date of birth, ID band Patient awake    Reviewed: Allergy & Precautions, NPO status , Patient's Chart, lab work & pertinent test results  Airway Mallampati: II  TM Distance: >3 FB Neck ROM: Full    Dental  (+) Partial Lower, Upper Dentures   Pulmonary neg pulmonary ROS,    Pulmonary exam normal breath sounds clear to auscultation       Cardiovascular hypertension, Pt. on medications Normal cardiovascular exam+ dysrhythmias Atrial Fibrillation + pacemaker (SSS)  Rhythm:Regular Rate:Normal     Neuro/Psych PSYCHIATRIC DISORDERS Depression negative neurological ROS     GI/Hepatic Neg liver ROS, GERD  Medicated,  Endo/Other  Obesity   Renal/GU negative Renal ROS     Musculoskeletal  (+) Arthritis  (Xarelto), Osteoarthritis,    Abdominal   Peds  Hematology  (+) Blood dyscrasia (Xarelto), , Plt 180k   Anesthesia Other Findings Day of surgery medications reviewed with the patient.  Reproductive/Obstetrics                            Anesthesia Physical Anesthesia Plan  ASA: III  Anesthesia Plan: Spinal   Post-op Pain Management:  Regional for Post-op pain   Induction: Intravenous  PONV Risk Score and Plan: 1 and Propofol infusion and Treatment may vary due to age or medical condition  Airway Management Planned: Natural Airway and Nasal Cannula  Additional Equipment:   Intra-op Plan:   Post-operative Plan:   Informed Consent: I have reviewed the patients History and Physical, chart, labs and discussed the procedure including the risks, benefits and alternatives for the proposed anesthesia with the patient or authorized representative who has indicated his/her understanding and acceptance.       Plan Discussed with: CRNA  Anesthesia Plan Comments:         Anesthesia Quick Evaluation

## 2020-03-16 NOTE — Anesthesia Postprocedure Evaluation (Signed)
Anesthesia Post Note  Patient: George Wolf  Procedure(s) Performed: TOTAL KNEE ARTHROPLASTY (Left Knee)     Patient location during evaluation: PACU Anesthesia Type: Spinal Level of consciousness: oriented, awake and alert and awake Pain management: pain level controlled Vital Signs Assessment: post-procedure vital signs reviewed and stable Respiratory status: spontaneous breathing, respiratory function stable and nonlabored ventilation Cardiovascular status: blood pressure returned to baseline and stable Postop Assessment: no headache, no backache, no apparent nausea or vomiting, patient able to bend at knees and spinal receding Anesthetic complications: no   No complications documented.  Last Vitals:  Vitals:   03/16/20 1330 03/16/20 1525  BP: (!) 144/83 138/80  Pulse: 60 60  Resp:  18  Temp: (!) 36.3 C   SpO2: 100% 99%    Last Pain:  Vitals:   03/16/20 1330  TempSrc: Oral  PainSc:                  Catalina Gravel

## 2020-03-17 ENCOUNTER — Encounter (HOSPITAL_COMMUNITY): Payer: Self-pay | Admitting: Orthopedic Surgery

## 2020-03-17 DIAGNOSIS — M1712 Unilateral primary osteoarthritis, left knee: Secondary | ICD-10-CM | POA: Diagnosis not present

## 2020-03-17 LAB — CBC
HCT: 33.6 % — ABNORMAL LOW (ref 39.0–52.0)
Hemoglobin: 11.3 g/dL — ABNORMAL LOW (ref 13.0–17.0)
MCH: 32.2 pg (ref 26.0–34.0)
MCHC: 33.6 g/dL (ref 30.0–36.0)
MCV: 95.7 fL (ref 80.0–100.0)
Platelets: 144 10*3/uL — ABNORMAL LOW (ref 150–400)
RBC: 3.51 MIL/uL — ABNORMAL LOW (ref 4.22–5.81)
RDW: 12.8 % (ref 11.5–15.5)
WBC: 11.8 10*3/uL — ABNORMAL HIGH (ref 4.0–10.5)
nRBC: 0 % (ref 0.0–0.2)

## 2020-03-17 LAB — BASIC METABOLIC PANEL
Anion gap: 10 (ref 5–15)
BUN: 20 mg/dL (ref 8–23)
CO2: 27 mmol/L (ref 22–32)
Calcium: 8.6 mg/dL — ABNORMAL LOW (ref 8.9–10.3)
Chloride: 100 mmol/L (ref 98–111)
Creatinine, Ser: 0.99 mg/dL (ref 0.61–1.24)
GFR, Estimated: >60 mL/min (ref >60)
Glucose, Bld: 133 mg/dL — ABNORMAL HIGH (ref 70–99)
Potassium: 4 mmol/L (ref 3.5–5.1)
Sodium: 137 mmol/L (ref 135–145)

## 2020-03-17 MED ORDER — GABAPENTIN 100 MG PO CAPS
ORAL_CAPSULE | ORAL | 0 refills | Status: DC
Start: 2020-03-17 — End: 2021-01-13

## 2020-03-17 MED ORDER — METHOCARBAMOL 500 MG PO TABS: 500.0000 mg | ORAL_TABLET | Freq: Four times a day (QID) | ORAL | 0 refills | Status: DC | PRN

## 2020-03-17 MED ORDER — OXYCODONE HCL 5 MG PO TABS
5.0000 mg | ORAL_TABLET | Freq: Four times a day (QID) | ORAL | 0 refills | Status: DC | PRN
Start: 2020-03-17 — End: 2021-01-13

## 2020-03-17 NOTE — Progress Notes (Signed)
   Subjective: 1 Day Post-Op Procedure(s) (LRB): TOTAL KNEE ARTHROPLASTY (Left) Patient reports pain as mild.   Patient seen in rounds by Dr. Wynelle Link. Patient is well, and has had no acute complaints or problems. No issues overnight. Denies chest pain, SOB, or palpitations. Foley catheter to be removed this AM. States he is ready to go home. We will continue therapy today, ambulated 34' yesterday.  Objective: Vital signs in last 24 hours: Temp:  [97.4 F (36.3 C)-98 F (36.7 C)] 97.8 F (36.6 C) (10/19 0500) Pulse Rate:  [59-68] 61 (10/19 0500) Resp:  [9-22] 17 (10/19 0500) BP: (106-168)/(67-93) 130/76 (10/19 0500) SpO2:  [94 %-100 %] 98 % (10/19 0500) Weight:  [105.3 kg] 105.3 kg (10/18 0840)  Intake/Output from previous day:  Intake/Output Summary (Last 24 hours) at 03/17/2020 0703 Last data filed at 03/17/2020 0620 Gross per 24 hour  Intake 3790 ml  Output 1925 ml  Net 1865 ml     Intake/Output this shift: No intake/output data recorded.  Labs: Recent Labs    03/17/20 0335  HGB 11.3*   Recent Labs    03/17/20 0335  WBC 11.8*  RBC 3.51*  HCT 33.6*  PLT 144*   Recent Labs    03/17/20 0335  NA 137  K 4.0  CL 100  CO2 27  BUN 20  CREATININE 0.99  GLUCOSE 133*  CALCIUM 8.6*   No results for input(s): LABPT, INR in the last 72 hours.  Exam: General - Patient is Alert and Oriented Extremity - Neurologically intact Neurovascular intact Sensation intact distally Dorsiflexion/Plantar flexion intact Dressing - dressing C/D/I Motor Function - intact, moving foot and toes well on exam.   Past Medical History:  Diagnosis Date  . AF (paroxysmal atrial fibrillation) ([place])   . Arthritis   . Cancer [facility])    SKIN CANCER  . Depression   . Hypertension   . Pacemaker 11/2011   [employer] for [employer]  . Sinus node dysfunction [facility])    [employer] pacemaker 12/05/11    Assessment/Plan: 1 Day Post-Op Procedure(s) (LRB): TOTAL KNEE ARTHROPLASTY  (Left) Active Problems:   Primary osteoarthritis of left knee  Estimated body mass index is 31.49 kg/m as calculated from the following:   Height as of this encounter: 6' (1.829 m).   Weight as of this encounter: 105.3 kg. Advance diet Up with therapy D/C IV fluids   Patient's anticipated LOS is less than 2 midnights, meeting these requirements: - Younger than 47 - Lives within 1 hour of care - Has a competent adult at home to recover with post-op recover - NO history of  - Chronic pain requiring opiods  - Diabetes  - Coronary Artery Disease  - Heart failure  - Heart attack  - Stroke  - DVT/VTE  - Respiratory Failure/COPD  - Renal failure  - Anemia  - Advanced Liver disease  DVT Prophylaxis - Xarelto Weight bearing as tolerated. Continue therapy.  Plan is to go Home after hospital stay. Plan for discharge after one session of PT if meeting goals. Scheduled for OPPT at [place] in the office in 2 weeks.   The PDMP database was reviewed today (03/17/2020) prior to any opioid medications being prescribed to this patient.   Theresa Duty, PA-C Orthopedic Surgery [phone] 03/17/2020, 7:03 AM

## 2020-03-17 NOTE — Progress Notes (Signed)
Physical Therapy Treatment Patient Details Name: George Wolf MRN: [id number] DOB: 1945/12/25 Today's Date: 03/17/2020    History of Present Illness 74 yo male s/p L TKA 03/16/20. Hx of A fib, SSS, pacemaker, NSVT, R TKA (remote)    PT Comments    Pt ambulated in hallway and practiced safe stair technique.  Pt also performed a couple LE exercises.  Pt plans to f/u with OPPT.  Pt feels ready for d/c home today.    Follow Up Recommendations  Follow surgeon's recommendation for DC plan and follow-up therapies     Equipment Recommendations  None recommended by PT    Recommendations for Other Services       Precautions / Restrictions Precautions Precautions: Fall;Knee Restrictions Other Position/Activity Restrictions: WBAT    Mobility  Bed Mobility               General bed mobility comments: pt up in recliner  Transfers Overall transfer level: Needs assistance Equipment used: Rolling walker (2 wheeled) Transfers: Sit to/from Stand Sit to Stand: Min guard         General transfer comment: pt able to recall safe technique  Ambulation/Gait Ambulation/Gait assistance: Min guard Gait Distance (Feet): 200 Feet Assistive device: Rolling walker (2 wheeled) Gait Pattern/deviations: Step-to pattern;Decreased stance time - left;Antalgic     General Gait Details: verbal cues for RW Positioning, step length, posture   Stairs Stairs: Yes Stairs assistance: Min guard Stair Management: Forwards;Step to pattern Number of Stairs: 2 General stair comments: pt performed once with 2 rails and then again with only left rail (only left rail to upstairs); pt reports understanding   Wheelchair Mobility    Modified Rankin (Stroke Patients Only)       Balance                                            Cognition Arousal/Alertness: Awake/alert Behavior During Therapy: WFL for tasks assessed/performed Overall Cognitive Status: Within Functional  Limits for tasks assessed                                        Exercises Total Joint Exercises Ankle Circles/Pumps: AROM;Both;5 reps Quad Sets: AROM;Left;5 reps Heel Slides: AAROM;Left;5 reps    General Comments        Pertinent Vitals/Pain Pain Assessment: 0-10 Pain Score: 4  Pain Location: L knee Pain Descriptors / Indicators: Aching;Sore Pain Intervention(s): Repositioned;Monitored during session    Home Living                      Prior Function            PT Goals (current goals can now be found in the care plan section) Progress towards PT goals: Progressing toward goals    Frequency    7X/week      PT Plan Current plan remains appropriate    Co-evaluation              AM-PAC PT "6 Clicks" Mobility   Outcome Measure  Help needed turning from your back to your side while in a flat bed without using bedrails?: A Little Help needed moving from lying on your back to sitting on the side of a flat bed without using bedrails?: A Little Help  needed moving to and from a bed to a chair (including a wheelchair)?: A Little Help needed standing up from a chair using your arms (e.g., wheelchair or bedside chair)?: A Little Help needed to walk in hospital room?: A Little Help needed climbing 3-5 steps with a railing? : A Little 6 Click Score: 18    End of Session Equipment Utilized During Treatment: Gait belt Activity Tolerance: Patient tolerated treatment well Patient left: in chair;with call bell/phone within reach Nurse Communication: Mobility status PT Visit Diagnosis: Other abnormalities of gait and mobility (R26.89)     Time: 1290-4753 PT Time Calculation (min) (ACUTE ONLY): 16 min  Charges:  $Gait Training: 8-22 mins                     Arlyce Dice, DPT Acute Rehabilitation Services Pager: [phone] Office: [place] 03/17/2020, 12:01 PM

## 2020-03-17 NOTE — TOC Transition Note (Signed)
Transition of Care [facility]) - CM/SW Discharge Note   Patient Details  Name: George Wolf MRN: [id number] Date of Birth: Nov 22, 1945  Transition of [facility]) CM/SW Contact:  Lennart Pall, LCSW Phone Number: 03/17/2020, 9:38 AM   Clinical Narrative:    Met briefly with pt to confirm dc plans.  Pt confirms he has all needed DME.  Plan for OPPT at [facility] PT. No TOC needs.   Final next level of care: OP Rehab Barriers to Discharge: No Barriers Identified   Patient Goals and CMS Choice Patient states their goals for this hospitalization and ongoing recovery are:: return home      Discharge Placement                       Discharge Plan and Services                DME Arranged: N/A DME Agency: NA       HH Arranged: NA HH Agency: NA        Social Determinants of Health (SDOH) Interventions     Readmission Risk Interventions No flowsheet data found.

## 2020-03-17 NOTE — Plan of Care (Signed)
Patient discharged home in stable condition 

## 2020-03-17 NOTE — Progress Notes (Signed)
Orthopedic Tech Progress Note Patient Details:  George Wolf [birth year] [id number] Picked up cpm Patient ID: George Wolf, male   DOB: 1946/04/25, 74 y.o.   MRN: [id number]   [name] 03/17/2020, 12:06 PM

## 2020-03-18 NOTE — Discharge Summary (Signed)
Physician Discharge Summary   Patient ID: George Wolf MRN: [id number] DOB/AGE: 1945-10-29 74 y.o.  Admit date: 03/16/2020 Discharge date: 03/17/2020  Primary Diagnosis: Osteoarthritis, left knee   Admission Diagnoses:  Past Medical History:  Diagnosis Date  . AF (paroxysmal atrial fibrillation) ([place])   . Arthritis   . Cancer [facility])    SKIN CANCER  . Depression   . Hypertension   . Pacemaker 11/2011   [employer] for [employer]  . Sinus node dysfunction [facility])    [employer] pacemaker 12/05/11   Discharge Diagnoses:   Active Problems:   Primary osteoarthritis of left knee  Estimated body mass index is 31.49 kg/m as calculated from the following:   Height as of this encounter: 6' (1.829 m).   Weight as of this encounter: 105.3 kg.  Procedure:  Procedure(s) (LRB): TOTAL KNEE ARTHROPLASTY (Left)   Consults: None  HPI: George Wolf is a 74 y.o. year old male with end stage OA of his left knee with progressively worsening pain and dysfunction. He has constant pain, with activity and at rest and significant functional deficits with difficulties even with ADLs. He has had extensive non-op management including analgesics, injections of cortisone and viscosupplements, and home exercise program, but remains in significant pain with significant dysfunction. Radiographs show bone on bone arthritis medial and patellofemoral. He presents now for left Total Knee Arthroplasty.    Laboratory Data: Admission on 03/16/2020, Discharged on 03/17/2020  Component Date Value Ref Range Status  . WBC 03/17/2020 11.8* 4.0 - 10.5 K/uL Final  . RBC 03/17/2020 3.51* 4.22 - 5.81 MIL/uL Final  . Hemoglobin 03/17/2020 11.3* 13.0 - 17.0 g/dL Final  . HCT 03/17/2020 33.6* 39 - 52 % Final  . MCV 03/17/2020 95.7  80.0 - 100.0 fL Final  . MCH 03/17/2020 32.2  26.0 - 34.0 pg Final  . MCHC 03/17/2020 33.6  30.0 - 36.0 g/dL Final  . RDW 03/17/2020 12.8  11.5 - 15.5 % Final  . Platelets 03/17/2020  144* 150 - 400 K/uL Final  . nRBC 03/17/2020 0.0  0.0 - 0.2 % Final   Performed at [facility], [place] [address] [place], [place] [address]  . Sodium 03/17/2020 137  135 - 145 mmol/L Final  . Potassium 03/17/2020 4.0  3.5 - 5.1 mmol/L Final  . Chloride 03/17/2020 100  98 - 111 mmol/L Final  . CO2 03/17/2020 27  22 - 32 mmol/L Final  . Glucose, Bld 03/17/2020 133* 70 - 99 mg/dL Final   Glucose reference range applies only to samples taken after fasting for at least 8 hours.  . BUN 03/17/2020 20  8 - 23 mg/dL Final  . Creatinine, Ser 03/17/2020 0.99  0.61 - 1.24 mg/dL Final  . Calcium 03/17/2020 8.6* 8.9 - 10.3 mg/dL Final  . GFR, Estimated 03/17/2020 >60  >60 mL/min Final  . Anion gap 03/17/2020 10  5 - 15 Final   Performed at [facility], [place] [address], [place], [place] [address]  Hospital Outpatient Visit on 03/12/2020  Component Date Value Ref Range Status  . SARS Coronavirus 2 03/12/2020 NEGATIVE  NEGATIVE Final   Comment: (NOTE) SARS-CoV-2 target nucleic acids are NOT DETECTED.  The SARS-CoV-2 RNA is generally detectable in upper and lower respiratory specimens during the acute phase of infection. Negative results do not preclude SARS-CoV-2 infection, do not rule out co-infections with other pathogens, and should not be used as the sole basis for treatment or other patient  management decisions. Negative results must be combined with clinical observations, patient history, and epidemiological information. The expected result is Negative.  Fact Sheet for Patients: SugarRoll.be  Fact Sheet for Healthcare Providers: https://www.woods-mathews.com/  This test is not yet approved or cleared by the [place] FDA and  has been authorized for detection and/or diagnosis of SARS-CoV-2 by FDA under an Emergency Use Authorization (EUA). This EUA will remain  in effect (meaning this test can be used) for  the duration of the COVID-19 declaration under Se                          ction 564(b)(1) of the Act, 21 U.S.C. section 360bbb-3(b)(1), unless the authorization is terminated or revoked sooner.  Performed at [facility], [place] [address], [place], [place] [address]   Hospital Outpatient Visit on 03/11/2020  Component Date Value Ref Range Status  . MRSA, PCR 03/11/2020 NEGATIVE  NEGATIVE Final  . Staphylococcus aureus 03/11/2020 NEGATIVE  NEGATIVE Final   Comment: (NOTE) The Xpert SA Assay (FDA approved for NASAL specimens in patients 42 years of age and older), is one component of a comprehensive surveillance program. It is not intended to diagnose infection nor to guide or monitor treatment. Performed at [facility], [place] [address] [place], [place] [address]   . aPTT 03/11/2020 35  24 - 36 seconds Final   Performed at [facility], [place] [address], [place], [place] [address]  . WBC 03/11/2020 6.3  4.0 - 10.5 K/uL Final  . RBC 03/11/2020 4.10* 4.22 - 5.81 MIL/uL Final  . Hemoglobin 03/11/2020 13.1  13.0 - 17.0 g/dL Final  . HCT 03/11/2020 39.3  39 - 52 % Final  . MCV 03/11/2020 95.9  80.0 - 100.0 fL Final  . MCH 03/11/2020 32.0  26.0 - 34.0 pg Final  . MCHC 03/11/2020 33.3  30.0 - 36.0 g/dL Final  . RDW 03/11/2020 13.3  11.5 - 15.5 % Final  . Platelets 03/11/2020 180  150 - 400 K/uL Final  . nRBC 03/11/2020 0.0  0.0 - 0.2 % Final   Performed at [facility], [place] [address], [place], [place] [address]  . Sodium 03/11/2020 140  135 - 145 mmol/L Final  . Potassium 03/11/2020 3.8  3.5 - 5.1 mmol/L Final  . Chloride 03/11/2020 102  98 - 111 mmol/L Final  . CO2 03/11/2020 27  22 - 32 mmol/L Final  . Glucose, Bld 03/11/2020 115* 70 - 99 mg/dL Final   Glucose reference range applies only to samples taken after fasting for at least 8 hours.  . BUN 03/11/2020 35* 8 - 23 mg/dL Final  . Creatinine, Ser 03/11/2020 0.86  0.61  - 1.24 mg/dL Final  . Calcium 03/11/2020 8.9  8.9 - 10.3 mg/dL Final  . Total Protein 03/11/2020 7.3  6.5 - 8.1 g/dL Final  . Albumin 03/11/2020 4.2  3.5 - 5.0 g/dL Final  . AST 03/11/2020 24  15 - 41 U/L Final  . ALT 03/11/2020 19  0 - 44 U/L Final  . Alkaline Phosphatase 03/11/2020 52  38 - 126 U/L Final  . Total Bilirubin 03/11/2020 0.7  0.3 - 1.2 mg/dL Final  . GFR, Estimated 03/11/2020 >60  >60 mL/min Final  . Anion gap 03/11/2020 11  5 - 15 Final   Performed at [facility], [place] [address], [place], [place] [address]  . Prothrombin Time 03/11/2020 13.6  11.4 - 15.2 seconds Final  . INR 03/11/2020 1.1  0.8 - 1.2 Final   Comment: (NOTE) INR goal varies based on device and disease states. Performed at [facility], [place] [address], [place], [place] [address]   . ABO/RH(D) 03/11/2020 B POS   Final  . Antibody Screen 03/11/2020 NEG   Final  . Sample Expiration 03/11/2020 03/19/2020,2359   Final  . Extend sample reason 03/11/2020    Final                   Value:NO TRANSFUSIONS OR PREGNANCY IN THE PAST 3 MONTHS Performed at [facility], [place] [address], [place],  [address]   Clinical Support on 01/29/2020  Component Date Value Ref Range Status  . Date Time Interrogation Session 01/29/2020 [id number]   Final  . Pulse Generator Manufacturer 01/29/2020 BOST   Final  . Pulse Gen Model 01/29/2020 K064 ADVANTIO   Final  . Pulse Gen Serial Number 01/29/2020 [id number]   Final  . Clinic Name 01/29/2020 [facility]   Final  . Implantable Pulse Generator Type 01/29/2020 Implantable Pulse Generator   Final  . Implantable Pulse Generator Implan* 01/29/2020 [id number]   Final  . Implantable Lead Manufacturer 01/29/2020 GUIC   Final  . Implantable Lead Model 01/29/2020 4136 Dextrus   Final  . Implantable Lead Serial Number 01/29/2020 [id number]   Final  . Implantable Lead Implant Date 01/29/2020 [id number]   Final  . Implantable Lead  Location 01/29/2020 [id number]   Final  . Implantable Lead Manufacturer 01/29/2020 GUIC   Final  . Implantable Lead Model 01/29/2020 4137 Dextrus   Final  . Implantable Lead Serial Number 01/29/2020 [id number]   Final  . Implantable Lead Implant Date 01/29/2020 [id number]   Final  . Implantable Lead Location 01/29/2020 [id number]   Final  . Lead Channel Setting Sensing Sensi* 01/29/2020 2.5  mV Final  . Lead Channel Setting Sensing Adapt* 01/29/2020 Fixed Pacing   Final  . Lead Channel Setting Pacing Amplit* 01/29/2020 2.0  V Final  . Lead Channel Setting Pacing Pulse * 01/29/2020 0.6  ms Final  . Lead Channel Setting Pacing Amplit* 01/29/2020 2.4  V Final  . Lead Channel Impedance Value 01/29/2020 692  ohm Final  . Lead Channel Pacing Threshold Ampl* 01/29/2020 0.5  V Final  . Lead Channel Pacing Threshold Puls* 01/29/2020 0.4  ms Final  . Lead Channel Impedance Value 01/29/2020 562  ohm Final  . Battery Status 01/29/2020 BOS   Final  . Battery Remaining Longevity 01/29/2020 60  mo Final  . Battery Remaining Percentage 01/29/2020 63  % Final  . Brady Statistic RA Percent Paced 01/29/2020 96  % Final  . Brady Statistic RV Percent Paced 01/29/2020 6  % Final     X-Rays:No results found.  EKG: Orders placed or performed in visit on 12/13/19  . EKG 12-Lead     Hospital Course: George Wolf is a 74 y.o. who was admitted to [facility]. They were brought to the operating room on 03/16/2020 and underwent Procedure(s): TOTAL KNEE ARTHROPLASTY.  Patient tolerated the procedure well and was later transferred to the recovery room and then to the orthopaedic floor for postoperative care. They were given PO and IV analgesics for pain control following their surgery. They were given 24 hours of postoperative antibiotics of  Anti-infectives (From admission, onward)   Start     Dose/Rate Route Frequency Ordered Stop   03/16/20 1630  ceFAZolin (ANCEF) IVPB 2g/100 mL premix        2 g 200 mL/hr  over 30 Minutes Intravenous Every 6 hours 03/16/20 1127 03/16/20 2217   03/16/20 0830  ceFAZolin (ANCEF) IVPB 2g/100 mL premix        2 g 200 mL/hr over 30 Minutes Intravenous On call to O.R. 03/16/20 4967 03/16/20 1032     and started on DVT prophylaxis in the form of Xarelto.   PT and OT were ordered for total joint protocol. Discharge planning consulted to help with postop disposition and equipment needs.  Patient had a good night on the evening of surgery. They started to get up OOB with therapy on POD #0. Pt was seen during rounds and was ready to go home pending progress with therapy. He worked with therapy on POD #1 and was meeting his goals. Pt was discharged to home later that day in stable condition.  Diet: Cardiac diet Activity: WBAT Follow-up: in 2 weeks Disposition: Home with OPPT at [facility] Discharged Condition: stable   Discharge Instructions    Call MD / Call 911   Complete by: As directed    If you experience chest pain or shortness of breath, CALL 911 and be transported to the hospital emergency room.  If you develope a fever above 101 F, pus (white drainage) or increased drainage or redness at the wound, or calf pain, call your surgeon's office.   Change dressing   Complete by: As directed    You may remove the bulky bandage (ACE wrap and gauze) two days after surgery. You will have an adhesive waterproof bandage underneath. Leave this in place until your first follow-up appointment.   Constipation Prevention   Complete by: As directed    Drink plenty of fluids.  Prune juice may be helpful.  You may use a stool softener, such as Colace (over the counter) 100 mg twice a day.  Use MiraLax (over the counter) for constipation as needed.   Diet - low sodium heart healthy   Complete by: As directed    Do not put a pillow under the knee. Place it under the heel.   Complete by: As directed    Driving restrictions   Complete by: As directed    No driving for two weeks    TED hose   Complete by: As directed    Use stockings (TED hose) for three weeks on both leg(s).  You may remove them at night for sleeping.   Weight bearing as tolerated   Complete by: As directed      Allergies as of 03/17/2020      Reactions   Penicillins Hives   Has patient had a PCN reaction causing immediate rash, facial/tongue/throat swelling, SOB or lightheadedness with hypotension: Unknown Has patient had a PCN reaction causing severe rash involving mucus membranes or skin necrosis: Unknown Has patient had a PCN reaction that required hospitalization: No Has patient had a PCN reaction occurring within the last 10 years: No If all of the above answers are "NO", then may proceed with Cephalosporin use. Tolerated Cephalosporin Date: 03/17/20.      Medication List    STOP taking these medications   ibuprofen 200 MG tablet Commonly known as: ADVIL   Magnesium Oxide 500 MG Caps   multivitamin with minerals Tabs tablet     TAKE these medications   allopurinol 100 MG tablet Commonly known as: ZYLOPRIM Take 100 mg by mouth daily.   atorvastatin  20 MG tablet Commonly known as: LIPITOR Take 20 mg by mouth every evening.   benazepril 20 MG tablet Commonly known as: LOTENSIN Take 20 mg by mouth daily.   buPROPion 150 MG 12 hr tablet Commonly known as: WELLBUTRIN SR Take 150 mg by mouth 2 (two) times daily.   clindamycin 300 MG capsule Commonly known as: CLEOCIN Take 600 mg by mouth See admin instructions. Take 600 mg by mouth prior to dental procedures   clonazePAM 0.5 MG tablet Commonly known as: KLONOPIN Take 0.5 mg by mouth daily as needed for anxiety.   fexofenadine 180 MG tablet Commonly known as: ALLEGRA Take 180 mg by mouth daily as needed for allergies.   fluticasone 50 MCG/ACT nasal spray Commonly known as: FLONASE Place 1 spray into both nostrils daily as needed for allergies.   gabapentin 100 MG capsule Commonly known as: NEURONTIN Take a 100  mg capsule three times a day for two weeks following surgery.Then take a 100 mg capsule two times a day for two weeks. Then take a 100 mg capsule once a day for two weeks. Then discontinue. Notes to patient: Do not stop using this medicine  May make you dizzy or drowsy. Do not drive or do anything that could be dangerous until you know how this medicine affects you. suddenly.   hydrochlorothiazide 25 MG tablet Commonly known as: HYDRODIURIL Take 25 mg by mouth daily.   methocarbamol 500 MG tablet Commonly known as: ROBAXIN Take 1 tablet (500 mg total) by mouth every 6 (six) hours as needed for muscle spasms.   oxyCODONE 5 MG immediate release tablet Commonly known as: Oxy IR/ROXICODONE Take 1-2 tablets (5-10 mg total) by mouth every 6 (six) hours as needed for moderate pain or severe pain. Notes to patient: May cause constipation.   May make you dizzy or drowsy. Do not drive or do anything that could be dangerous until you know how this medicine affects you.    pantoprazole 40 MG tablet Commonly known as: PROTONIX Take 40 mg by mouth daily.   rivaroxaban 20 MG Tabs tablet Commonly known as: Xarelto TAKE 1 TABLET(20 MG) BY MOUTH DAILY WITH DINNER What changed:   how much to take  how to take this  additional instructions   sertraline 100 MG tablet Commonly known as: ZOLOFT Take 100 mg by mouth daily.            Discharge Care Instructions  (From admission, onward)         Start     Ordered   03/17/20 0000  Weight bearing as tolerated        03/17/20 0708   03/17/20 0000  Change dressing       Comments: You may remove the bulky bandage (ACE wrap and gauze) two days after surgery. You will have an adhesive waterproof bandage underneath. Leave this in place until your first follow-up appointment.   03/17/20 0708          Follow-up Information    Gaynelle Arabian, MD. Schedule an appointment as soon as possible for a visit in 2 week(s).   Specialty: Orthopedic  Surgery Contact information: [address] [place] [address] [phone]               Signed: Theresa Duty, PA-C Orthopedic Surgery 03/18/2020, 10:48 AM

## 2020-03-19 ENCOUNTER — Telehealth: Payer: Self-pay | Admitting: Cardiovascular Disease

## 2020-03-19 NOTE — Telephone Encounter (Signed)
Patient calling the office for samples of medication:   1.  What medication and dosage are you requesting samples for? rivaroxaban (XARELTO) 20 MG TABS tablet  2.  Are you currently out of this medication? Yes   

## 2020-03-20 ENCOUNTER — Other Ambulatory Visit: Payer: Self-pay

## 2020-03-20 ENCOUNTER — Telehealth: Payer: Self-pay | Admitting: Cardiovascular Disease

## 2020-03-20 DIAGNOSIS — R269 Unspecified abnormalities of gait and mobility: Secondary | ICD-10-CM | POA: Diagnosis not present

## 2020-03-20 DIAGNOSIS — M25562 Pain in left knee: Secondary | ICD-10-CM | POA: Diagnosis not present

## 2020-03-20 DIAGNOSIS — M1712 Unilateral primary osteoarthritis, left knee: Secondary | ICD-10-CM | POA: Diagnosis not present

## 2020-03-20 MED ORDER — RIVAROXABAN 20 MG PO TABS
ORAL_TABLET | ORAL | 3 refills | Status: DC
Start: 2020-03-20 — End: 2022-08-18

## 2020-03-20 NOTE — Telephone Encounter (Signed)
Returned the call to the patient. He has been advised that the use of a TENS unit is fine as long as it is around the leg area.  He was also calling for Xarelto 20 mg tablet samples. He has been advised that we do not have any at the moment.

## 2020-03-20 NOTE — Telephone Encounter (Signed)
Patient states he recently had a total knee replacement and he just completed a physical therapy session. He states his PT recommended a tens unit for patient's knee and he would like to ensure that this will not cause any issues or interfere with his pacemaker. Please advise.

## 2020-03-24 ENCOUNTER — Telehealth: Payer: Self-pay | Admitting: Cardiovascular Disease

## 2020-03-24 NOTE — Telephone Encounter (Signed)
Pt called in stated that he just had Knee replacement surgery on 03/16/2020.  He stated he still has some bleeding and would like to know if Dr Sallyanne Kuster would like him to stay off of the rivaroxaban (XARELTO) 20 MG TABS tablet [[id number]] A little longer ?    Best number [phone]

## 2020-03-24 NOTE — Telephone Encounter (Signed)
Is he walking? If so, we do not need to worry about DVT. Risk from AFib is low. If walking, can hold the xarelto for 3 days, allow bleeding to stop, then restart it. If still relatively immobile after surgery, I worry about DVT risk.

## 2020-03-24 NOTE — Telephone Encounter (Signed)
Returned the call to the patient. He had knee replacement on the 18th. His Xarelto was held four days prior. He was restarted on the 19th and has had some bleeding that has required once daily bandage changing. He would like to know if he can hold the Xarelto or should he continue it.

## 2020-03-26 NOTE — Telephone Encounter (Signed)
Returned the call to the patient. He stated that the bleeding is getting better. He has an appointment tomorrow for physical therapy and will reevaluate then. He will call back if anything changes.

## 2020-03-27 ENCOUNTER — Other Ambulatory Visit: Payer: Self-pay | Admitting: Cardiovascular Disease

## 2020-03-27 DIAGNOSIS — M25562 Pain in left knee: Secondary | ICD-10-CM | POA: Diagnosis not present

## 2020-03-27 DIAGNOSIS — M1712 Unilateral primary osteoarthritis, left knee: Secondary | ICD-10-CM | POA: Diagnosis not present

## 2020-03-27 DIAGNOSIS — R231 Pallor: Secondary | ICD-10-CM | POA: Diagnosis not present

## 2020-03-27 DIAGNOSIS — R269 Unspecified abnormalities of gait and mobility: Secondary | ICD-10-CM | POA: Diagnosis not present

## 2020-03-27 DIAGNOSIS — R5383 Other fatigue: Secondary | ICD-10-CM | POA: Diagnosis not present

## 2020-03-30 DIAGNOSIS — R269 Unspecified abnormalities of gait and mobility: Secondary | ICD-10-CM | POA: Diagnosis not present

## 2020-03-30 DIAGNOSIS — M1712 Unilateral primary osteoarthritis, left knee: Secondary | ICD-10-CM | POA: Diagnosis not present

## 2020-03-30 DIAGNOSIS — M25562 Pain in left knee: Secondary | ICD-10-CM | POA: Diagnosis not present

## 2020-04-01 DIAGNOSIS — M1712 Unilateral primary osteoarthritis, left knee: Secondary | ICD-10-CM | POA: Diagnosis not present

## 2020-04-01 DIAGNOSIS — R269 Unspecified abnormalities of gait and mobility: Secondary | ICD-10-CM | POA: Diagnosis not present

## 2020-04-01 DIAGNOSIS — M25562 Pain in left knee: Secondary | ICD-10-CM | POA: Diagnosis not present

## 2020-04-06 DIAGNOSIS — M1712 Unilateral primary osteoarthritis, left knee: Secondary | ICD-10-CM | POA: Diagnosis not present

## 2020-04-06 DIAGNOSIS — R269 Unspecified abnormalities of gait and mobility: Secondary | ICD-10-CM | POA: Diagnosis not present

## 2020-04-06 DIAGNOSIS — M25562 Pain in left knee: Secondary | ICD-10-CM | POA: Diagnosis not present

## 2020-04-08 DIAGNOSIS — R269 Unspecified abnormalities of gait and mobility: Secondary | ICD-10-CM | POA: Diagnosis not present

## 2020-04-08 DIAGNOSIS — M25562 Pain in left knee: Secondary | ICD-10-CM | POA: Diagnosis not present

## 2020-04-08 DIAGNOSIS — M1712 Unilateral primary osteoarthritis, left knee: Secondary | ICD-10-CM | POA: Diagnosis not present

## 2020-04-13 DIAGNOSIS — R269 Unspecified abnormalities of gait and mobility: Secondary | ICD-10-CM | POA: Diagnosis not present

## 2020-04-13 DIAGNOSIS — M1712 Unilateral primary osteoarthritis, left knee: Secondary | ICD-10-CM | POA: Diagnosis not present

## 2020-04-13 DIAGNOSIS — M25562 Pain in left knee: Secondary | ICD-10-CM | POA: Diagnosis not present

## 2020-04-15 DIAGNOSIS — M25562 Pain in left knee: Secondary | ICD-10-CM | POA: Diagnosis not present

## 2020-04-15 DIAGNOSIS — M1712 Unilateral primary osteoarthritis, left knee: Secondary | ICD-10-CM | POA: Diagnosis not present

## 2020-04-15 DIAGNOSIS — R269 Unspecified abnormalities of gait and mobility: Secondary | ICD-10-CM | POA: Diagnosis not present

## 2020-04-20 DIAGNOSIS — M1712 Unilateral primary osteoarthritis, left knee: Secondary | ICD-10-CM | POA: Diagnosis not present

## 2020-04-20 DIAGNOSIS — M25562 Pain in left knee: Secondary | ICD-10-CM | POA: Diagnosis not present

## 2020-04-20 DIAGNOSIS — R269 Unspecified abnormalities of gait and mobility: Secondary | ICD-10-CM | POA: Diagnosis not present

## 2020-04-22 DIAGNOSIS — M1712 Unilateral primary osteoarthritis, left knee: Secondary | ICD-10-CM | POA: Diagnosis not present

## 2020-04-22 DIAGNOSIS — M25562 Pain in left knee: Secondary | ICD-10-CM | POA: Diagnosis not present

## 2020-04-22 DIAGNOSIS — R269 Unspecified abnormalities of gait and mobility: Secondary | ICD-10-CM | POA: Diagnosis not present

## 2020-04-27 DIAGNOSIS — M25562 Pain in left knee: Secondary | ICD-10-CM | POA: Diagnosis not present

## 2020-04-27 DIAGNOSIS — R269 Unspecified abnormalities of gait and mobility: Secondary | ICD-10-CM | POA: Diagnosis not present

## 2020-04-27 DIAGNOSIS — M1712 Unilateral primary osteoarthritis, left knee: Secondary | ICD-10-CM | POA: Diagnosis not present

## 2020-04-28 DIAGNOSIS — Z471 Aftercare following joint replacement surgery: Secondary | ICD-10-CM | POA: Diagnosis not present

## 2020-04-28 DIAGNOSIS — Z96652 Presence of left artificial knee joint: Secondary | ICD-10-CM | POA: Diagnosis not present

## 2020-04-29 ENCOUNTER — Ambulatory Visit (INDEPENDENT_AMBULATORY_CARE_PROVIDER_SITE_OTHER): Payer: PPO

## 2020-04-29 DIAGNOSIS — I495 Sick sinus syndrome: Secondary | ICD-10-CM | POA: Diagnosis not present

## 2020-04-29 LAB — CUP PACEART REMOTE DEVICE CHECK
Battery Remaining Longevity: 54 mo
Battery Remaining Percentage: 58 %
Brady Statistic RA Percent Paced: 91 %
Brady Statistic RV Percent Paced: 5 %
Date Time Interrogation Session: 20211201173800
Implantable Lead Implant Date: 20130708
Implantable Lead Implant Date: 20130708
Implantable Lead Location: 753859
Implantable Lead Location: 753860
Implantable Lead Model: 4136
Implantable Lead Model: 4137
Implantable Lead Serial Number: 29021711
Implantable Lead Serial Number: 29201221
Implantable Pulse Generator Implant Date: 20130708
Lead Channel Impedance Value: 583 Ohm
Lead Channel Impedance Value: 632 Ohm
Lead Channel Pacing Threshold Amplitude: 0.5 V
Lead Channel Pacing Threshold Pulse Width: 0.4 ms
Lead Channel Setting Pacing Amplitude: 2 V
Lead Channel Setting Pacing Amplitude: 2.4 V
Lead Channel Setting Pacing Pulse Width: 0.6 ms
Lead Channel Setting Sensing Sensitivity: 2.5 mV
Pulse Gen Serial Number: 112155

## 2020-05-05 NOTE — Progress Notes (Signed)
Remote pacemaker transmission.   

## 2020-05-12 DIAGNOSIS — H2181 Floppy iris syndrome: Secondary | ICD-10-CM | POA: Diagnosis not present

## 2020-05-12 DIAGNOSIS — H5703 Miosis: Secondary | ICD-10-CM | POA: Diagnosis not present

## 2020-05-12 DIAGNOSIS — H43813 Vitreous degeneration, bilateral: Secondary | ICD-10-CM | POA: Diagnosis not present

## 2020-05-12 DIAGNOSIS — Z961 Presence of intraocular lens: Secondary | ICD-10-CM | POA: Diagnosis not present

## 2020-05-13 DIAGNOSIS — R7303 Prediabetes: Secondary | ICD-10-CM | POA: Diagnosis not present

## 2020-05-13 DIAGNOSIS — E782 Mixed hyperlipidemia: Secondary | ICD-10-CM | POA: Diagnosis not present

## 2020-05-13 DIAGNOSIS — I480 Paroxysmal atrial fibrillation: Secondary | ICD-10-CM | POA: Diagnosis not present

## 2020-05-13 DIAGNOSIS — Z0000 Encounter for general adult medical examination without abnormal findings: Secondary | ICD-10-CM | POA: Diagnosis not present

## 2020-05-13 DIAGNOSIS — I10 Essential (primary) hypertension: Secondary | ICD-10-CM | POA: Diagnosis not present

## 2020-05-13 DIAGNOSIS — Z23 Encounter for immunization: Secondary | ICD-10-CM | POA: Diagnosis not present

## 2020-05-13 DIAGNOSIS — F320 Major depressive disorder, single episode, mild: Secondary | ICD-10-CM | POA: Diagnosis not present

## 2020-05-14 DIAGNOSIS — R7303 Prediabetes: Secondary | ICD-10-CM | POA: Diagnosis not present

## 2020-05-14 DIAGNOSIS — E782 Mixed hyperlipidemia: Secondary | ICD-10-CM | POA: Diagnosis not present

## 2020-05-14 DIAGNOSIS — I10 Essential (primary) hypertension: Secondary | ICD-10-CM | POA: Diagnosis not present

## 2020-05-14 DIAGNOSIS — I480 Paroxysmal atrial fibrillation: Secondary | ICD-10-CM | POA: Diagnosis not present

## 2020-05-20 DIAGNOSIS — F320 Major depressive disorder, single episode, mild: Secondary | ICD-10-CM | POA: Diagnosis not present

## 2020-05-20 DIAGNOSIS — I495 Sick sinus syndrome: Secondary | ICD-10-CM | POA: Diagnosis not present

## 2020-05-20 DIAGNOSIS — M150 Primary generalized (osteo)arthritis: Secondary | ICD-10-CM | POA: Diagnosis not present

## 2020-05-20 DIAGNOSIS — K7689 Other specified diseases of liver: Secondary | ICD-10-CM | POA: Diagnosis not present

## 2020-05-20 DIAGNOSIS — M1A0791 Idiopathic chronic gout, unspecified ankle and foot, with tophus (tophi): Secondary | ICD-10-CM | POA: Diagnosis not present

## 2020-05-20 DIAGNOSIS — R7303 Prediabetes: Secondary | ICD-10-CM | POA: Diagnosis not present

## 2020-05-20 DIAGNOSIS — E782 Mixed hyperlipidemia: Secondary | ICD-10-CM | POA: Diagnosis not present

## 2020-05-20 DIAGNOSIS — Z0000 Encounter for general adult medical examination without abnormal findings: Secondary | ICD-10-CM | POA: Diagnosis not present

## 2020-05-20 DIAGNOSIS — I480 Paroxysmal atrial fibrillation: Secondary | ICD-10-CM | POA: Diagnosis not present

## 2020-07-29 ENCOUNTER — Ambulatory Visit (INDEPENDENT_AMBULATORY_CARE_PROVIDER_SITE_OTHER): Payer: PPO

## 2020-07-29 DIAGNOSIS — I495 Sick sinus syndrome: Secondary | ICD-10-CM

## 2020-07-31 LAB — CUP PACEART REMOTE DEVICE CHECK
Battery Remaining Longevity: 54 mo
Battery Remaining Percentage: 57 %
Brady Statistic RA Percent Paced: 91 %
Brady Statistic RV Percent Paced: 5 %
Date Time Interrogation Session: 20220302014200
Implantable Lead Implant Date: 20130708
Implantable Lead Implant Date: 20130708
Implantable Lead Location: 753859
Implantable Lead Location: 753860
Implantable Lead Model: 4136
Implantable Lead Model: 4137
Implantable Lead Serial Number: 29021711
Implantable Lead Serial Number: 29201221
Implantable Pulse Generator Implant Date: 20130708
Lead Channel Impedance Value: 572 Ohm
Lead Channel Impedance Value: 605 Ohm
Lead Channel Pacing Threshold Amplitude: 0.5 V
Lead Channel Pacing Threshold Pulse Width: 0.4 ms
Lead Channel Setting Pacing Amplitude: 2 V
Lead Channel Setting Pacing Amplitude: 2.4 V
Lead Channel Setting Pacing Pulse Width: 0.6 ms
Lead Channel Setting Sensing Sensitivity: 2.5 mV
Pulse Gen Serial Number: 112155

## 2020-08-06 NOTE — Progress Notes (Signed)
Remote pacemaker transmission.   

## 2020-08-13 DIAGNOSIS — D2261 Melanocytic nevi of right upper limb, including shoulder: Secondary | ICD-10-CM | POA: Diagnosis not present

## 2020-08-13 DIAGNOSIS — L570 Actinic keratosis: Secondary | ICD-10-CM | POA: Diagnosis not present

## 2020-08-13 DIAGNOSIS — D225 Melanocytic nevi of trunk: Secondary | ICD-10-CM | POA: Diagnosis not present

## 2020-08-13 DIAGNOSIS — D2262 Melanocytic nevi of left upper limb, including shoulder: Secondary | ICD-10-CM | POA: Diagnosis not present

## 2020-08-13 DIAGNOSIS — D2272 Melanocytic nevi of left lower limb, including hip: Secondary | ICD-10-CM | POA: Diagnosis not present

## 2020-08-27 DIAGNOSIS — I480 Paroxysmal atrial fibrillation: Secondary | ICD-10-CM | POA: Diagnosis not present

## 2020-08-27 DIAGNOSIS — R7303 Prediabetes: Secondary | ICD-10-CM | POA: Diagnosis not present

## 2020-08-27 DIAGNOSIS — I10 Essential (primary) hypertension: Secondary | ICD-10-CM | POA: Diagnosis not present

## 2020-08-27 DIAGNOSIS — E782 Mixed hyperlipidemia: Secondary | ICD-10-CM | POA: Diagnosis not present

## 2020-09-24 DIAGNOSIS — R634 Abnormal weight loss: Secondary | ICD-10-CM | POA: Diagnosis not present

## 2020-09-26 DIAGNOSIS — I480 Paroxysmal atrial fibrillation: Secondary | ICD-10-CM | POA: Diagnosis not present

## 2020-09-26 DIAGNOSIS — E782 Mixed hyperlipidemia: Secondary | ICD-10-CM | POA: Diagnosis not present

## 2020-09-26 DIAGNOSIS — R7303 Prediabetes: Secondary | ICD-10-CM | POA: Diagnosis not present

## 2020-09-26 DIAGNOSIS — I10 Essential (primary) hypertension: Secondary | ICD-10-CM | POA: Diagnosis not present

## 2020-09-30 DIAGNOSIS — I10 Essential (primary) hypertension: Secondary | ICD-10-CM | POA: Diagnosis not present

## 2020-09-30 DIAGNOSIS — R7303 Prediabetes: Secondary | ICD-10-CM | POA: Diagnosis not present

## 2020-09-30 DIAGNOSIS — Z950 Presence of cardiac pacemaker: Secondary | ICD-10-CM | POA: Diagnosis not present

## 2020-09-30 DIAGNOSIS — R1084 Generalized abdominal pain: Secondary | ICD-10-CM | POA: Diagnosis not present

## 2020-09-30 DIAGNOSIS — R634 Abnormal weight loss: Secondary | ICD-10-CM | POA: Diagnosis not present

## 2020-10-06 ENCOUNTER — Other Ambulatory Visit: Payer: Self-pay | Admitting: Internal Medicine

## 2020-10-06 DIAGNOSIS — R1084 Generalized abdominal pain: Secondary | ICD-10-CM

## 2020-10-06 DIAGNOSIS — R634 Abnormal weight loss: Secondary | ICD-10-CM

## 2020-10-06 DIAGNOSIS — R7303 Prediabetes: Secondary | ICD-10-CM

## 2020-10-14 DIAGNOSIS — Z1211 Encounter for screening for malignant neoplasm of colon: Secondary | ICD-10-CM | POA: Diagnosis not present

## 2020-10-19 ENCOUNTER — Ambulatory Visit
Admission: RE | Admit: 2020-10-19 | Discharge: 2020-10-19 | Disposition: A | Discharge status: Home / Self Care | Payer: PPO | Source: Ambulatory Visit | Attending: Internal Medicine | Admitting: Internal Medicine

## 2020-10-19 DIAGNOSIS — R634 Abnormal weight loss: Secondary | ICD-10-CM

## 2020-10-19 DIAGNOSIS — N200 Calculus of kidney: Secondary | ICD-10-CM | POA: Diagnosis not present

## 2020-10-19 DIAGNOSIS — R7303 Prediabetes: Secondary | ICD-10-CM

## 2020-10-19 DIAGNOSIS — R109 Unspecified abdominal pain: Secondary | ICD-10-CM | POA: Diagnosis not present

## 2020-10-19 DIAGNOSIS — R1084 Generalized abdominal pain: Secondary | ICD-10-CM

## 2020-10-27 DIAGNOSIS — I10 Essential (primary) hypertension: Secondary | ICD-10-CM | POA: Diagnosis not present

## 2020-10-27 DIAGNOSIS — I480 Paroxysmal atrial fibrillation: Secondary | ICD-10-CM | POA: Diagnosis not present

## 2020-10-27 DIAGNOSIS — E782 Mixed hyperlipidemia: Secondary | ICD-10-CM | POA: Diagnosis not present

## 2020-10-28 ENCOUNTER — Ambulatory Visit (INDEPENDENT_AMBULATORY_CARE_PROVIDER_SITE_OTHER): Payer: PPO

## 2020-10-28 ENCOUNTER — Telehealth: Payer: Self-pay | Admitting: Cardiovascular Disease

## 2020-10-28 DIAGNOSIS — I495 Sick sinus syndrome: Secondary | ICD-10-CM | POA: Diagnosis not present

## 2020-10-28 NOTE — Telephone Encounter (Signed)
Returned call to patient, advised patient that samples of Xarelto 20mg  LOT: [id number] EXP: 04/24 were at the front desk for pick up. Advised patient to call back to office with any issues, questions, or concerns. Patient verbalized understanding.

## 2020-10-28 NOTE — Telephone Encounter (Signed)
Patient calling the office for samples of medication:   1.  What medication and dosage are you requesting samples for? rivaroxaban (XARELTO) 20 MG TABS tablet  2.  Are you currently out of this medication? Yes   

## 2020-10-29 LAB — CUP PACEART REMOTE DEVICE CHECK
Battery Remaining Longevity: 48 mo
Battery Remaining Percentage: 54 %
Brady Statistic RA Percent Paced: 91 %
Brady Statistic RV Percent Paced: 4 %
Date Time Interrogation Session: 20220601014100
Implantable Lead Implant Date: 20130708
Implantable Lead Implant Date: 20130708
Implantable Lead Location: 753859
Implantable Lead Location: 753860
Implantable Lead Model: 4136
Implantable Lead Model: 4137
Implantable Lead Serial Number: 29021711
Implantable Lead Serial Number: 29201221
Implantable Pulse Generator Implant Date: 20130708
Lead Channel Impedance Value: 559 Ohm
Lead Channel Impedance Value: 619 Ohm
Lead Channel Pacing Threshold Amplitude: 0.5 V
Lead Channel Pacing Threshold Pulse Width: 0.4 ms
Lead Channel Setting Pacing Amplitude: 2 V
Lead Channel Setting Pacing Amplitude: 2.4 V
Lead Channel Setting Pacing Pulse Width: 0.6 ms
Lead Channel Setting Sensing Sensitivity: 2.5 mV
Pulse Gen Serial Number: 112155

## 2020-11-18 DIAGNOSIS — E782 Mixed hyperlipidemia: Secondary | ICD-10-CM | POA: Diagnosis not present

## 2020-11-18 DIAGNOSIS — R7303 Prediabetes: Secondary | ICD-10-CM | POA: Diagnosis not present

## 2020-11-19 NOTE — Progress Notes (Signed)
Remote pacemaker transmission.   

## 2020-11-25 DIAGNOSIS — E782 Mixed hyperlipidemia: Secondary | ICD-10-CM | POA: Diagnosis not present

## 2020-11-25 DIAGNOSIS — I495 Sick sinus syndrome: Secondary | ICD-10-CM | POA: Diagnosis not present

## 2020-11-25 DIAGNOSIS — Z23 Encounter for immunization: Secondary | ICD-10-CM | POA: Diagnosis not present

## 2020-11-25 DIAGNOSIS — I10 Essential (primary) hypertension: Secondary | ICD-10-CM | POA: Diagnosis not present

## 2020-11-25 DIAGNOSIS — R7303 Prediabetes: Secondary | ICD-10-CM | POA: Diagnosis not present

## 2020-11-25 DIAGNOSIS — R634 Abnormal weight loss: Secondary | ICD-10-CM | POA: Diagnosis not present

## 2020-11-25 DIAGNOSIS — I480 Paroxysmal atrial fibrillation: Secondary | ICD-10-CM | POA: Diagnosis not present

## 2020-11-26 DIAGNOSIS — I480 Paroxysmal atrial fibrillation: Secondary | ICD-10-CM | POA: Diagnosis not present

## 2020-11-26 DIAGNOSIS — I10 Essential (primary) hypertension: Secondary | ICD-10-CM | POA: Diagnosis not present

## 2020-11-26 DIAGNOSIS — E782 Mixed hyperlipidemia: Secondary | ICD-10-CM | POA: Diagnosis not present

## 2020-11-26 DIAGNOSIS — R7303 Prediabetes: Secondary | ICD-10-CM | POA: Diagnosis not present

## 2020-12-27 DIAGNOSIS — E782 Mixed hyperlipidemia: Secondary | ICD-10-CM | POA: Diagnosis not present

## 2020-12-27 DIAGNOSIS — I10 Essential (primary) hypertension: Secondary | ICD-10-CM | POA: Diagnosis not present

## 2020-12-27 DIAGNOSIS — I480 Paroxysmal atrial fibrillation: Secondary | ICD-10-CM | POA: Diagnosis not present

## 2021-01-11 ENCOUNTER — Encounter: Payer: Self-pay | Admitting: Cardiovascular Disease

## 2021-01-11 ENCOUNTER — Other Ambulatory Visit: Payer: Self-pay

## 2021-01-11 ENCOUNTER — Ambulatory Visit (INDEPENDENT_AMBULATORY_CARE_PROVIDER_SITE_OTHER): Payer: PPO | Admitting: Cardiovascular Disease

## 2021-01-11 VITALS — BP 120/68 | HR 69 | Ht 72.0 in | Wt 221.4 lb

## 2021-01-11 DIAGNOSIS — Z7901 Long term (current) use of anticoagulants: Secondary | ICD-10-CM | POA: Diagnosis not present

## 2021-01-11 DIAGNOSIS — Z950 Presence of cardiac pacemaker: Secondary | ICD-10-CM | POA: Diagnosis not present

## 2021-01-11 DIAGNOSIS — I472 Ventricular tachycardia: Secondary | ICD-10-CM | POA: Diagnosis not present

## 2021-01-11 DIAGNOSIS — I495 Sick sinus syndrome: Secondary | ICD-10-CM | POA: Diagnosis not present

## 2021-01-11 DIAGNOSIS — I480 Paroxysmal atrial fibrillation: Secondary | ICD-10-CM | POA: Diagnosis not present

## 2021-01-11 DIAGNOSIS — I10 Essential (primary) hypertension: Secondary | ICD-10-CM

## 2021-01-11 DIAGNOSIS — I4729 Other ventricular tachycardia: Secondary | ICD-10-CM

## 2021-01-11 NOTE — Progress Notes (Signed)
Cardiology Office Note    Date:  01/13/2021   ID:  George Wolf 07-14-1945, MRN [id number]  PCP:  Merrilee Seashore, MD  Cardiologist:   Sanda Klein, MD   Chief Complaint  Patient presents with   Pacemaker Check    History of Present Illness:  George Wolf is a 75 y.o. male with HTN, paroxysmal atrial fibrillation and symptomatic sinus node dysfunction who received a dual-chamber permanent pacemaker in 2013 and returns for follow-up. He does not have significant structural heart disease.   The patient specifically denies any chest pain at rest exertion, dyspnea at rest or with exertion, orthopnea, paroxysmal nocturnal dyspnea, syncope, palpitations, focal neurological deficits, intermittent claudication, lower extremity edema, unexplained weight gain, cough, hemoptysis or wheezing.   Interrogation of his pacemaker shows normal device function. Battery status is good, with estimated longevity of 4 years.  Atrial fibrillation has not been recorded in over 4 years now.  As before, he has 92% atrial paced and only 5% ventricular paced beats.  Underlying rhythm today is sinus bradycardia around 50 bpm. With the current sensor settings, the heart rate histogram distribution is appropriate. A single 7-beat episode of 7-beat NSVT occurred in March.   Past Medical History:  Diagnosis Date   AF (paroxysmal atrial fibrillation) ([place])    Arthritis    Cancer ([place])    SKIN CANCER   Depression    Hypertension    Pacemaker 11/2011   [employer] for SSS   Sinus node dysfunction [facility])    [employer] pacemaker 12/05/11    Past Surgical History:  Procedure Laterality Date   FINGER SURGERY     LEFT THUMB   11/2016   INSERTION OF MESH N/A 05/31/2017   Procedure: INSERTION OF MESH;  Surgeon: Donnie Mesa, MD;  Location: [place];  Service: General;  Laterality: N/A;   PERMANENT PACEMAKER INSERTION  12/05/2011   [employer]   PERMANENT PACEMAKER INSERTION N/A 12/05/2011    Procedure: PERMANENT PACEMAKER INSERTION;  Surgeon: Sanda Klein, MD;  Location: [place] CATH LAB;  Service: Cardiovascular;  Laterality: N/A;   REPLACEMENT TOTAL KNEE  03/23/2009   right   TONSILLECTOMY     TOTAL KNEE ARTHROPLASTY Left 03/16/2020   Procedure: TOTAL KNEE ARTHROPLASTY;  Surgeon: Gaynelle Arabian, MD;  [facility];  Service: Orthopedics;  Laterality: Left;  [id number]   UMBILICAL HERNIA REPAIR N/A 05/31/2017   Procedure: HERNIA REPAIR UMBILICAL ERAS PATHWAY;  Surgeon: Donnie Mesa, MD;  Location: [employer];  Service: General;  Laterality: N/A;  LMA ONLY   US ECHOCARDIOGRAPHY  11/01/2011   mildly dilated LA    Current Medications: Outpatient Medications Prior to Visit  Medication Sig Dispense Refill   allopurinol (ZYLOPRIM) 100 MG tablet Take 100 mg by mouth daily.  9   atorvastatin (LIPITOR) 20 MG tablet Take 20 mg by mouth every evening.   2   benazepril (LOTENSIN) 20 MG tablet Take 20 mg by mouth daily.     buPROPion (WELLBUTRIN SR) 150 MG 12 hr tablet Take 150 mg by mouth 2 (two) times daily.     clonazePAM (KLONOPIN) 0.5 MG tablet Take 0.5 mg by mouth daily as needed for anxiety.      clonazePAM (KLONOPIN) 0.5 MG tablet Take 1 tablet by mouth 2 (two) times daily as needed.     fexofenadine (ALLEGRA) 180 MG tablet Take 180 mg by mouth daily as needed for allergies.      fluticasone (FLONASE) 50 MCG/ACT nasal  spray Place 1 spray into both nostrils daily as needed for allergies.      hydrochlorothiazide (HYDRODIURIL) 25 MG tablet Take 25 mg by mouth daily.     pantoprazole (PROTONIX) 40 MG tablet Take 40 mg by mouth daily.     rivaroxaban (XARELTO) 20 MG TABS tablet TAKE 1 TABLET(20 MG) BY MOUTH DAILY WITH DINNER 90 tablet 3   sertraline (ZOLOFT) 100 MG tablet Take 100 mg by mouth daily.      buPROPion ER (WELLBUTRIN SR) 100 MG 12 hr tablet Take 100 mg by mouth in the morning and at bedtime.     clindamycin (CLEOCIN) 300 MG capsule Take 600 mg by mouth See admin instructions. Take  600 mg by mouth prior to dental procedures (Patient not taking: Reported on 01/11/2021)     gabapentin (NEURONTIN) 100 MG capsule Take a 100 mg capsule three times a day for two weeks following surgery.Then take a 100 mg capsule two times a day for two weeks. Then take a 100 mg capsule once a day for two weeks. Then discontinue. (Patient not taking: Reported on 01/11/2021) 84 capsule 0   methocarbamol (ROBAXIN) 500 MG tablet Take 1 tablet (500 mg total) by mouth every 6 (six) hours as needed for muscle spasms. (Patient not taking: Reported on 01/11/2021) 40 tablet 0   oxyCODONE (OXY IR/ROXICODONE) 5 MG immediate release tablet Take 1-2 tablets (5-10 mg total) by mouth every 6 (six) hours as needed for moderate pain or severe pain. (Patient not taking: Reported on 01/11/2021) 42 tablet 0   No facility-administered medications prior to visit.     Allergies:   Penicillins   Social History   Socioeconomic History   Marital status: Married    Spouse name: Not on file   Number of children: 2   Years of education: Not on file   Highest education level: Not on file  Occupational History   Not on file  Tobacco Use   Smoking status: Never   Smokeless tobacco: Never  Vaping Use   Vaping Use: Never used  Substance and Sexual Activity   Alcohol use: Yes    Comment: OCC WINE    Drug use: No   Sexual activity: Yes    Partners: Female  Other Topics Concern   Not on file  Social History Narrative   Not on file   Social Determinants of Health   Financial Resource Strain: Not on file  Food Insecurity: Not on file  Transportation Needs: Not on file  Physical Activity: Not on file  Stress: Not on file  Social Connections: Not on file     Family History:  The patient's family history includes Diabetes in his father.   ROS:   Please see the history of present illness.    ROS All other systems are reviewed and are negative.   PHYSICAL EXAM:   VS:  BP 120/68 (BP Location: Left Arm, Patient  Position: Sitting, Cuff Size: Large)   Pulse 69   Ht 6' (1.829 m)   Wt 221 lb 6.4 oz (100.4 kg)   SpO2 98%   BMI 30.03 kg/m      General: Alert, oriented x3, no distress, borderline obese Head: no evidence of trauma, PERRL, EOMI, no exophtalmos or lid lag, no myxedema, no xanthelasma; normal ears, nose and oropharynx Neck: normal jugular venous pulsations and no hepatojugular reflux; brisk carotid pulses without delay and no carotid bruits Chest: clear to auscultation, no signs of consolidation by percussion or  palpation, normal fremitus, symmetrical and full respiratory excursions Cardiovascular: normal position and quality of the apical impulse, regular rhythm, normal first and second heart sounds, no murmurs, rubs or gallops Abdomen: no tenderness or distention, no masses by palpation, no abnormal pulsatility or arterial bruits, normal bowel sounds, no hepatosplenomegaly Extremities: no clubbing, cyanosis or edema; 2+ radial, ulnar and brachial pulses bilaterally; 2+ right femoral, posterior tibial and dorsalis pedis pulses; 2+ left femoral, posterior tibial and dorsalis pedis pulses; no subclavian or femoral bruits Neurological: grossly nonfocal Psych: Normal mood and affect    Wt Readings from Last 3 Encounters:  01/11/21 221 lb 6.4 oz (100.4 kg)  03/16/20 232 lb 3 oz (105.3 kg)  03/11/20 232 lb 3 oz (105.3 kg)      Studies/Labs Reviewed:   EKG:  EKG is ordered today.  Personally reviewed, shows A paced, V sensed rhythm, AV delay 212 ms.  BMET    Component Value Date/Time   NA 137 03/17/2020 0335   K 4.0 03/17/2020 0335   CL 100 03/17/2020 0335   CO2 27 03/17/2020 0335   GLUCOSE 133 (H) 03/17/2020 0335   BUN 20 03/17/2020 0335   CREATININE 0.99 03/17/2020 0335   CALCIUM 8.6 (L) 03/17/2020 0335   GFRNONAA >60 03/17/2020 0335   GFRAA >60 05/25/2017 1253   09/24/2020 Creatinine 0.97, TSH   Lipid Panel  No results found for: CHOL, TRIG, HDL, CHOLHDL, VLDL, LDLCALC,  LDLDIRECT, LABVLDL  09/24/2020 Cholesterol 140, HDL 55, LDL 80, TG 57   ASSESSMENT:    1. Paroxysmal atrial fibrillation (HCC)   2. Long term current use of anticoagulant   3. SSS (sick sinus syndrome) ([place])   4. Pacemaker, dual-chamber [employer], sinus node dysfunction, implanted 12/05/11   5. NSVT (nonsustained ventricular tachycardia) (HCC)   6. Essential hypertension      PLAN:  In order of problems listed above:  AFib: This has been extremely infrequent.   CHA2DS2-VASc 3 (age 56, hypertension). Anticoagulation: well tolerated, no bleeding complications. SSS: appropriate sensor settings based on symptoms and heart rate histograms. PPM: normal function, continue remote check every 3 months. NSVT: very rare (once yearly), asymptomatic HTN: controlled Liver mass: Appears to be a benign abnormality, probably a hepatic cyst.    Medication Adjustments/Labs and Tests Ordered: Current medicines are reviewed at length with the patient today.  Concerns regarding medicines are outlined above.  Medication changes, Labs and Tests ordered today are listed in the Patient Instructions below. Patient Instructions  Medication Instructions:  No changes *If you need a refill on your cardiac medications before your next appointment, please call your pharmacy*   Lab Work: None ordered If you have labs (blood work) drawn today and your tests are completely normal, you will receive your results only by: [place] (if you have MyChart) OR A paper copy in the mail If you have any lab test that is abnormal or we need to change your treatment, we will call you to review the results.   Testing/Procedures: None ordered   Follow-Up: At [facility], you and your health needs are our priority.  As part of our continuing mission to provide you with exceptional heart care, we have created designated Provider Care Teams.  These Care Teams include your primary Cardiologist  (physician) and Advanced Practice Providers (APPs -  Physician Assistants and Nurse Practitioners) who all work together to provide you with the care you need, when you need it.  We recommend signing up for the patient  portal called "MyChart".  Sign up information is provided on this After Visit Summary.  MyChart is used to connect with patients for Virtual Visits (Telemedicine).  Patients are able to view lab/test results, encounter notes, upcoming appointments, etc.  Non-urgent messages can be sent to your provider as well.   To learn more about what you can do with MyChart, go to [email or web address].    Your next appointment:   12 month(s)  The format for your next appointment:   In Person  Provider:   Sanda Klein, MD     Signed, Sanda Klein, MD  01/13/2021 8:50 PM    [address], [place], [place]  [address] Phone: [phone]; Fax: [phone]

## 2021-01-11 NOTE — Patient Instructions (Signed)

## 2021-01-13 ENCOUNTER — Encounter: Payer: Self-pay | Admitting: Cardiovascular Disease

## 2021-01-13 DIAGNOSIS — N281 Cyst of kidney, acquired: Secondary | ICD-10-CM | POA: Diagnosis not present

## 2021-01-13 DIAGNOSIS — N200 Calculus of kidney: Secondary | ICD-10-CM | POA: Diagnosis not present

## 2021-01-13 DIAGNOSIS — Q446 Cystic disease of liver: Secondary | ICD-10-CM | POA: Diagnosis not present

## 2021-01-13 DIAGNOSIS — Z8719 Personal history of other diseases of the digestive system: Secondary | ICD-10-CM | POA: Diagnosis not present

## 2021-01-13 DIAGNOSIS — K7689 Other specified diseases of liver: Secondary | ICD-10-CM | POA: Diagnosis not present

## 2021-01-27 ENCOUNTER — Ambulatory Visit (INDEPENDENT_AMBULATORY_CARE_PROVIDER_SITE_OTHER): Payer: PPO

## 2021-01-27 DIAGNOSIS — R7303 Prediabetes: Secondary | ICD-10-CM | POA: Diagnosis not present

## 2021-01-27 DIAGNOSIS — I10 Essential (primary) hypertension: Secondary | ICD-10-CM | POA: Diagnosis not present

## 2021-01-27 DIAGNOSIS — E782 Mixed hyperlipidemia: Secondary | ICD-10-CM | POA: Diagnosis not present

## 2021-01-27 DIAGNOSIS — I495 Sick sinus syndrome: Secondary | ICD-10-CM | POA: Diagnosis not present

## 2021-01-27 DIAGNOSIS — I480 Paroxysmal atrial fibrillation: Secondary | ICD-10-CM | POA: Diagnosis not present

## 2021-01-27 LAB — CUP PACEART REMOTE DEVICE CHECK
Battery Remaining Longevity: 42 mo
Battery Remaining Percentage: 48 %
Brady Statistic RA Percent Paced: 95 %
Brady Statistic RV Percent Paced: 9 %
Date Time Interrogation Session: 20220831014100
Implantable Lead Implant Date: 20130708
Implantable Lead Implant Date: 20130708
Implantable Lead Location: 753859
Implantable Lead Location: 753860
Implantable Lead Model: 4136
Implantable Lead Model: 4137
Implantable Lead Serial Number: 29021711
Implantable Lead Serial Number: 29201221
Implantable Pulse Generator Implant Date: 20130708
Lead Channel Impedance Value: 598 Ohm
Lead Channel Impedance Value: 621 Ohm
Lead Channel Pacing Threshold Amplitude: 0.4 V
Lead Channel Pacing Threshold Pulse Width: 0.4 ms
Lead Channel Setting Pacing Amplitude: 2 V
Lead Channel Setting Pacing Amplitude: 2.4 V
Lead Channel Setting Pacing Pulse Width: 0.6 ms
Lead Channel Setting Sensing Sensitivity: 2.5 mV
Pulse Gen Serial Number: 112155

## 2021-02-05 DIAGNOSIS — K7689 Other specified diseases of liver: Secondary | ICD-10-CM | POA: Diagnosis not present

## 2021-02-05 DIAGNOSIS — Z86010 Personal history of colonic polyps: Secondary | ICD-10-CM | POA: Diagnosis not present

## 2021-02-09 NOTE — Progress Notes (Signed)
Remote pacemaker transmission.   

## 2021-02-21 DIAGNOSIS — Z950 Presence of cardiac pacemaker: Secondary | ICD-10-CM | POA: Diagnosis not present

## 2021-02-21 DIAGNOSIS — M25569 Pain in unspecified knee: Secondary | ICD-10-CM | POA: Diagnosis not present

## 2021-02-21 DIAGNOSIS — Z043 Encounter for examination and observation following other accident: Secondary | ICD-10-CM | POA: Diagnosis not present

## 2021-02-21 DIAGNOSIS — I4891 Unspecified atrial fibrillation: Secondary | ICD-10-CM | POA: Diagnosis not present

## 2021-02-21 DIAGNOSIS — Z880 Allergy status to penicillin: Secondary | ICD-10-CM | POA: Diagnosis not present

## 2021-02-21 DIAGNOSIS — S0990XA Unspecified injury of head, initial encounter: Secondary | ICD-10-CM | POA: Diagnosis not present

## 2021-02-21 DIAGNOSIS — Z2839 Other underimmunization status: Secondary | ICD-10-CM | POA: Diagnosis not present

## 2021-02-21 DIAGNOSIS — Z743 Need for continuous supervision: Secondary | ICD-10-CM | POA: Diagnosis not present

## 2021-02-21 DIAGNOSIS — W01198A Fall on same level from slipping, tripping and stumbling with subsequent striking against other object, initial encounter: Secondary | ICD-10-CM | POA: Diagnosis not present

## 2021-02-21 DIAGNOSIS — I10 Essential (primary) hypertension: Secondary | ICD-10-CM | POA: Diagnosis not present

## 2021-02-26 DIAGNOSIS — R7303 Prediabetes: Secondary | ICD-10-CM | POA: Diagnosis not present

## 2021-02-26 DIAGNOSIS — E782 Mixed hyperlipidemia: Secondary | ICD-10-CM | POA: Diagnosis not present

## 2021-02-26 DIAGNOSIS — I10 Essential (primary) hypertension: Secondary | ICD-10-CM | POA: Diagnosis not present

## 2021-02-26 DIAGNOSIS — I480 Paroxysmal atrial fibrillation: Secondary | ICD-10-CM | POA: Diagnosis not present

## 2021-03-26 DIAGNOSIS — Z23 Encounter for immunization: Secondary | ICD-10-CM | POA: Diagnosis not present

## 2021-03-29 DIAGNOSIS — R7303 Prediabetes: Secondary | ICD-10-CM | POA: Diagnosis not present

## 2021-03-29 DIAGNOSIS — I480 Paroxysmal atrial fibrillation: Secondary | ICD-10-CM | POA: Diagnosis not present

## 2021-03-29 DIAGNOSIS — E782 Mixed hyperlipidemia: Secondary | ICD-10-CM | POA: Diagnosis not present

## 2021-03-29 DIAGNOSIS — I10 Essential (primary) hypertension: Secondary | ICD-10-CM | POA: Diagnosis not present

## 2021-04-09 DIAGNOSIS — Z96652 Presence of left artificial knee joint: Secondary | ICD-10-CM | POA: Diagnosis not present

## 2021-04-09 DIAGNOSIS — Z96653 Presence of artificial knee joint, bilateral: Secondary | ICD-10-CM | POA: Diagnosis not present

## 2021-04-09 DIAGNOSIS — Z96651 Presence of right artificial knee joint: Secondary | ICD-10-CM | POA: Diagnosis not present

## 2021-04-28 ENCOUNTER — Ambulatory Visit (INDEPENDENT_AMBULATORY_CARE_PROVIDER_SITE_OTHER): Payer: PPO

## 2021-04-28 DIAGNOSIS — I4891 Unspecified atrial fibrillation: Secondary | ICD-10-CM | POA: Diagnosis not present

## 2021-04-28 DIAGNOSIS — J019 Acute sinusitis, unspecified: Secondary | ICD-10-CM | POA: Diagnosis not present

## 2021-04-28 DIAGNOSIS — Z9189 Other specified personal risk factors, not elsewhere classified: Secondary | ICD-10-CM | POA: Diagnosis not present

## 2021-04-28 DIAGNOSIS — I495 Sick sinus syndrome: Secondary | ICD-10-CM | POA: Diagnosis not present

## 2021-04-28 DIAGNOSIS — Z950 Presence of cardiac pacemaker: Secondary | ICD-10-CM | POA: Diagnosis not present

## 2021-04-28 DIAGNOSIS — Z7901 Long term (current) use of anticoagulants: Secondary | ICD-10-CM | POA: Diagnosis not present

## 2021-04-28 LAB — CUP PACEART REMOTE DEVICE CHECK
Battery Remaining Longevity: 42 mo
Battery Remaining Percentage: 47 %
Brady Statistic RA Percent Paced: 96 %
Brady Statistic RV Percent Paced: 7 %
Date Time Interrogation Session: 20221130014100
Implantable Lead Implant Date: 20130708
Implantable Lead Implant Date: 20130708
Implantable Lead Location: 753859
Implantable Lead Location: 753860
Implantable Lead Model: 4136
Implantable Lead Model: 4137
Implantable Lead Serial Number: 29021711
Implantable Lead Serial Number: 29201221
Implantable Pulse Generator Implant Date: 20130708
Lead Channel Impedance Value: 557 Ohm
Lead Channel Impedance Value: 575 Ohm
Lead Channel Pacing Threshold Amplitude: 0.4 V
Lead Channel Pacing Threshold Pulse Width: 0.4 ms
Lead Channel Setting Pacing Amplitude: 2 V
Lead Channel Setting Pacing Amplitude: 2.4 V
Lead Channel Setting Pacing Pulse Width: 0.6 ms
Lead Channel Setting Sensing Sensitivity: 2.5 mV
Pulse Gen Serial Number: 112155

## 2021-05-05 DIAGNOSIS — E785 Hyperlipidemia, unspecified: Secondary | ICD-10-CM | POA: Diagnosis not present

## 2021-05-05 DIAGNOSIS — I10 Essential (primary) hypertension: Secondary | ICD-10-CM | POA: Diagnosis not present

## 2021-05-05 DIAGNOSIS — Z79899 Other long term (current) drug therapy: Secondary | ICD-10-CM | POA: Diagnosis not present

## 2021-05-05 DIAGNOSIS — K6389 Other specified diseases of intestine: Secondary | ICD-10-CM | POA: Diagnosis not present

## 2021-05-05 DIAGNOSIS — Q446 Cystic disease of liver: Secondary | ICD-10-CM | POA: Diagnosis not present

## 2021-05-05 DIAGNOSIS — Z950 Presence of cardiac pacemaker: Secondary | ICD-10-CM | POA: Diagnosis not present

## 2021-05-05 DIAGNOSIS — F419 Anxiety disorder, unspecified: Secondary | ICD-10-CM | POA: Diagnosis not present

## 2021-05-05 DIAGNOSIS — I4891 Unspecified atrial fibrillation: Secondary | ICD-10-CM | POA: Diagnosis not present

## 2021-05-05 DIAGNOSIS — Z86010 Personal history of colonic polyps: Secondary | ICD-10-CM | POA: Diagnosis not present

## 2021-05-05 DIAGNOSIS — Z1211 Encounter for screening for malignant neoplasm of colon: Secondary | ICD-10-CM | POA: Diagnosis not present

## 2021-05-05 DIAGNOSIS — Z7901 Long term (current) use of anticoagulants: Secondary | ICD-10-CM | POA: Diagnosis not present

## 2021-05-05 DIAGNOSIS — M109 Gout, unspecified: Secondary | ICD-10-CM | POA: Diagnosis not present

## 2021-05-05 DIAGNOSIS — J309 Allergic rhinitis, unspecified: Secondary | ICD-10-CM | POA: Diagnosis not present

## 2021-05-05 DIAGNOSIS — F32A Depression, unspecified: Secondary | ICD-10-CM | POA: Diagnosis not present

## 2021-05-05 DIAGNOSIS — K635 Polyp of colon: Secondary | ICD-10-CM | POA: Diagnosis not present

## 2021-05-07 NOTE — Progress Notes (Signed)
Remote pacemaker transmission.   

## 2021-06-02 DIAGNOSIS — I495 Sick sinus syndrome: Secondary | ICD-10-CM | POA: Diagnosis not present

## 2021-06-02 DIAGNOSIS — Z0000 Encounter for general adult medical examination without abnormal findings: Secondary | ICD-10-CM | POA: Diagnosis not present

## 2021-06-02 DIAGNOSIS — I10 Essential (primary) hypertension: Secondary | ICD-10-CM | POA: Diagnosis not present

## 2021-06-02 DIAGNOSIS — I480 Paroxysmal atrial fibrillation: Secondary | ICD-10-CM | POA: Diagnosis not present

## 2021-06-02 DIAGNOSIS — E782 Mixed hyperlipidemia: Secondary | ICD-10-CM | POA: Diagnosis not present

## 2021-06-02 DIAGNOSIS — R7303 Prediabetes: Secondary | ICD-10-CM | POA: Diagnosis not present

## 2021-06-09 DIAGNOSIS — M150 Primary generalized (osteo)arthritis: Secondary | ICD-10-CM | POA: Diagnosis not present

## 2021-06-09 DIAGNOSIS — F320 Major depressive disorder, single episode, mild: Secondary | ICD-10-CM | POA: Diagnosis not present

## 2021-06-09 DIAGNOSIS — Z0000 Encounter for general adult medical examination without abnormal findings: Secondary | ICD-10-CM | POA: Diagnosis not present

## 2021-06-09 DIAGNOSIS — R7303 Prediabetes: Secondary | ICD-10-CM | POA: Diagnosis not present

## 2021-06-09 DIAGNOSIS — I495 Sick sinus syndrome: Secondary | ICD-10-CM | POA: Diagnosis not present

## 2021-06-09 DIAGNOSIS — I480 Paroxysmal atrial fibrillation: Secondary | ICD-10-CM | POA: Diagnosis not present

## 2021-06-09 DIAGNOSIS — I700 Atherosclerosis of aorta: Secondary | ICD-10-CM | POA: Diagnosis not present

## 2021-06-09 DIAGNOSIS — D6869 Other thrombophilia: Secondary | ICD-10-CM | POA: Diagnosis not present

## 2021-06-09 DIAGNOSIS — E782 Mixed hyperlipidemia: Secondary | ICD-10-CM | POA: Diagnosis not present

## 2021-06-09 DIAGNOSIS — I10 Essential (primary) hypertension: Secondary | ICD-10-CM | POA: Diagnosis not present

## 2021-06-09 DIAGNOSIS — D692 Other nonthrombocytopenic purpura: Secondary | ICD-10-CM | POA: Diagnosis not present

## 2021-07-20 DIAGNOSIS — Z961 Presence of intraocular lens: Secondary | ICD-10-CM | POA: Diagnosis not present

## 2021-07-28 ENCOUNTER — Ambulatory Visit (INDEPENDENT_AMBULATORY_CARE_PROVIDER_SITE_OTHER): Payer: PPO

## 2021-07-28 DIAGNOSIS — I495 Sick sinus syndrome: Secondary | ICD-10-CM

## 2021-07-28 LAB — CUP PACEART REMOTE DEVICE CHECK
Battery Remaining Longevity: 42 mo
Battery Remaining Percentage: 43 %
Brady Statistic RA Percent Paced: 96 %
Brady Statistic RV Percent Paced: 7 %
Date Time Interrogation Session: 20230301014100
Implantable Lead Implant Date: 20130708
Implantable Lead Implant Date: 20130708
Implantable Lead Location: 753859
Implantable Lead Location: 753860
Implantable Lead Model: 4136
Implantable Lead Model: 4137
Implantable Lead Serial Number: 29021711
Implantable Lead Serial Number: 29201221
Implantable Pulse Generator Implant Date: 20130708
Lead Channel Impedance Value: 572 Ohm
Lead Channel Impedance Value: 594 Ohm
Lead Channel Pacing Threshold Amplitude: 0.4 V
Lead Channel Pacing Threshold Pulse Width: 0.4 ms
Lead Channel Setting Pacing Amplitude: 2 V
Lead Channel Setting Pacing Amplitude: 2.4 V
Lead Channel Setting Pacing Pulse Width: 0.6 ms
Lead Channel Setting Sensing Sensitivity: 2.5 mV
Pulse Gen Serial Number: 112155

## 2021-08-06 NOTE — Progress Notes (Signed)
Remote pacemaker transmission.   

## 2021-08-23 DIAGNOSIS — L820 Inflamed seborrheic keratosis: Secondary | ICD-10-CM | POA: Diagnosis not present

## 2021-08-23 DIAGNOSIS — D2271 Melanocytic nevi of right lower limb, including hip: Secondary | ICD-10-CM | POA: Diagnosis not present

## 2021-08-23 DIAGNOSIS — D1801 Hemangioma of skin and subcutaneous tissue: Secondary | ICD-10-CM | POA: Diagnosis not present

## 2021-08-23 DIAGNOSIS — D225 Melanocytic nevi of trunk: Secondary | ICD-10-CM | POA: Diagnosis not present

## 2021-08-23 DIAGNOSIS — L570 Actinic keratosis: Secondary | ICD-10-CM | POA: Diagnosis not present

## 2021-08-23 DIAGNOSIS — D2272 Melanocytic nevi of left lower limb, including hip: Secondary | ICD-10-CM | POA: Diagnosis not present

## 2021-09-06 DIAGNOSIS — R293 Abnormal posture: Secondary | ICD-10-CM | POA: Diagnosis not present

## 2021-09-06 DIAGNOSIS — M6281 Muscle weakness (generalized): Secondary | ICD-10-CM | POA: Diagnosis not present

## 2021-09-13 IMAGING — CT CT HEAD W/O CM
1 series · 16 of 30 positions shown, 20 images · non-contrast
Comparison: None.

CLINICAL DATA: Speech difficulty.

EXAM:
CT HEAD WITHOUT CONTRAST
TECHNIQUE: Contiguous axial images were obtained from the base of the skull
through the vertex without intravenous contrast.

[Series 2: head w/(date) · axial · 0.42mm/px · z∈[-232,-82]mm · 16 of 34 slices shown, 20 images]
[im 2/34  brain]
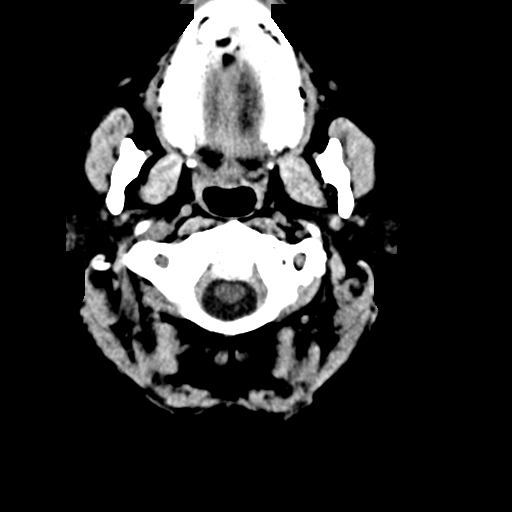
[im 2/34  bone]
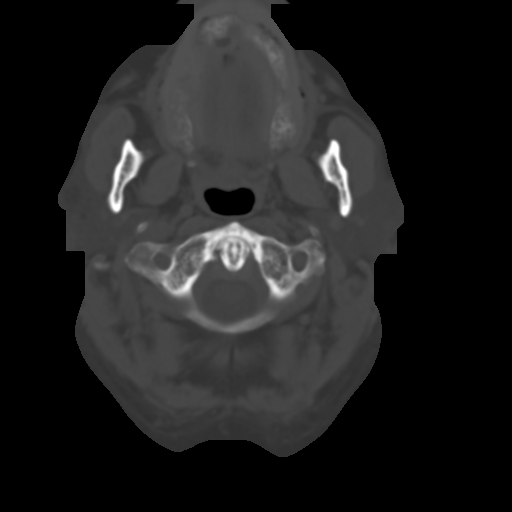
[im 4/34  brain]
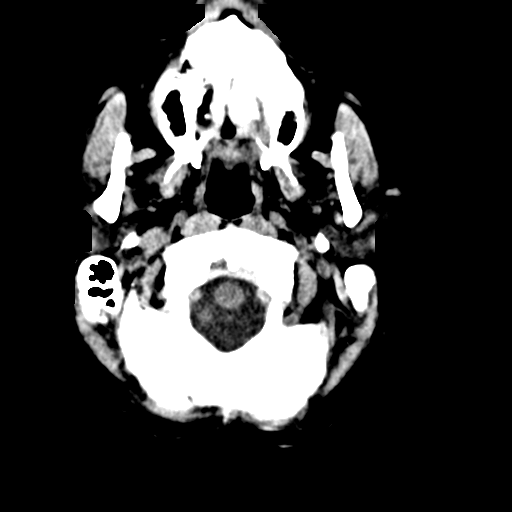
[im 6/34  brain]
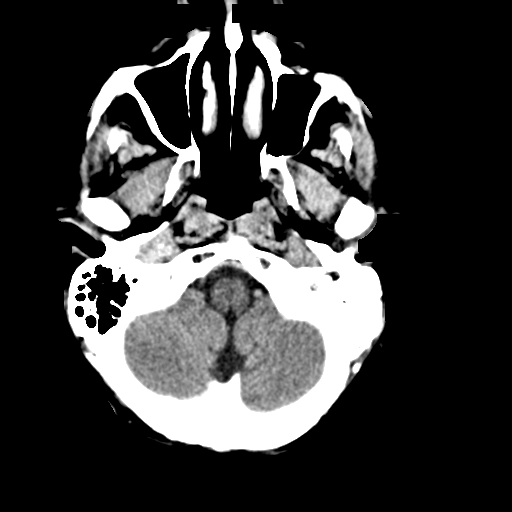
[im 8/34  brain]
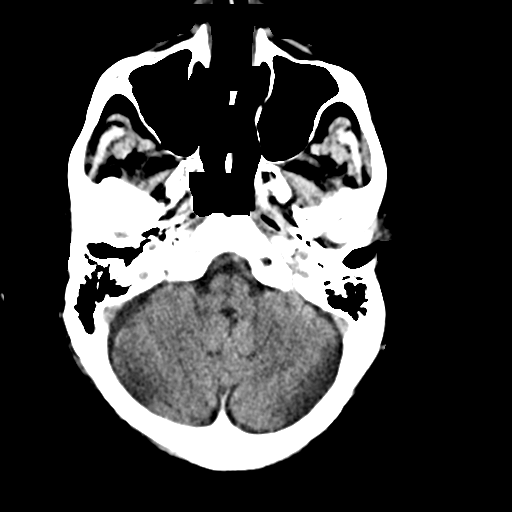
[im 10/34  brain]
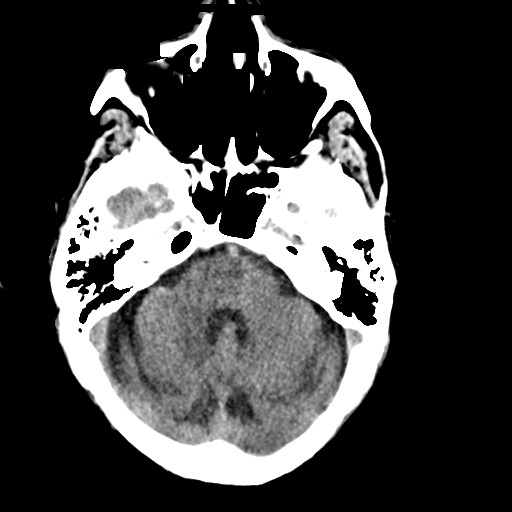
[im 10/34  bone]
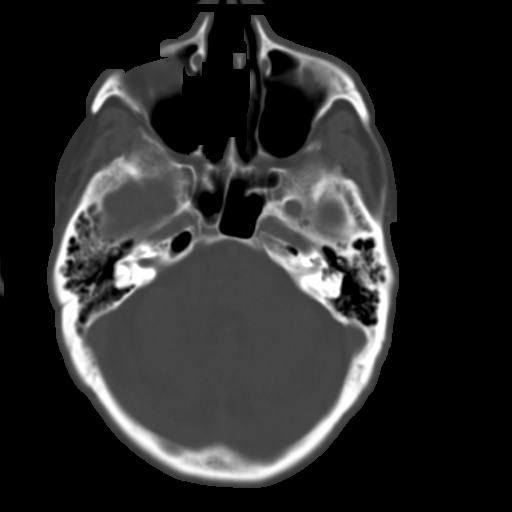
[im 12/34  brain]
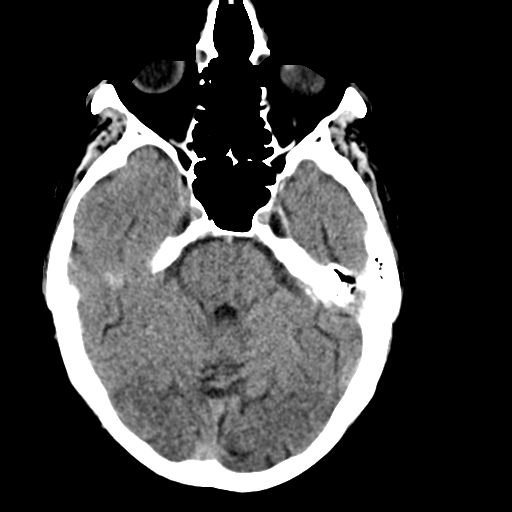
[im 14/34  brain]
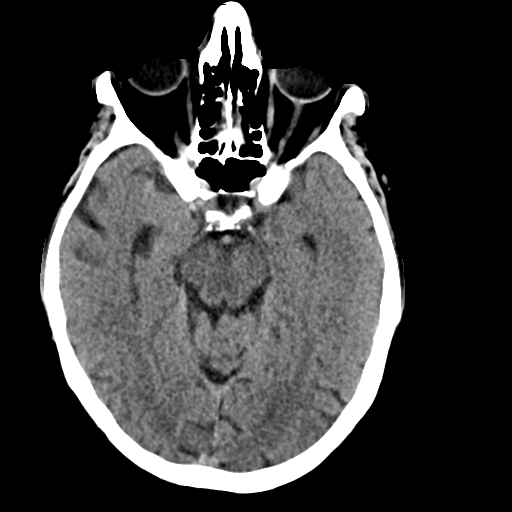
[im 16/34  brain]
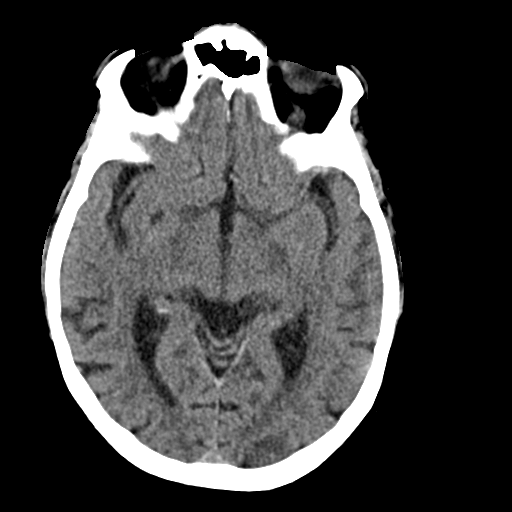
[im 18/34  brain]
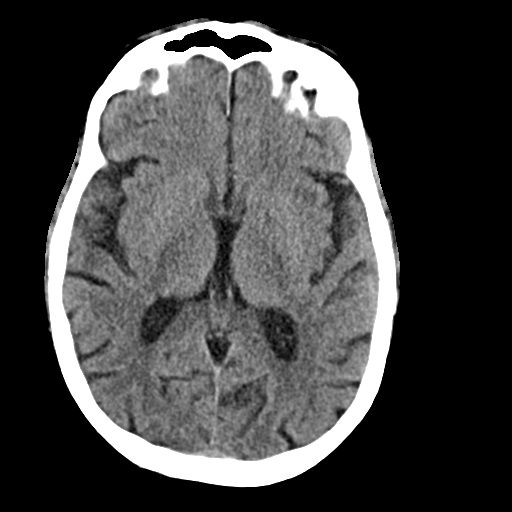
[im 18/34  bone]
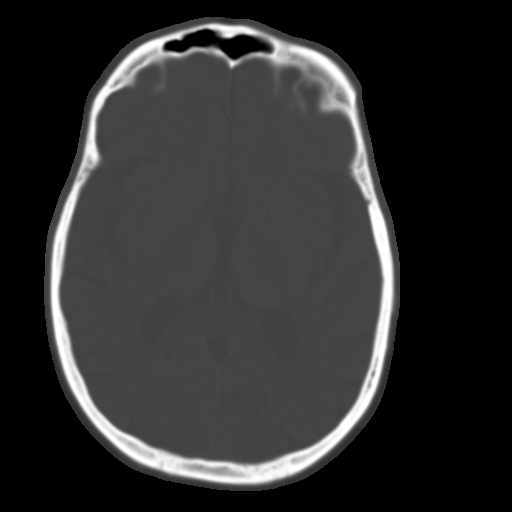
[im 20/34  brain]
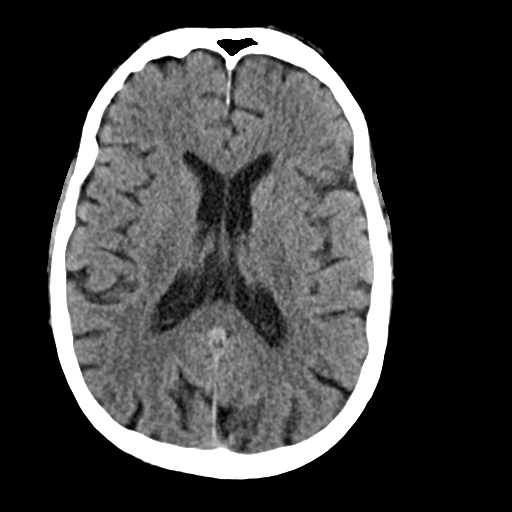
[im 22/34  brain]
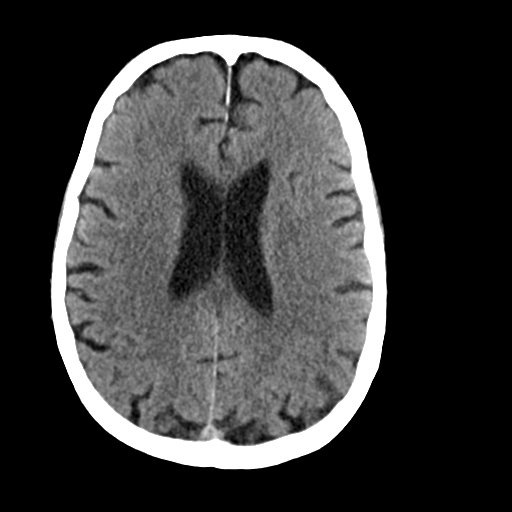
[im 24/34  brain]
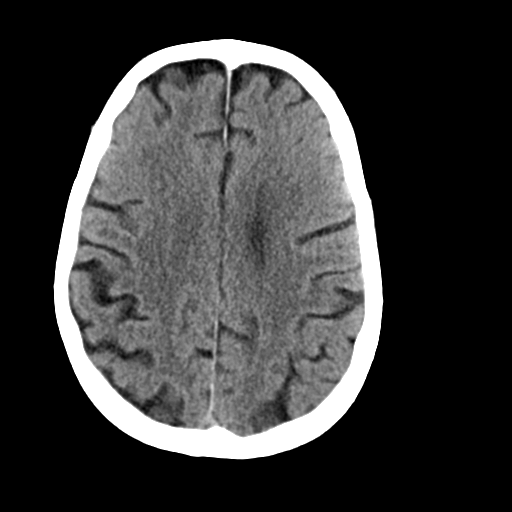
[im 26/34  brain]
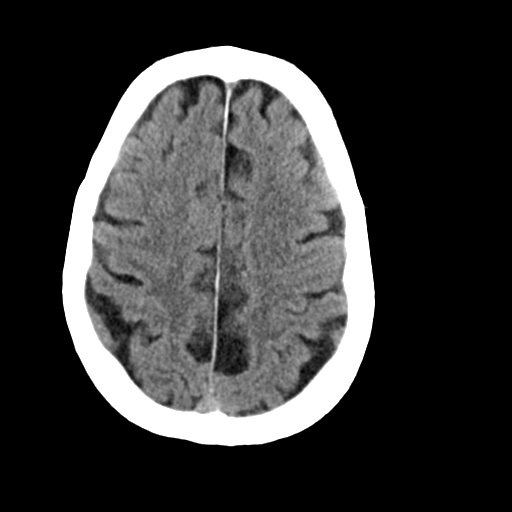
[im 26/34  bone]
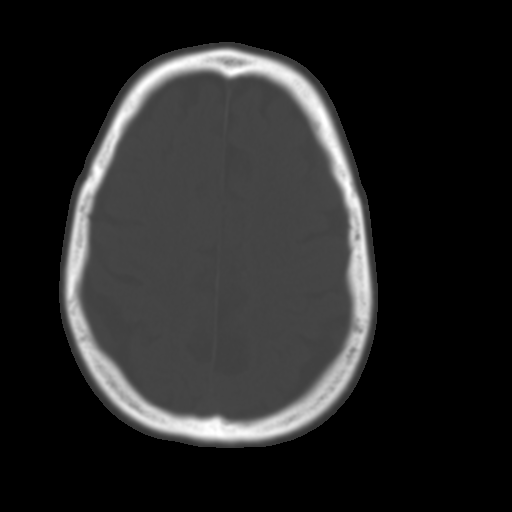
[im 28/34  brain]
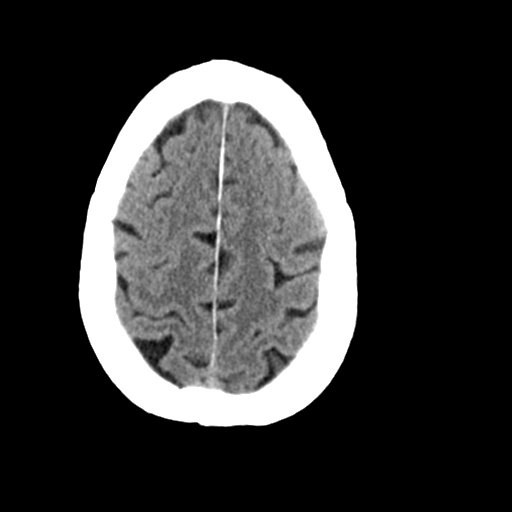
[im 30/34  brain]
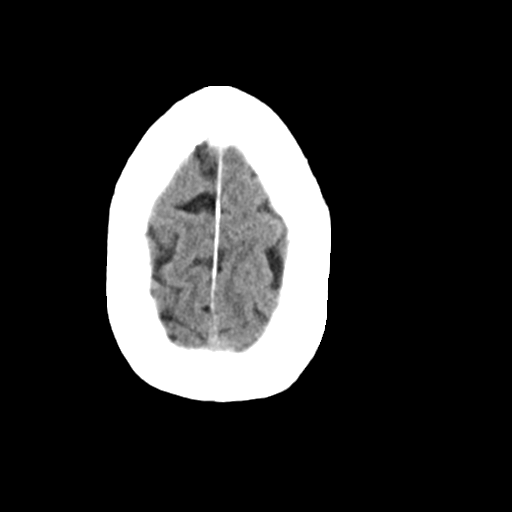
[im 32/34  brain]
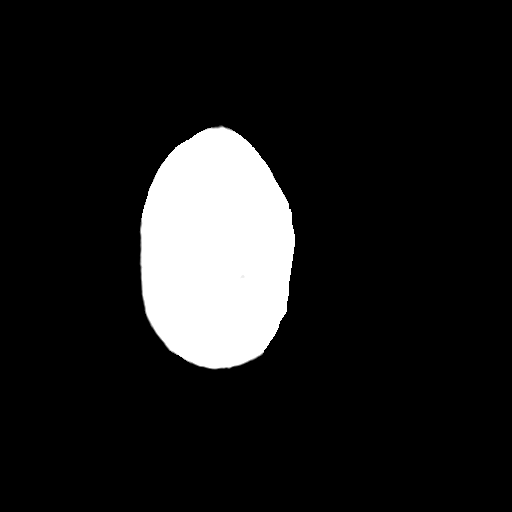

[16 of 30 positions shown; findings below may reference images not displayed]

FINDINGS: Brain: No evidence of acute infarction, hemorrhage, hydrocephalus,
extra-axial collection or mass lesion/mass effect.

Vascular: No hyperdense vessel or unexpected calcification.

Skull: Normal. Negative for fracture or focal lesion.

Sinuses/Orbits: No acute finding.

Other: None.
IMPRESSION: Normal head CT.

## 2021-09-21 DIAGNOSIS — R293 Abnormal posture: Secondary | ICD-10-CM | POA: Diagnosis not present

## 2021-09-21 DIAGNOSIS — M6281 Muscle weakness (generalized): Secondary | ICD-10-CM | POA: Diagnosis not present

## 2021-10-12 DIAGNOSIS — M6281 Muscle weakness (generalized): Secondary | ICD-10-CM | POA: Diagnosis not present

## 2021-10-12 DIAGNOSIS — R293 Abnormal posture: Secondary | ICD-10-CM | POA: Diagnosis not present

## 2021-10-27 ENCOUNTER — Ambulatory Visit (INDEPENDENT_AMBULATORY_CARE_PROVIDER_SITE_OTHER): Payer: PPO

## 2021-10-27 DIAGNOSIS — I495 Sick sinus syndrome: Secondary | ICD-10-CM

## 2021-10-28 LAB — CUP PACEART REMOTE DEVICE CHECK
Battery Remaining Longevity: 36 mo
Battery Remaining Percentage: 38 %
Brady Statistic RA Percent Paced: 96 %
Brady Statistic RV Percent Paced: 7 %
Date Time Interrogation Session: 20230531014000
Implantable Lead Implant Date: 20130708
Implantable Lead Implant Date: 20130708
Implantable Lead Location: 753859
Implantable Lead Location: 753860
Implantable Lead Model: 4136
Implantable Lead Model: 4137
Implantable Lead Serial Number: 29021711
Implantable Lead Serial Number: 29201221
Implantable Pulse Generator Implant Date: 20130708
Lead Channel Impedance Value: 564 Ohm
Lead Channel Impedance Value: 570 Ohm
Lead Channel Pacing Threshold Amplitude: 0.4 V
Lead Channel Pacing Threshold Pulse Width: 0.4 ms
Lead Channel Setting Pacing Amplitude: 2 V
Lead Channel Setting Pacing Amplitude: 2.4 V
Lead Channel Setting Pacing Pulse Width: 0.6 ms
Lead Channel Setting Sensing Sensitivity: 2.5 mV
Pulse Gen Serial Number: 112155

## 2021-11-09 NOTE — Progress Notes (Signed)
Remote pacemaker transmission.   

## 2021-11-15 DIAGNOSIS — R293 Abnormal posture: Secondary | ICD-10-CM | POA: Diagnosis not present

## 2021-11-15 DIAGNOSIS — M6281 Muscle weakness (generalized): Secondary | ICD-10-CM | POA: Diagnosis not present

## 2021-12-17 DIAGNOSIS — I700 Atherosclerosis of aorta: Secondary | ICD-10-CM | POA: Diagnosis not present

## 2021-12-17 DIAGNOSIS — R7303 Prediabetes: Secondary | ICD-10-CM | POA: Diagnosis not present

## 2021-12-17 DIAGNOSIS — E782 Mixed hyperlipidemia: Secondary | ICD-10-CM | POA: Diagnosis not present

## 2021-12-17 DIAGNOSIS — I10 Essential (primary) hypertension: Secondary | ICD-10-CM | POA: Diagnosis not present

## 2021-12-22 DIAGNOSIS — M150 Primary generalized (osteo)arthritis: Secondary | ICD-10-CM | POA: Diagnosis not present

## 2021-12-22 DIAGNOSIS — D692 Other nonthrombocytopenic purpura: Secondary | ICD-10-CM | POA: Diagnosis not present

## 2021-12-22 DIAGNOSIS — E782 Mixed hyperlipidemia: Secondary | ICD-10-CM | POA: Diagnosis not present

## 2021-12-22 DIAGNOSIS — I480 Paroxysmal atrial fibrillation: Secondary | ICD-10-CM | POA: Diagnosis not present

## 2021-12-22 DIAGNOSIS — D6869 Other thrombophilia: Secondary | ICD-10-CM | POA: Diagnosis not present

## 2021-12-22 DIAGNOSIS — R7303 Prediabetes: Secondary | ICD-10-CM | POA: Diagnosis not present

## 2021-12-22 DIAGNOSIS — I10 Essential (primary) hypertension: Secondary | ICD-10-CM | POA: Diagnosis not present

## 2021-12-22 DIAGNOSIS — F320 Major depressive disorder, single episode, mild: Secondary | ICD-10-CM | POA: Diagnosis not present

## 2021-12-22 DIAGNOSIS — I495 Sick sinus syndrome: Secondary | ICD-10-CM | POA: Diagnosis not present

## 2021-12-22 DIAGNOSIS — I700 Atherosclerosis of aorta: Secondary | ICD-10-CM | POA: Diagnosis not present

## 2021-12-23 ENCOUNTER — Encounter: Payer: Self-pay | Admitting: Cardiovascular Disease

## 2021-12-23 ENCOUNTER — Telehealth: Payer: Self-pay | Admitting: Cardiovascular Disease

## 2021-12-23 DIAGNOSIS — R293 Abnormal posture: Secondary | ICD-10-CM | POA: Diagnosis not present

## 2021-12-23 DIAGNOSIS — M6281 Muscle weakness (generalized): Secondary | ICD-10-CM | POA: Diagnosis not present

## 2021-12-23 NOTE — Telephone Encounter (Signed)
Returned call to patient-patient states his PCP mentioned the [facility] clinical trial and he contacted them and is a candidate for this but wanted to check with Dr. Sallyanne Kuster first.  Will send to MD to review, patient aware

## 2021-12-23 NOTE — Telephone Encounter (Signed)
Error

## 2021-12-23 NOTE — Telephone Encounter (Signed)
I think it is a good idea

## 2021-12-23 NOTE — Telephone Encounter (Signed)
Pt is requesting a call back to see if Dr. Sallyanne Kuster knows anything about the [facility] clinical trial program. Requesting call back to discuss this.

## 2021-12-23 NOTE — Telephone Encounter (Signed)
Left a message for the patient to call back.  

## 2021-12-24 NOTE — Telephone Encounter (Signed)
Left a message for the patient to call back.  

## 2022-01-05 DIAGNOSIS — R293 Abnormal posture: Secondary | ICD-10-CM | POA: Diagnosis not present

## 2022-01-05 DIAGNOSIS — M6281 Muscle weakness (generalized): Secondary | ICD-10-CM | POA: Diagnosis not present

## 2022-01-17 ENCOUNTER — Ambulatory Visit (INDEPENDENT_AMBULATORY_CARE_PROVIDER_SITE_OTHER): Payer: PPO | Admitting: Cardiovascular Disease

## 2022-01-17 ENCOUNTER — Encounter: Payer: Self-pay | Admitting: Cardiovascular Disease

## 2022-01-17 VITALS — BP 112/74 | HR 61 | Ht 72.0 in | Wt 226.0 lb

## 2022-01-17 DIAGNOSIS — I495 Sick sinus syndrome: Secondary | ICD-10-CM | POA: Diagnosis not present

## 2022-01-17 NOTE — Progress Notes (Signed)
Cardiology Office Note    Date:  01/17/2022   ID:  Qualyn, Oyervides Nov 29, 1945, MRN [id number]  PCP:  Merrilee Seashore, MD  Cardiologist:   Sanda Klein, MD   Chief Complaint  Patient presents with   Pacemaker Check    History of Present Illness:  George Wolf is a 76 y.o. male with HTN, paroxysmal atrial fibrillation and symptomatic sinus node dysfunction who received a dual-chamber permanent pacemaker in 2013 and returns for follow-up. He does not have significant structural heart disease.   He feels well for the most part,  but does have occasional mild dyspnea.  It happens when he initiates activity quickly, such as when he goes to climb the stairs after sitting down.  Once he "warms up" he can do pretty hard physical work.  The patient specifically denies any chest pain at rest, dyspnea at rest or with exertion, orthopnea, paroxysmal nocturnal dyspnea, syncope, palpitations, focal neurological deficits, intermittent claudication, lower extremity edema, unexplained weight gain, cough, hemoptysis or wheezing.  He has not had any falls, serious injuries or bleeding.  Device interrogation shows normal pacemaker function.  He is almost always in atrial paced, ventricular sensed rhythm (96% atrial pacing, 8% ventricular pacing).  Estimated generator longevity is 2.5 years.  Lead parameters are excellent, the only exception being the increased threshold for RV pacing, which is now up to 1.2 V at 1.0 ms.  Fortunately he almost never requires RV pacing.  Ventricular sensing and impedance remain excellent.  He has had 3 very brief episodes of atrial fibrillation since his last download.  The longest episode was 1 minute and 42 seconds in duration and occurred in May.  Other episodes were even shorter.  He had an 8 beat episode of nonsustained VT in March.  Overall his rate response settings show decent heart rate histograms, but he may benefit from more rapid initiation of increased  rates.  Changed his accelerometer setting from 11 to 13 and slightly increase the minute ventilation sensor from 12 to 13.     Past Medical History:  Diagnosis Date   AF (paroxysmal atrial fibrillation) ([place])    Arthritis    Cancer ([place])    SKIN CANCER   Depression    Hypertension    Pacemaker 11/2011   [employer] for SSS   Sinus node dysfunction [facility])    [employer] pacemaker 12/05/11    Past Surgical History:  Procedure Laterality Date   FINGER SURGERY     LEFT THUMB   11/2016   INSERTION OF MESH N/A 05/31/2017   Procedure: INSERTION OF MESH;  Surgeon: Donnie Mesa, MD;  Location: [place];  Service: General;  Laterality: N/A;   PERMANENT PACEMAKER INSERTION  12/05/2011   [employer]   PERMANENT PACEMAKER INSERTION N/A 12/05/2011   Procedure: PERMANENT PACEMAKER INSERTION;  Surgeon: Sanda Klein, MD;  Location: [place] CATH LAB;  Service: Cardiovascular;  Laterality: N/A;   REPLACEMENT TOTAL KNEE  03/23/2009   right   TONSILLECTOMY     TOTAL KNEE ARTHROPLASTY Left 03/16/2020   Procedure: TOTAL KNEE ARTHROPLASTY;  Surgeon: Gaynelle Arabian, MD;  [facility];  Service: Orthopedics;  Laterality: Left;  [id number]   UMBILICAL HERNIA REPAIR N/A 05/31/2017   Procedure: HERNIA REPAIR UMBILICAL ERAS PATHWAY;  Surgeon: Donnie Mesa, MD;  Location: [place];  Service: General;  Laterality: N/A;  LMA ONLY   US ECHOCARDIOGRAPHY  11/01/2011   mildly dilated LA    Current Medications: Outpatient Medications  Prior to Visit  Medication Sig Dispense Refill   allopurinol (ZYLOPRIM) 100 MG tablet Take 100 mg by mouth daily.  9   atorvastatin (LIPITOR) 20 MG tablet Take 20 mg by mouth every evening.   2   benazepril (LOTENSIN) 20 MG tablet Take 20 mg by mouth daily.     buPROPion ER (WELLBUTRIN SR) 100 MG 12 hr tablet Take 100 mg by mouth in the morning and at bedtime.     clonazePAM (KLONOPIN) 0.5 MG tablet Take 1 tablet by mouth 2 (two) times daily as needed.     fexofenadine (ALLEGRA)  180 MG tablet Take 180 mg by mouth daily as needed for allergies.      fluticasone (FLONASE) 50 MCG/ACT nasal spray Place 1 spray into both nostrils daily as needed for allergies.      hydrochlorothiazide (HYDRODIURIL) 25 MG tablet Take 25 mg by mouth daily.     rivaroxaban (XARELTO) 20 MG TABS tablet TAKE 1 TABLET(20 MG) BY MOUTH DAILY WITH DINNER 90 tablet 3   sertraline (ZOLOFT) 100 MG tablet Take 100 mg by mouth daily.      clonazePAM (KLONOPIN) 0.5 MG tablet Take 0.5 mg by mouth daily as needed for anxiety.  (Patient not taking: Reported on 01/17/2022)     buPROPion (WELLBUTRIN SR) 150 MG 12 hr tablet Take 150 mg by mouth 2 (two) times daily. (Patient not taking: Reported on 01/17/2022)     pantoprazole (PROTONIX) 40 MG tablet Take 40 mg by mouth daily. (Patient not taking: Reported on 01/17/2022)     No facility-administered medications prior to visit.     Allergies:   Penicillins   Social History   Socioeconomic History   Marital status: Married    Spouse name: Not on file   Number of children: 2   Years of education: Not on file   Highest education level: Not on file  Occupational History   Not on file  Tobacco Use   Smoking status: Never   Smokeless tobacco: Never  Vaping Use   Vaping Use: Never used  Substance and Sexual Activity   Alcohol use: Yes    Comment: OCC WINE    Drug use: No   Sexual activity: Yes    Partners: Female  Other Topics Concern   Not on file  Social History Narrative   Not on file   Social Determinants of Health   Financial Resource Strain: Not on file  Food Insecurity: Not on file  Transportation Needs: Not on file  Physical Activity: Not on file  Stress: Not on file  Social Connections: Not on file     Family History:  The patient's family history includes Diabetes in his father.   ROS:   Please see the history of present illness.    ROS All other systems are reviewed and are negative.   PHYSICAL EXAM:   VS:  BP 112/74 (BP  Location: Left Arm, Patient Position: Sitting, Cuff Size: Normal)   Pulse 61   Ht 6' (1.829 m)   Wt 226 lb (102.5 kg)   SpO2 95%   BMI 30.65 kg/m       General: Alert, oriented x3, no distress, he appears younger than stated age.  Borderline obese.  Healthy left subclavian pacemaker site. Head: no evidence of trauma, PERRL, EOMI, no exophtalmos or lid lag, no myxedema, no xanthelasma; normal ears, nose and oropharynx Neck: normal jugular venous pulsations and no hepatojugular reflux; brisk carotid pulses without delay and no  carotid bruits Chest: clear to auscultation, no signs of consolidation by percussion or palpation, normal fremitus, symmetrical and full respiratory excursions Cardiovascular: normal position and quality of the apical impulse, regular rhythm, normal first and second heart sounds, no murmurs, rubs or gallops Abdomen: no tenderness or distention, no masses by palpation, no abnormal pulsatility or arterial bruits, normal bowel sounds, no hepatosplenomegaly Extremities: no clubbing, cyanosis or edema; 2+ radial, ulnar and brachial pulses bilaterally; 2+ right femoral, posterior tibial and dorsalis pedis pulses; 2+ left femoral, posterior tibial and dorsalis pedis pulses; no subclavian or femoral bruits Neurological: grossly nonfocal Psych: Normal mood and affect     Wt Readings from Last 3 Encounters:  01/17/22 226 lb (102.5 kg)  01/11/21 221 lb 6.4 oz (100.4 kg)  03/16/20 232 lb 3 oz (105.3 kg)      Studies/Labs Reviewed:   EKG:  EKG is ordered today.  It shows atrial paced, ventricular sensed rhythm with slightly longer AV delay at 210 ms and borderline intraventricular conduction delay resembling an incomplete left bundle branch block, but with QRS duration just under 100 ms.  BMET    Component Value Date/Time   NA 137 03/17/2020 0335   K 4.0 03/17/2020 0335   CL 100 03/17/2020 0335   CO2 27 03/17/2020 0335   GLUCOSE 133 (H) 03/17/2020 0335   BUN 20  03/17/2020 0335   CREATININE 0.99 03/17/2020 0335   CALCIUM 8.6 (L) 03/17/2020 0335   GFRNONAA >60 03/17/2020 0335   GFRAA >60 05/25/2017 1253   09/24/2020 Creatinine 0.97, TSH   Lipid Panel  No results found for: "CHOL", "TRIG", "HDL", "CHOLHDL", "VLDL", "LDLCALC", "LDLDIRECT", "LABVLDL"  09/24/2020 Cholesterol 140, HDL 55, LDL 80, TG 57   ASSESSMENT:    1. SSS (sick sinus syndrome) ([place])      PLAN:  In order of problems listed above:  AFib: This has been extremely infrequent, but he has had 3 very brief episodes in the last few months, the longest being only 1 minute 42 seconds in duration.  CHA2DS2-VASc 3 (age 73, hypertension). Anticoagulation: Bleeding complications. SSS: Made some slight adjustments to his sensor settings, in particular increasing the response to accelerometer to allow more prompt changes in rate response with activity. PPM: Otherwise normal device function, continue remote checks every 3 months. NSVT: These are very rare, occurring only about once a year and have been consistently symptomatic. HTN: Well-controlled Obesity: Mild, encourage weight loss.  Discussed healthy dietary changes.    Medication Adjustments/Labs and Tests Ordered: Current medicines are reviewed at length with the patient today.  Concerns regarding medicines are outlined above.  Medication changes, Labs and Tests ordered today are listed in the Patient Instructions below. Patient Instructions  Medication Instructions:  No changes *If you need a refill on your cardiac medications before your next appointment, please call your pharmacy*   Lab Work: None ordered If you have labs (blood work) drawn today and your tests are completely normal, you will receive your results only by: [place] (if you have MyChart) OR A paper copy in the mail If you have any lab test that is abnormal or we need to change your treatment, we will call you to review the  results.   Testing/Procedures: None ordered   Follow-Up: At [facility], you and your health needs are our priority.  As part of our continuing mission to provide you with exceptional heart care, we have created designated Provider Care Teams.  These Care Teams include your primary  Cardiologist (physician) and Advanced Practice Providers (APPs -  Physician Assistants and Nurse Practitioners) who all work together to provide you with the care you need, when you need it.  We recommend signing up for the patient portal called "MyChart".  Sign up information is provided on this After Visit Summary.  MyChart is used to connect with patients for Virtual Visits (Telemedicine).  Patients are able to view lab/test results, encounter notes, upcoming appointments, etc.  Non-urgent messages can be sent to your provider as well.   To learn more about what you can do with MyChart, go to [email or web address].    Your next appointment:   12 month(s)  The format for your next appointment:   In Person  Provider:   Sanda Klein, MD {  Important Information About Sugar         Signed, Sanda Klein, MD  01/17/2022 5:15 PM    [address], [place], [place]   [address] Phone: [phone]; Fax: [phone]

## 2022-01-17 NOTE — Patient Instructions (Signed)

## 2022-01-26 ENCOUNTER — Ambulatory Visit (INDEPENDENT_AMBULATORY_CARE_PROVIDER_SITE_OTHER): Payer: PPO

## 2022-01-26 DIAGNOSIS — I495 Sick sinus syndrome: Secondary | ICD-10-CM

## 2022-01-26 LAB — CUP PACEART REMOTE DEVICE CHECK
Battery Remaining Longevity: 30 mo
Battery Remaining Percentage: 31 %
Brady Statistic RA Percent Paced: 97 %
Brady Statistic RV Percent Paced: 17 %
Date Time Interrogation Session: 20230830014100
Implantable Lead Implant Date: 20130708
Implantable Lead Implant Date: 20130708
Implantable Lead Location: 753859
Implantable Lead Location: 753860
Implantable Lead Model: 4136
Implantable Lead Model: 4137
Implantable Lead Serial Number: 29021711
Implantable Lead Serial Number: 29201221
Implantable Pulse Generator Implant Date: 20130708
Lead Channel Impedance Value: 582 Ohm
Lead Channel Impedance Value: 609 Ohm
Lead Channel Pacing Threshold Amplitude: 0.4 V
Lead Channel Pacing Threshold Pulse Width: 0.4 ms
Lead Channel Setting Pacing Amplitude: 2 V
Lead Channel Setting Pacing Amplitude: 2.4 V
Lead Channel Setting Pacing Pulse Width: 1 ms
Lead Channel Setting Sensing Sensitivity: 2.5 mV
Pulse Gen Serial Number: 112155

## 2022-02-17 NOTE — Progress Notes (Signed)
Remote pacemaker transmission.   

## 2022-02-26 DIAGNOSIS — E782 Mixed hyperlipidemia: Secondary | ICD-10-CM | POA: Diagnosis not present

## 2022-02-26 DIAGNOSIS — I480 Paroxysmal atrial fibrillation: Secondary | ICD-10-CM | POA: Diagnosis not present

## 2022-02-26 DIAGNOSIS — I10 Essential (primary) hypertension: Secondary | ICD-10-CM | POA: Diagnosis not present

## 2022-02-26 DIAGNOSIS — R7303 Prediabetes: Secondary | ICD-10-CM | POA: Diagnosis not present

## 2022-04-27 ENCOUNTER — Ambulatory Visit (INDEPENDENT_AMBULATORY_CARE_PROVIDER_SITE_OTHER): Payer: PPO

## 2022-04-27 DIAGNOSIS — I495 Sick sinus syndrome: Secondary | ICD-10-CM

## 2022-04-27 LAB — CUP PACEART REMOTE DEVICE CHECK
Battery Remaining Longevity: 24 mo
Battery Remaining Percentage: 28 %
Brady Statistic RA Percent Paced: 98 %
Brady Statistic RV Percent Paced: 20 %
Date Time Interrogation Session: 20231129014100
Implantable Lead Connection Status: 753985
Implantable Lead Connection Status: 753985
Implantable Lead Implant Date: 20130708
Implantable Lead Implant Date: 20130708
Implantable Lead Location: 753859
Implantable Lead Location: 753860
Implantable Lead Model: 4136
Implantable Lead Model: 4137
Implantable Lead Serial Number: 29021711
Implantable Lead Serial Number: 29201221
Implantable Pulse Generator Implant Date: 20130708
Lead Channel Impedance Value: 572 Ohm
Lead Channel Impedance Value: 594 Ohm
Lead Channel Pacing Threshold Amplitude: 0.4 V
Lead Channel Pacing Threshold Pulse Width: 0.4 ms
Lead Channel Setting Pacing Amplitude: 2 V
Lead Channel Setting Pacing Amplitude: 2.4 V
Lead Channel Setting Pacing Pulse Width: 1 ms
Lead Channel Setting Sensing Sensitivity: 2.5 mV
Pulse Gen Serial Number: 112155
Zone Setting Status: 755011

## 2022-05-25 NOTE — Progress Notes (Signed)
Remote pacemaker transmission.   

## 2022-06-28 DIAGNOSIS — I10 Essential (primary) hypertension: Secondary | ICD-10-CM | POA: Diagnosis not present

## 2022-06-28 DIAGNOSIS — R7303 Prediabetes: Secondary | ICD-10-CM | POA: Diagnosis not present

## 2022-06-28 DIAGNOSIS — D692 Other nonthrombocytopenic purpura: Secondary | ICD-10-CM | POA: Diagnosis not present

## 2022-06-28 DIAGNOSIS — I700 Atherosclerosis of aorta: Secondary | ICD-10-CM | POA: Diagnosis not present

## 2022-06-28 DIAGNOSIS — M150 Primary generalized (osteo)arthritis: Secondary | ICD-10-CM | POA: Diagnosis not present

## 2022-06-28 DIAGNOSIS — I480 Paroxysmal atrial fibrillation: Secondary | ICD-10-CM | POA: Diagnosis not present

## 2022-06-28 DIAGNOSIS — E782 Mixed hyperlipidemia: Secondary | ICD-10-CM | POA: Diagnosis not present

## 2022-06-28 DIAGNOSIS — D6869 Other thrombophilia: Secondary | ICD-10-CM | POA: Diagnosis not present

## 2022-06-28 DIAGNOSIS — Z0000 Encounter for general adult medical examination without abnormal findings: Secondary | ICD-10-CM | POA: Diagnosis not present

## 2022-06-28 DIAGNOSIS — Z23 Encounter for immunization: Secondary | ICD-10-CM | POA: Diagnosis not present

## 2022-06-28 DIAGNOSIS — F320 Major depressive disorder, single episode, mild: Secondary | ICD-10-CM | POA: Diagnosis not present

## 2022-07-06 DIAGNOSIS — Z0000 Encounter for general adult medical examination without abnormal findings: Secondary | ICD-10-CM | POA: Diagnosis not present

## 2022-07-06 DIAGNOSIS — R7303 Prediabetes: Secondary | ICD-10-CM | POA: Diagnosis not present

## 2022-07-06 DIAGNOSIS — E782 Mixed hyperlipidemia: Secondary | ICD-10-CM | POA: Diagnosis not present

## 2022-07-06 DIAGNOSIS — N182 Chronic kidney disease, stage 2 (mild): Secondary | ICD-10-CM | POA: Diagnosis not present

## 2022-07-06 DIAGNOSIS — D6869 Other thrombophilia: Secondary | ICD-10-CM | POA: Diagnosis not present

## 2022-07-06 DIAGNOSIS — F320 Major depressive disorder, single episode, mild: Secondary | ICD-10-CM | POA: Diagnosis not present

## 2022-07-06 DIAGNOSIS — I10 Essential (primary) hypertension: Secondary | ICD-10-CM | POA: Diagnosis not present

## 2022-07-06 DIAGNOSIS — I700 Atherosclerosis of aorta: Secondary | ICD-10-CM | POA: Diagnosis not present

## 2022-07-06 DIAGNOSIS — M150 Primary generalized (osteo)arthritis: Secondary | ICD-10-CM | POA: Diagnosis not present

## 2022-07-06 DIAGNOSIS — D692 Other nonthrombocytopenic purpura: Secondary | ICD-10-CM | POA: Diagnosis not present

## 2022-07-06 DIAGNOSIS — I480 Paroxysmal atrial fibrillation: Secondary | ICD-10-CM | POA: Diagnosis not present

## 2022-07-06 DIAGNOSIS — I495 Sick sinus syndrome: Secondary | ICD-10-CM | POA: Diagnosis not present

## 2022-07-07 ENCOUNTER — Ambulatory Visit: Payer: PPO | Attending: Cardiovascular Disease | Admitting: Cardiovascular Disease

## 2022-07-07 ENCOUNTER — Encounter: Payer: Self-pay | Admitting: Cardiovascular Disease

## 2022-07-07 VITALS — BP 110/74 | HR 73 | Ht 72.0 in | Wt 230.6 lb

## 2022-07-07 DIAGNOSIS — I495 Sick sinus syndrome: Secondary | ICD-10-CM | POA: Diagnosis not present

## 2022-07-07 DIAGNOSIS — R7303 Prediabetes: Secondary | ICD-10-CM

## 2022-07-07 DIAGNOSIS — D6869 Other thrombophilia: Secondary | ICD-10-CM | POA: Diagnosis not present

## 2022-07-07 DIAGNOSIS — E669 Obesity, unspecified: Secondary | ICD-10-CM

## 2022-07-07 DIAGNOSIS — I4729 Other ventricular tachycardia: Secondary | ICD-10-CM

## 2022-07-07 DIAGNOSIS — I480 Paroxysmal atrial fibrillation: Secondary | ICD-10-CM

## 2022-07-07 DIAGNOSIS — Z01812 Encounter for preprocedural laboratory examination: Secondary | ICD-10-CM

## 2022-07-07 DIAGNOSIS — I10 Essential (primary) hypertension: Secondary | ICD-10-CM

## 2022-07-07 DIAGNOSIS — T82111A Breakdown (mechanical) of cardiac pulse generator (battery), initial encounter: Secondary | ICD-10-CM | POA: Diagnosis not present

## 2022-07-07 NOTE — Patient Instructions (Addendum)
Implantable Device Instructions    PRINCETON NABOR  07/07/2022  You are scheduled for a PPM generator change on Thursday, March 21 with Dr. Dani Gobble Croitoru.  1. Pre procedure Lab testing: You will come to the [facility] lab for your pre-procedure blood work.  These are walk in labs- you will not need an appointment and you do not need to be fasting. The lab is open from 8-4:30 pm.  The lab is closed from 1-2 pm for lunch.   2. Please arrive at the Main Entrance A at [facility]: [place], [place] [address] on March 21 at 1:30 PM (This time is two hours before your procedure to ensure your preparation). Free valet parking service is available. You will check in at ADMITTING. The support person will be asked to wait in the waiting room.  It is OK to have someone drop you off and come back when you are ready to be discharged.        Special note: Every effort is made to have your procedure done on time. Please understand that emergencies sometimes delay  scheduled procedures.  3.  No eating or drinking after midnight prior to procedure.     4.  Medication instructions:  hold your Xarelto (Rivaroxaban) for 2 day(s) prior to your procedure. Your last dose will be Monday, March 18, AM dose.     5.  The night before your procedure and the morning of your procedure scrub your neck/chest with CHG surgical scrub.  See instruction letter.  6. Plan to go home the same day, you will only stay overnight if medically necessary. 7. You MUST have a responsible adult to drive you home. 8. An adult MUST be with you the first 24 hours after you arrive home. 9. Bring a current list of your medications, and the last time and date medication taken. 10. Bring ID and current insurance cards. 11. Please wear clothes that are easy to get on and off and wear slip-on shoes.    You will follow up with the [place] clinic 10-14 days after your procedure.  You will follow up with Dr.  Dani Gobble Croitoru 91 days after your procedure.  These appointments will be made for you.   * If you have ANY questions after you get home, please call the office at [phone] or send a MyChart message.  FYI: For your safety, and to allow [place] to monitor your vital signs accurately during the surgery/procedure we request that if you have artificial nails, gel coating, SNS etc. Please have those removed prior to your surgery/procedure. Not having the nail coverings /polish removed may result in cancellation or delay of your surgery/procedure.    [facility] - Preparing For Surgery    Before surgery, you can play an important role. Because skin is not sterile, your skin needs to be as free of germs as possible. You can reduce the number of germs on your skin by washing with CHG (chlorahexidine gluconate) Soap before surgery.  CHG is an antiseptic cleaner which kills germs and bonds with the skin to continue killing germs even after washing.  Please do not use if you have an allergy to CHG or antibacterial soaps.  If your skin becomes reddened/irritated stop using the CHG.   Do not shave (including legs and underarms) for at least 48 hours prior to first CHG shower.  It is OK to shave your face.  Please follow these  instructions carefully:  1.  Shower the night before surgery and the morning of surgery with CHG.  2.  If you choose to wash your hair, wash your hair first as usual with your normal shampoo.  3.  After you shampoo, rinse your hair and body thoroughly to remove the shampoo.  4.  Use CHG as you would any other liquid soap.  You can apply CHG directly to the skin and wash gently with a clean washcloth. 5.  Apply the CHG Soap to your body ONLY FROM THE NECK DOWN.  Do not use on open wounds or open sores.  Avoid contact with your eyes, ears, mouth and genitals (private parts).  Wash genitals (private parts) with your normal soap.  6.  Wash thoroughly, paying special attention to the area  where your surgery will be performed.  7.  Thoroughly rinse your body with warm water from the neck down.   8.  DO NOT shower/wash with your normal soap after using and rinsing off the CHG soap.  9.  Pat yourself dry with a clean towel.           10.  Wear clean pajamas.           11.  Place clean sheets on your bed the night of your first shower and do not sleep with pets.  Day of Surgery: Do not apply any deodorants/lotions.  Please wear clean clothes to the hospital/surgery center.

## 2022-07-07 NOTE — Progress Notes (Signed)
Cardiology Office Note    Date:  07/07/2022   ID:  George Wolf, George Wolf [birth year], MRN [id number]  PCP:  Merrilee Seashore, MD  Cardiologist:   Sanda Klein, MD   Chief Complaint  Patient presents with   Pacemaker Problem    History of Present Illness:  George Wolf is a 77 y.o. male with HTN, paroxysmal atrial fibrillation and symptomatic sinus node dysfunction who received a dual-chamber permanent pacemaker in 2013 and returns for follow-up. He does not have significant structural heart disease.   The patient specifically denies any chest pain at rest exertion, dyspnea at rest or with exertion, orthopnea, paroxysmal nocturnal dyspnea, syncope, palpitations, focal neurological deficits, intermittent claudication, lower extremity edema, unexplained weight gain, cough, hemoptysis or wheezing.  He has not had any falls, serious injuries or bleeding problems and is compliant with Xarelto anticoagulation.  Unfortunately, his [employer] advantage dual-chamber pacemaker is under advisory.  Potential for switching to "safety mode" during battery drain such as during telemetry or remote downloads.  Recommendation is for generator change out when the estimated generator longevity is under 24 months, which is where we are today.  Will schedule him for elective generator change out, first available.  This will occur before his next remote download.  Right now device function is normal.  Lead parameters are stable.  He is virtually always in atrial paced, ventricular sensed rhythm.  RA lead pacing threshold is very low.  For several years, he has had a relatively high pacing threshold on the RV lead, stable today at 1.2 V at 1.0 ms, but he has only required 8% ventricular pacing since his most recent download.  RV lead sensing and impedance are normal.  He has not had any recent atrial fibrillation.  Labs performed by Dr. Ashby Dawes just yesterday are all favorable with the exception of  the borderline hemoglobin A1c of 6.0%.  Lipid parameters are all in desirable range.  Hemoglobin is normal.    Past Medical History:  Diagnosis Date   AF (paroxysmal atrial fibrillation) ([place])    Arthritis    Cancer ([place])    SKIN CANCER   Depression    Hypertension    Pacemaker 11/2011   [employer] for SSS   Sinus node dysfunction [facility])    [employer] pacemaker 12/05/11    Past Surgical History:  Procedure Laterality Date   FINGER SURGERY     LEFT THUMB   11/2016   INSERTION OF MESH N/A 05/31/2017   Procedure: INSERTION OF MESH;  Surgeon: Donnie Mesa, MD;  Location: [facility];  Service: General;  Laterality: N/A;   PERMANENT PACEMAKER INSERTION  12/05/2011   [employer]   PERMANENT PACEMAKER INSERTION N/A 12/05/2011   Procedure: PERMANENT PACEMAKER INSERTION;  Surgeon: Sanda Klein, MD;  Location: [facility] CATH LAB;  Service: Cardiovascular;  Laterality: N/A;   REPLACEMENT TOTAL KNEE  03/23/2009   right   TONSILLECTOMY     TOTAL KNEE ARTHROPLASTY Left 03/16/2020   Procedure: TOTAL KNEE ARTHROPLASTY;  Surgeon: Gaynelle Arabian, MD;  Location: [facility];  Service: Orthopedics;  Laterality: Left;  93ATF   UMBILICAL HERNIA REPAIR N/A 05/31/2017   Procedure: HERNIA REPAIR UMBILICAL ERAS PATHWAY;  Surgeon: Donnie Mesa, MD;  Location: [facility];  Service: General;  Laterality: N/A;  LMA ONLY   US ECHOCARDIOGRAPHY  11/01/2011   mildly dilated LA    Current Medications: Outpatient Medications Prior to Visit  Medication Sig Dispense Refill   allopurinol (ZYLOPRIM) 100 MG  tablet Take 100 mg by mouth daily.  9   atorvastatin (LIPITOR) 20 MG tablet Take 20 mg by mouth every evening.   2   benazepril (LOTENSIN) 20 MG tablet Take 20 mg by mouth daily.     buPROPion ER (WELLBUTRIN SR) 100 MG 12 hr tablet Take 100 mg by mouth in the morning and at bedtime.     clonazePAM (KLONOPIN) 0.5 MG tablet Take 0.5 mg by mouth daily as needed for anxiety.     fluticasone (FLONASE) 50 MCG/ACT nasal  spray Place 1 spray into both nostrils daily as needed for allergies.      hydrochlorothiazide (HYDRODIURIL) 25 MG tablet Take 25 mg by mouth daily.     pantoprazole (PROTONIX) 20 MG tablet Take 20 mg by mouth daily.     rivaroxaban (XARELTO) 20 MG TABS tablet TAKE 1 TABLET(20 MG) BY MOUTH DAILY WITH DINNER 90 tablet 3   sertraline (ZOLOFT) 100 MG tablet Take 100 mg by mouth daily.      clonazePAM (KLONOPIN) 0.5 MG tablet Take 1 tablet by mouth 2 (two) times daily as needed. (Patient not taking: Reported on 07/07/2022)     fexofenadine (ALLEGRA) 180 MG tablet Take 180 mg by mouth daily as needed for allergies.  (Patient not taking: Reported on 07/07/2022)     No facility-administered medications prior to visit.     Allergies:   Penicillins   Social History   Socioeconomic History   Marital status: Married    Spouse name: Not on file   Number of children: 2   Years of education: Not on file   Highest education level: Not on file  Occupational History   Not on file  Tobacco Use   Smoking status: Never   Smokeless tobacco: Never  Vaping Use   Vaping Use: Never used  Substance and Sexual Activity   Alcohol use: Yes    Comment: OCC WINE    Drug use: No   Sexual activity: Yes    Partners: Female  Other Topics Concern   Not on file  Social History Narrative   Not on file   Social Determinants of Health   Financial Resource Strain: Not on file  Food Insecurity: Not on file  Transportation Needs: Not on file  Physical Activity: Not on file  Stress: Not on file  Social Connections: Not on file     Family History:  The patient's family history includes Diabetes in his father.   ROS:   Please see the history of present illness.    ROS All other systems are reviewed and are negative.   PHYSICAL EXAM:   VS:  BP 110/74 (BP Location: Left Arm, Patient Position: Sitting, Cuff Size: Large)   Pulse 73   Ht 6' (1.829 m)   Wt 230 lb 9.6 oz (104.6 kg)   SpO2 96%   BMI 31.27 kg/m       General: Alert, oriented x3, no distress, mildly obese.  Healthy left subclavian pacemaker site. Head: no evidence of trauma, PERRL, EOMI, no exophtalmos or lid lag, no myxedema, no xanthelasma; normal ears, nose and oropharynx Neck: normal jugular venous pulsations and no hepatojugular reflux; brisk carotid pulses without delay and no carotid bruits Chest: clear to auscultation, no signs of consolidation by percussion or palpation, normal fremitus, symmetrical and full respiratory excursions Cardiovascular: normal position and quality of the apical impulse, regular rhythm, normal first and second heart sounds, no murmurs, rubs or gallops Abdomen: no tenderness or distention,  no masses by palpation, no abnormal pulsatility or arterial bruits, normal bowel sounds, no hepatosplenomegaly Extremities: no clubbing, cyanosis or edema; 2+ radial, ulnar and brachial pulses bilaterally; 2+ right femoral, posterior tibial and dorsalis pedis pulses; 2+ left femoral, posterior tibial and dorsalis pedis pulses; no subclavian or femoral bruits Neurological: grossly nonfocal Psych: Normal mood and affect      Wt Readings from Last 3 Encounters:  07/07/22 230 lb 9.6 oz (104.6 kg)  01/17/22 226 lb (102.5 kg)  01/11/21 221 lb 6.4 oz (100.4 kg)      Studies/Labs Reviewed:   EKG:  EKG is ordered today.  It shows atrial paced, ventricular sensed rhythm with prolonged AV delay 248 ms, QRS just under 100 ms.  No ischemic changes, QTc 467 ms.  BMET    Component Value Date/Time   NA 137 03/17/2020 0335   K 4.0 03/17/2020 0335   CL 100 03/17/2020 0335   CO2 27 03/17/2020 0335   GLUCOSE 133 (H) 03/17/2020 0335   BUN 20 03/17/2020 0335   CREATININE 0.99 03/17/2020 0335   CALCIUM 8.6 (L) 03/17/2020 0335   GFRNONAA >60 03/17/2020 0335   GFRAA >60 05/25/2017 1253   09/24/2020 Creatinine 0.97, TSH   Lipid Panel  No results found for: "CHOL", "TRIG", "HDL", "CHOLHDL", "VLDL", "LDLCALC", "LDLDIRECT",  "LABVLDL"  09/24/2020 Cholesterol 140, HDL 55, LDL 80, TG 57  07/06/2022 Hemoglobin 13.4, hemoglobin A1c 6.0%, TSH 2.63, normal electrolytes, creatinine 0.9 Cholesterol 155, HDL 55, LDL 78, triglycerides 59.   ASSESSMENT:    1. Paroxysmal atrial fibrillation (HCC)   2. Acquired thrombophilia ([place])   3. SSS (sick sinus syndrome) ([place])   4. Pacemaker malfunction, initial encounter   5. NSVT (nonsustained ventricular tachycardia) (HCC)   6. Essential hypertension   7. Obesity, mild   8. Prediabetes   9. Pre-procedure lab exam      PLAN:  In order of problems listed above:  AFib: Very low prevalence and asymptomatic.  CHA2DS2-VASc 3 (age 35, hypertension). Anticoagulation: No bleeding complications.  Will hold Xarelto for 2 days before the pacemaker generator change. SSS: Heart rate histogram distribution appears better since the last changes to his accelerometer sensor. PPM: Device generator under advisory.  Will schedule for generator change out before his next remote download.  He is not pacemaker dependent. This procedure has been fully reviewed with the patient and written informed consent has been obtained. NSVT: These are very rare, occurring only about once a year and have been consistently symptomatic.  None is seen on the current download. HTN: Well-controlled. Obesity/prediabetes: Unfortunately he has gained a few more pounds.  Now in full obesity range.  Hemoglobin A1c also slightly higher, but remains in prediabetes range.  Recommend weight loss, reducing the intake of sweets and carbohydrates with high glycemic index.    Medication Adjustments/Labs and Tests Ordered: Current medicines are reviewed at length with the patient today.  Concerns regarding medicines are outlined above.  Medication changes, Labs and Tests ordered today are listed in the Patient Instructions below. Patient Instructions      Implantable Device Instructions    George Wolf  07/07/2022  You are scheduled for a PPM generator change on Thursday, March 21 with Dr. Dani Gobble .  1. Pre procedure Lab testing: You will come to the [facility] lab for your pre-procedure blood work.  These are walk in labs- you will not need an appointment and you do not need to be fasting. The lab is open from  8-4:30 pm.  The lab is closed from 1-2 pm for lunch.   2. Please arrive at the Main Entrance A at [facility]: [place],  [address] on March 21 at 1:30 PM (This time is two hours before your procedure to ensure your preparation). Free valet parking service is available. You will check in at ADMITTING. The support person will be asked to wait in the waiting room.  It is OK to have someone drop you off and come back when you are ready to be discharged.        Special note: Every effort is made to have your procedure done on time. Please understand that emergencies sometimes delay  scheduled procedures.  3.  No eating or drinking after midnight prior to procedure.     4.  Medication instructions:  hold your Xarelto (Rivaroxaban) for 2 day(s) prior to your procedure. Your last dose will be Monday, March 18, AM dose.     5.  The night before your procedure and the morning of your procedure scrub your neck/chest with CHG surgical scrub.  See instruction letter.  6. Plan to go home the same day, you will only stay overnight if medically necessary. 7. You MUST have a responsible adult to drive you home. 8. An adult MUST be with you the first 24 hours after you arrive home. 9. Bring a current list of your medications, and the last time and date medication taken. 10. Bring ID and current insurance cards. 11. Please wear clothes that are easy to get on and off and wear slip-on shoes.    You will follow up with the [place] clinic 10-14 days after your procedure.  You will follow up with Dr. Dani Gobble  91 days after your procedure.  These  appointments will be made for you.   * If you have ANY questions after you get home, please call the office at [phone] or send a MyChart message.  FYI: For your safety, and to allow [place] to monitor your vital signs accurately during the surgery/procedure we request that if you have artificial nails, gel coating, SNS etc. Please have those removed prior to your surgery/procedure. Not having the nail coverings /polish removed may result in cancellation or delay of your surgery/procedure.    [facility] - Preparing For Surgery    Before surgery, you can play an important role. Because skin is not sterile, your skin needs to be as free of germs as possible. You can reduce the number of germs on your skin by washing with CHG (chlorahexidine gluconate) Soap before surgery.  CHG is an antiseptic cleaner which kills germs and bonds with the skin to continue killing germs even after washing.  Please do not use if you have an allergy to CHG or antibacterial soaps.  If your skin becomes reddened/irritated stop using the CHG.   Do not shave (including legs and underarms) for at least 48 hours prior to first CHG shower.  It is OK to shave your face.  Please follow these instructions carefully:  1.  Shower the night before surgery and the morning of surgery with CHG.  2.  If you choose to wash your hair, wash your hair first as usual with your normal shampoo.  3.  After you shampoo, rinse your hair and body thoroughly to remove the shampoo.  4.  Use CHG as you would any other liquid soap.  You can apply CHG directly to the  skin and wash gently with a clean washcloth. 5.  Apply the CHG Soap to your body ONLY FROM THE NECK DOWN.  Do not use on open wounds or open sores.  Avoid contact with your eyes, ears, mouth and genitals (private parts).  Wash genitals (private parts) with your normal soap.  6.  Wash thoroughly, paying special attention to the area where your surgery will be performed.  7.  Thoroughly  rinse your body with warm water from the neck down.   8.  DO NOT shower/wash with your normal soap after using and rinsing off the CHG soap.  9.  Pat yourself dry with a clean towel.           10.  Wear clean pajamas.           11.  Place clean sheets on your bed the night of your first shower and do not sleep with pets.  Day of Surgery: Do not apply any deodorants/lotions.  Please wear clean clothes to the hospital/surgery center.    Signed, Sanda Klein, MD  07/07/2022 9:28 AM    [facility] [place], [place], [place]  [address] Phone: [phone]; Fax: [phone]

## 2022-07-27 ENCOUNTER — Ambulatory Visit: Payer: PPO

## 2022-07-27 ENCOUNTER — Ambulatory Visit (INDEPENDENT_AMBULATORY_CARE_PROVIDER_SITE_OTHER): Payer: PPO | Admitting: Cardiovascular Disease

## 2022-07-27 ENCOUNTER — Encounter: Payer: Self-pay | Admitting: Cardiovascular Disease

## 2022-07-27 VITALS — BP 120/78 | HR 68 | Ht 72.0 in | Wt 230.0 lb

## 2022-07-27 DIAGNOSIS — I495 Sick sinus syndrome: Secondary | ICD-10-CM | POA: Diagnosis not present

## 2022-07-27 DIAGNOSIS — T82111A Breakdown (mechanical) of cardiac pulse generator (battery), initial encounter: Secondary | ICD-10-CM | POA: Diagnosis not present

## 2022-07-27 DIAGNOSIS — I480 Paroxysmal atrial fibrillation: Secondary | ICD-10-CM | POA: Diagnosis not present

## 2022-07-27 LAB — CUP PACEART REMOTE DEVICE CHECK
Battery Remaining Longevity: 18 mo
Battery Remaining Percentage: 21 %
Brady Statistic RA Percent Paced: 98 %
Brady Statistic RV Percent Paced: 21 %
Date Time Interrogation Session: 20240228014100
Implantable Lead Connection Status: 753985
Implantable Lead Connection Status: 753985
Implantable Lead Implant Date: 20130708
Implantable Lead Implant Date: 20130708
Implantable Lead Location: 753859
Implantable Lead Location: 753860
Implantable Lead Model: 4136
Implantable Lead Model: 4137
Implantable Lead Serial Number: 29021711
Implantable Lead Serial Number: 29201221
Implantable Pulse Generator Implant Date: 20130708
Lead Channel Impedance Value: 575 Ohm
Lead Channel Impedance Value: 601 Ohm
Lead Channel Pacing Threshold Amplitude: 0.4 V
Lead Channel Pacing Threshold Pulse Width: 0.4 ms
Lead Channel Setting Pacing Amplitude: 2 V
Lead Channel Setting Pacing Amplitude: 2.4 V
Lead Channel Setting Pacing Pulse Width: 1 ms
Lead Channel Setting Sensing Sensitivity: 2.5 mV
Pulse Gen Serial Number: 112155
Zone Setting Status: 755011

## 2022-07-27 NOTE — H&P (View-Only) (Signed)
Cardiologist:    , MD     Virtual Visit via Telephone Note   Because of George Wolf's co-morbid illnesses, he is at least at moderate risk for complications without adequate follow up.  This format is felt to be most appropriate for this patient at this time.  The patient did not have access to video technology/had technical difficulties with video requiring transitioning to audio format only (telephone).  All issues noted in this document were discussed and addressed.  No physical exam could be performed with this format.  Please refer to the patient's chart for his consent to telehealth for [facility].    Date:  07/27/2022   ID:  George Wolf, DOB 01/12/1946, MRN [id number] The patient was identified using 2 identifiers.  Patient Location: Home Provider Location: Office/Clinic   PCP:  Ramachandran, Ajith, MD   [facility] Providers Cardiologist:   , MD     Evaluation Performed:  Follow-Up Visit  Chief Complaint: Pacemaker generator change out planning   History of Present Illness:  George Wolf is a 77 y.o. male with HTN, paroxysmal atrial fibrillation and symptomatic sinus node dysfunction who received a dual-chamber permanent pacemaker in 2013 and returns for follow-up. He does not have significant structural heart disease.   The patient specifically denies any chest pain at rest exertion, dyspnea at rest or with exertion, orthopnea, paroxysmal nocturnal dyspnea, syncope, palpitations, focal neurological deficits, intermittent claudication, lower extremity edema, unexplained weight gain, cough, hemoptysis or wheezing.  He has not had any falls, serious injuries or bleeding problems and is compliant with Xarelto anticoagulation.  Unfortunately, his [employer] advantage dual-chamber pacemaker is under advisory.  Potential for switching to "safety mode" during battery drain such as during telemetry or remote downloads.   Recommendation is for generator change out when the estimated generator longevity is under 24 months, which is where we are today.  Will schedule him for elective generator change out, first available.  This will occur before his next remote download.  Right now device function is normal.  Lead parameters are stable.  He is virtually always in atrial paced, ventricular sensed rhythm.  RA lead pacing threshold is very low.  For several years, he has had a relatively high pacing threshold on the RV lead, stable today at 1.2 V at 1.0 ms, but he has only required 8% ventricular pacing since his most recent download.  RV lead sensing and impedance are normal.  He has not had any recent atrial fibrillation.  Labs performed by Dr. Ramachandran just yesterday are all favorable with the exception of the borderline hemoglobin A1c of 6.0%.  Lipid parameters are all in desirable range.  Hemoglobin is normal.    Past Medical History:  Diagnosis Date   AF (paroxysmal atrial fibrillation) (HCC)    Arthritis    Cancer (HCC)    SKIN CANCER   Depression    Hypertension    Pacemaker 11/2011   [employer] for SSS   Sinus node dysfunction (HCC)    [employer] pacemaker 12/05/11    Past Surgical History:  Procedure Laterality Date   FINGER SURGERY     LEFT THUMB   11/2016   INSERTION OF MESH N/A 05/31/2017   Procedure: INSERTION OF MESH;  Surgeon: Tsuei, Matthew, MD;  Location: [facility] OR;  Service: General;  Laterality: N/A;   PERMANENT PACEMAKER INSERTION  12/05/2011   [employer]   PERMANENT PACEMAKER INSERTION N/A 12/05/2011   Procedure:   PERMANENT PACEMAKER INSERTION;  Surgeon:  , MD;  Location: [facility] CATH LAB;  Service: Cardiovascular;  Laterality: N/A;   REPLACEMENT TOTAL KNEE  03/23/2009   right   TONSILLECTOMY     TOTAL KNEE ARTHROPLASTY Left 03/16/2020   Procedure: TOTAL KNEE ARTHROPLASTY;  Surgeon: Aluisio, Frank, MD;  Location: [facility] ORS;  Service: Orthopedics;  Laterality: Left;   50min   UMBILICAL HERNIA REPAIR N/A 05/31/2017   Procedure: HERNIA REPAIR UMBILICAL ERAS PATHWAY;  Surgeon: Tsuei, Matthew, MD;  Location: [facility] OR;  Service: General;  Laterality: N/A;  LMA ONLY   US ECHOCARDIOGRAPHY  11/01/2011   mildly dilated LA    Current Medications: Outpatient Medications Prior to Visit  Medication Sig Dispense Refill   allopurinol (ZYLOPRIM) 100 MG tablet Take 100 mg by mouth daily.  9   atorvastatin (LIPITOR) 20 MG tablet Take 20 mg by mouth every evening.   2   benazepril (LOTENSIN) 20 MG tablet Take 20 mg by mouth daily.     buPROPion ER (WELLBUTRIN SR) 100 MG 12 hr tablet Take 100 mg by mouth in the morning and at bedtime.     clonazePAM (KLONOPIN) 0.5 MG tablet Take 0.5 mg by mouth daily as needed for anxiety.     fexofenadine (ALLEGRA) 180 MG tablet Take 180 mg by mouth daily as needed for allergies.     hydrochlorothiazide (HYDRODIURIL) 25 MG tablet Take 25 mg by mouth daily.     pantoprazole (PROTONIX) 20 MG tablet Take 20 mg by mouth daily.     rivaroxaban (XARELTO) 20 MG TABS tablet TAKE 1 TABLET(20 MG) BY MOUTH DAILY WITH DINNER 90 tablet 3   sertraline (ZOLOFT) 100 MG tablet Take 100 mg by mouth daily.      fluticasone (FLONASE) 50 MCG/ACT nasal spray Place 1 spray into both nostrils daily as needed for allergies.  (Patient not taking: Reported on 07/27/2022)     No facility-administered medications prior to visit.     Allergies:   Penicillins   Social History   Socioeconomic History   Marital status: Married    Spouse name: Not on file   Number of children: 2   Years of education: Not on file   Highest education level: Not on file  Occupational History   Not on file  Tobacco Use   Smoking status: Never   Smokeless tobacco: Never  Vaping Use   Vaping Use: Never used  Substance and Sexual Activity   Alcohol use: Yes    Comment: OCC WINE    Drug use: No   Sexual activity: Yes    Partners: Female  Other Topics Concern   Not on file  Social  History Narrative   Not on file   Social Determinants of Health   Financial Resource Strain: Not on file  Food Insecurity: Not on file  Transportation Needs: Not on file  Physical Activity: Not on file  Stress: Not on file  Social Connections: Not on file     Family History:  The patient's family history includes Diabetes in his father.   ROS:   Please see the history of present illness.    ROS All other systems are reviewed and are negative.   Objective:    Vital Signs:  BP 120/78 (BP Location: Left Arm, Patient Position: Sitting, Cuff Size: Normal)   Pulse 68   Ht 6' (1.829 m)   Wt 230 lb (104.3 kg)   BMI 31.19 kg/m      ASSESSMENT &   PLAN:      PLAN:  In order of problems listed above:  AFib: Very low prevalence and asymptomatic.  CHA2DS2-VASc 3 (age 73, hypertension). Anticoagulation: No bleeding complications.  Will hold Xarelto for 2 days before the pacemaker generator change. SSS: Heart rate histogram distribution appears better since the last changes to his accelerometer sensor. PPM: Device generator under advisory.  Will schedule for generator change out before his next remote download.  He is not pacemaker dependent. This procedure has been fully reviewed with the patient and written informed consent has been obtained. NSVT: These are very rare, occurring only about once a year and have been consistently symptomatic.  None is seen on the current download. HTN: Well-controlled. Obesity/prediabetes: Unfortunately he has gained a few more pounds.  Now in full obesity range.  Hemoglobin A1c also slightly higher, but remains in prediabetes range.  Recommend weight loss, reducing the intake of sweets and carbohydrates with high glycemic index.    Signed,  , MD  07/27/2022 8:10 AM    [facility] [address], [place], [place]  [address] Phone: [phone]; Fax: [phone]   

## 2022-07-27 NOTE — Progress Notes (Signed)
Cardiologist:   Sanda Klein, MD     Virtual Visit via Telephone Note   Because of George Wolf's co-morbid illnesses, he is at least at moderate risk for complications without adequate follow up.  This format is felt to be most appropriate for this patient at this time.  The patient did not have access to video technology/had technical difficulties with video requiring transitioning to audio format only (telephone).  All issues noted in this document were discussed and addressed.  No physical exam could be performed with this format.  Please refer to the patient's chart for his consent to telehealth for George Wolf.    Date:  07/27/2022   ID:  George Wolf December 29, 1945, MRN [id number] The patient was identified using 2 identifiers.  Patient Location: Home Provider Location: Office/Clinic   PCP:  Merrilee Seashore, [place] Providers Cardiologist:  Sanda Klein, MD     Evaluation Performed:  Follow-Up Visit  Chief Complaint: Pacemaker generator change out planning   History of Present Illness:  [name] is a 77 y.o. male with HTN, paroxysmal atrial fibrillation and symptomatic sinus node dysfunction who received a dual-chamber permanent pacemaker in 2013 and returns for follow-up. He does not have significant structural heart disease.   The patient specifically denies any chest pain at rest exertion, dyspnea at rest or with exertion, orthopnea, paroxysmal nocturnal dyspnea, syncope, palpitations, focal neurological deficits, intermittent claudication, lower extremity edema, unexplained weight gain, cough, hemoptysis or wheezing.  He has not had any falls, serious injuries or bleeding problems and is compliant with Xarelto anticoagulation.  Unfortunately, his [employer] advantage dual-chamber pacemaker is under advisory.  Potential for switching to "safety mode" during battery drain such as during telemetry or remote downloads.   Recommendation is for generator change out when the estimated generator longevity is under 24 months, which is where we are today.  Will schedule him for elective generator change out, first available.  This will occur before his next remote download.  Right now device function is normal.  Lead parameters are stable.  He is virtually always in atrial paced, ventricular sensed rhythm.  RA lead pacing threshold is very low.  For several years, he has had a relatively high pacing threshold on the RV lead, stable today at 1.2 V at 1.0 ms, but he has only required 8% ventricular pacing since his most recent download.  RV lead sensing and impedance are normal.  He has not had any recent atrial fibrillation.  Labs performed by Dr. Ashby Dawes just yesterday are all favorable with the exception of the borderline hemoglobin A1c of 6.0%.  Lipid parameters are all in desirable range.  Hemoglobin is normal.    Past Medical History:  Diagnosis Date   AF (paroxysmal atrial fibrillation) ([place])    Arthritis    Cancer ([place])    SKIN CANCER   Depression    Hypertension    Pacemaker 11/2011   [employer] for SSS   Sinus node dysfunction [facility])    [employer] pacemaker 12/05/11    Past Surgical History:  Procedure Laterality Date   FINGER SURGERY     LEFT THUMB   11/2016   INSERTION OF MESH N/A 05/31/2017   Procedure: INSERTION OF MESH;  Surgeon: Donnie Mesa, MD;  Location: [place] [facility];  Service: General;  Laterality: N/A;   PERMANENT PACEMAKER INSERTION  12/05/2011   [employer]   PERMANENT PACEMAKER INSERTION N/A 12/05/2011   Procedure:  PERMANENT PACEMAKER INSERTION;  Surgeon: Sanda Klein, MD;  Location: [facility] CATH LAB;  Service: Cardiovascular;  Laterality: N/A;   REPLACEMENT TOTAL KNEE  03/23/2009   right   TONSILLECTOMY     TOTAL KNEE ARTHROPLASTY Left 03/16/2020   Procedure: TOTAL KNEE ARTHROPLASTY;  Surgeon: Gaynelle Arabian, MD;  Location: [facility];  Service: Orthopedics;  Laterality: Left;   [id number]   UMBILICAL HERNIA REPAIR N/A 05/31/2017   Procedure: HERNIA REPAIR UMBILICAL ERAS PATHWAY;  Surgeon: Donnie Mesa, MD;  Location: [facility];  Service: General;  Laterality: N/A;  LMA ONLY   US ECHOCARDIOGRAPHY  11/01/2011   mildly dilated LA    Current Medications: Outpatient Medications Prior to Visit  Medication Sig Dispense Refill   allopurinol (ZYLOPRIM) 100 MG tablet Take 100 mg by mouth daily.  9   atorvastatin (LIPITOR) 20 MG tablet Take 20 mg by mouth every evening.   2   benazepril (LOTENSIN) 20 MG tablet Take 20 mg by mouth daily.     buPROPion ER (WELLBUTRIN SR) 100 MG 12 hr tablet Take 100 mg by mouth in the morning and at bedtime.     clonazePAM (KLONOPIN) 0.5 MG tablet Take 0.5 mg by mouth daily as needed for anxiety.     fexofenadine (ALLEGRA) 180 MG tablet Take 180 mg by mouth daily as needed for allergies.     hydrochlorothiazide (HYDRODIURIL) 25 MG tablet Take 25 mg by mouth daily.     pantoprazole (PROTONIX) 20 MG tablet Take 20 mg by mouth daily.     rivaroxaban (XARELTO) 20 MG TABS tablet TAKE 1 TABLET(20 MG) BY MOUTH DAILY WITH DINNER 90 tablet 3   sertraline (ZOLOFT) 100 MG tablet Take 100 mg by mouth daily.      fluticasone (FLONASE) 50 MCG/ACT nasal spray Place 1 spray into both nostrils daily as needed for allergies.  (Patient not taking: Reported on 07/27/2022)     No facility-administered medications prior to visit.     Allergies:   Penicillins   Social History   Socioeconomic History   Marital status: Married    Spouse name: Not on file   Number of children: 2   Years of education: Not on file   Highest education level: Not on file  Occupational History   Not on file  Tobacco Use   Smoking status: Never   Smokeless tobacco: Never  Vaping Use   Vaping Use: Never used  Substance and Sexual Activity   Alcohol use: Yes    Comment: OCC WINE    Drug use: No   Sexual activity: Yes    Partners: Female  Other Topics Concern   Not on file  Social  History Narrative   Not on file   Social Determinants of Health   Financial Resource Strain: Not on file  Food Insecurity: Not on file  Transportation Needs: Not on file  Physical Activity: Not on file  Stress: Not on file  Social Connections: Not on file     Family History:  The patient's family history includes Diabetes in his father.   ROS:   Please see the history of present illness.    ROS All other systems are reviewed and are negative.   Objective:    Vital Signs:  BP 120/78 (BP Location: Left Arm, Patient Position: Sitting, Cuff Size: Normal)   Pulse 68   Ht 6' (1.829 m)   Wt 230 lb (104.3 kg)   BMI 31.19 kg/m      ASSESSMENT &  PLAN:      PLAN:  In order of problems listed above:  AFib: Very low prevalence and asymptomatic.  CHA2DS2-VASc 3 (age 43, hypertension). Anticoagulation: No bleeding complications.  Will hold Xarelto for 2 days before the pacemaker generator change. SSS: Heart rate histogram distribution appears better since the last changes to his accelerometer sensor. PPM: Device generator under advisory.  Will schedule for generator change out before his next remote download.  He is not pacemaker dependent. This procedure has been fully reviewed with the patient and written informed consent has been obtained. NSVT: These are very rare, occurring only about once a year and have been consistently symptomatic.  None is seen on the current download. HTN: Well-controlled. Obesity/prediabetes: Unfortunately he has gained a few more pounds.  Now in full obesity range.  Hemoglobin A1c also slightly higher, but remains in prediabetes range.  Recommend weight loss, reducing the intake of sweets and carbohydrates with high glycemic index.    Signed, Sanda Klein, MD  07/27/2022 8:10 AM    [facility] [place], [place], [place]  [address] Phone: [phone]; Fax: [phone]

## 2022-07-28 DIAGNOSIS — I480 Paroxysmal atrial fibrillation: Secondary | ICD-10-CM | POA: Diagnosis not present

## 2022-07-28 DIAGNOSIS — I10 Essential (primary) hypertension: Secondary | ICD-10-CM | POA: Diagnosis not present

## 2022-07-28 DIAGNOSIS — R7303 Prediabetes: Secondary | ICD-10-CM | POA: Diagnosis not present

## 2022-07-28 DIAGNOSIS — E782 Mixed hyperlipidemia: Secondary | ICD-10-CM | POA: Diagnosis not present

## 2022-08-01 ENCOUNTER — Telehealth: Payer: Self-pay | Admitting: Emergency Medicine

## 2022-08-01 NOTE — Telephone Encounter (Signed)
Spoke with patient to clarify that he is getting blood drawn/lab work before procedure 08/18/22. Pt states that he will come in either 3/14, 15, or 18th to get labs. He is also aware that he needs to stop taking Xarelto 2 days prior to procedure and he has the CHG wash to do on the night before and day of procedure.

## 2022-08-10 DIAGNOSIS — Z01812 Encounter for preprocedural laboratory examination: Secondary | ICD-10-CM | POA: Diagnosis not present

## 2022-08-10 DIAGNOSIS — I495 Sick sinus syndrome: Secondary | ICD-10-CM | POA: Diagnosis not present

## 2022-08-10 LAB — CBC

## 2022-08-11 LAB — BASIC METABOLIC PANEL
BUN/Creatinine Ratio: 26 — ABNORMAL HIGH (ref 10–24)
BUN: 24 mg/dL (ref 8–27)
CO2: 27 mmol/L (ref 20–29)
Calcium: 9.1 mg/dL (ref 8.6–10.2)
Chloride: 102 mmol/L (ref 96–106)
Creatinine, Ser: 0.92 mg/dL (ref 0.76–1.27)
Glucose: 101 mg/dL — ABNORMAL HIGH (ref 70–99)
Potassium: 4.2 mmol/L (ref 3.5–5.2)
Sodium: 140 mmol/L (ref 134–144)
eGFR: 85 mL/min/{1.73_m2} (ref >59)

## 2022-08-11 LAB — CBC
Hematocrit: 39.6 % (ref 37.5–51.0)
Hemoglobin: 13 g/dL (ref 13.0–17.7)
MCH: 31.6 pg (ref 26.6–33.0)
MCHC: 32.8 g/dL (ref 31.5–35.7)
MCV: 96 fL (ref 79–97)
Platelets: 169 10*3/uL (ref 150–450)
RBC: 4.11 x10E6/uL — ABNORMAL LOW (ref 4.14–5.80)
RDW: 12.6 % (ref 11.6–15.4)
WBC: 5.6 10*3/uL (ref 3.4–10.8)

## 2022-08-17 ENCOUNTER — Telehealth: Payer: Self-pay | Admitting: Emergency Medicine

## 2022-08-17 NOTE — Telephone Encounter (Signed)
Called patient to see if he could come to procedure early- 1130 due to a cancellation tomorrow. Pt is unable to come early due to transportation/support person

## 2022-08-18 ENCOUNTER — Ambulatory Visit (HOSPITAL_COMMUNITY)
Admission: RE | Admit: 2022-08-18 | Discharge: 2022-08-18 | Disposition: A | Discharge status: Home / Self Care | Payer: PPO | Attending: Cardiovascular Disease | Admitting: Cardiovascular Disease

## 2022-08-18 ENCOUNTER — Other Ambulatory Visit: Payer: Self-pay

## 2022-08-18 ENCOUNTER — Ambulatory Visit (HOSPITAL_COMMUNITY)
Admission: RE | Disposition: A | Discharge status: Home / Self Care | Payer: Self-pay | Source: Home / Self Care | Attending: Cardiovascular Disease

## 2022-08-18 DIAGNOSIS — I495 Sick sinus syndrome: Secondary | ICD-10-CM | POA: Insufficient documentation

## 2022-08-18 DIAGNOSIS — I480 Paroxysmal atrial fibrillation: Secondary | ICD-10-CM | POA: Insufficient documentation

## 2022-08-18 DIAGNOSIS — I10 Essential (primary) hypertension: Secondary | ICD-10-CM | POA: Insufficient documentation

## 2022-08-18 DIAGNOSIS — R7303 Prediabetes: Secondary | ICD-10-CM | POA: Diagnosis not present

## 2022-08-18 DIAGNOSIS — I472 Ventricular tachycardia, unspecified: Secondary | ICD-10-CM | POA: Insufficient documentation

## 2022-08-18 DIAGNOSIS — Z6831 Body mass index (BMI) 31.0-31.9, adult: Secondary | ICD-10-CM | POA: Diagnosis not present

## 2022-08-18 DIAGNOSIS — E669 Obesity, unspecified: Secondary | ICD-10-CM | POA: Diagnosis not present

## 2022-08-18 DIAGNOSIS — Z45010 Encounter for checking and testing of cardiac pacemaker pulse generator [battery]: Secondary | ICD-10-CM | POA: Diagnosis not present

## 2022-08-18 DIAGNOSIS — Z7901 Long term (current) use of anticoagulants: Secondary | ICD-10-CM | POA: Diagnosis not present

## 2022-08-18 DIAGNOSIS — T82191A Other mechanical complication of cardiac pulse generator (battery), initial encounter: Secondary | ICD-10-CM | POA: Diagnosis present

## 2022-08-18 HISTORY — PX: PPM GENERATOR CHANGEOUT: EP1233

## 2022-08-18 SURGERY — PPM GENERATOR CHANGEOUT

## 2022-08-18 MED ORDER — SODIUM CHLORIDE 0.9 % IV SOLN: INTRAVENOUS | Status: DC

## 2022-08-18 MED ORDER — SODIUM CHLORIDE 0.9 % IV SOLN
80.0000 mg | INTRAVENOUS | Status: AC
  Administered 2022-08-18: 80 mg

## 2022-08-18 MED ORDER — FENTANYL CITRATE (PF) 100 MCG/2ML IJ SOLN
INTRAMUSCULAR | Status: AC
  Filled 2022-08-18: qty 2

## 2022-08-18 MED ORDER — POVIDONE-IODINE 10 % EX SWAB
2.0000 | Freq: Once | CUTANEOUS | Status: AC
  Administered 2022-08-18: 2 via TOPICAL

## 2022-08-18 MED ORDER — SODIUM CHLORIDE 0.9 % IV SOLN
INTRAVENOUS | Status: AC
  Filled 2022-08-18: qty 2

## 2022-08-18 MED ORDER — ONDANSETRON HCL 4 MG/2ML IJ SOLN: 4.0000 mg | Freq: Four times a day (QID) | INTRAMUSCULAR | Status: DC | PRN

## 2022-08-18 MED ORDER — RIVAROXABAN 20 MG PO TABS: ORAL_TABLET | ORAL | 3 refills | Status: AC

## 2022-08-18 MED ORDER — CHLORHEXIDINE GLUCONATE 4 % EX LIQD
4.0000 | Freq: Once | CUTANEOUS | Status: DC
  Filled 2022-08-18: qty 60

## 2022-08-18 MED ORDER — LIDOCAINE HCL (PF) 1 % IJ SOLN
INTRAMUSCULAR | Status: DC | PRN
  Administered 2022-08-18: 60 mL via INTRADERMAL

## 2022-08-18 MED ORDER — ACETAMINOPHEN 325 MG PO TABS: 325.0000 mg | ORAL_TABLET | ORAL | Status: DC | PRN

## 2022-08-18 MED ORDER — VANCOMYCIN HCL 1500 MG/300ML IV SOLN
1500.0000 mg | INTRAVENOUS | Status: AC
  Administered 2022-08-18: 1500 mg via INTRAVENOUS
  Filled 2022-08-18: qty 300

## 2022-08-18 MED ORDER — SODIUM CHLORIDE 0.9% FLUSH: 3.0000 mL | INTRAVENOUS | Status: DC | PRN

## 2022-08-18 MED ORDER — LIDOCAINE HCL 1 % IJ SOLN
INTRAMUSCULAR | Status: AC
  Filled 2022-08-18: qty 60

## 2022-08-18 MED ORDER — SODIUM CHLORIDE 0.9% FLUSH: 3.0000 mL | Freq: Two times a day (BID) | INTRAVENOUS | Status: DC

## 2022-08-18 MED ORDER — MIDAZOLAM HCL 5 MG/5ML IJ SOLN
INTRAMUSCULAR | Status: AC
  Filled 2022-08-18: qty 5

## 2022-08-18 MED ORDER — SODIUM CHLORIDE 0.9 % IV SOLN: INTRAVENOUS | Status: DC | PRN

## 2022-08-18 SURGICAL SUPPLY — 4 items
CABLE SURGICAL S-101-97-12 (CABLE) ×1 IMPLANT
PACEMAKER ACCOLADE DR-EL (Pacemaker) IMPLANT
PAD DEFIB RADIO PHYSIO CONN (PAD) ×1 IMPLANT
TRAY PACEMAKER INSERTION (PACKS) ×1 IMPLANT

## 2022-08-18 NOTE — Progress Notes (Signed)
Assumed care of pt at this time.

## 2022-08-18 NOTE — Interval H&P Note (Signed)
History and Physical Interval Note:  08/18/2022 2:12 PM  George Wolf  has presented today for surgery, with the diagnosis of ERI.  The various methods of treatment have been discussed with the patient and family. After consideration of risks, benefits and other options for treatment, the patient has consented to  Procedure(s): PPM GENERATOR CHANGEOUT (N/A) as a surgical intervention.  The patient's history has been reviewed, patient examined, no change in status, stable for surgery.  I have reviewed the patient's chart and labs.  Questions were answered to the patient's satisfaction.      

## 2022-08-18 NOTE — Discharge Instructions (Signed)
Supplemental Discharge Instructions for  ?Pacemaker/Defibrillator Patients ? ?Activity ?No restrictions.  DO wear your seatbelt, even if it crosses over the pacemaker site. ? ?WOUND CARE ?Keep the wound area clean and dry.  Remove the dressing the day after you return home (usually 48 hours after the procedure). ?DO NOT SUBMERGE UNDER WATER UNTIL FULLY HEALED (no tub baths, hot tubs, swimming pools, etc.).  ?You  may shower or take a sponge bath after the dressing is removed. DO NOT SOAK the area and do not allow the shower to directly spray on the site. ?If you have tape/steri-strips on your wound, these will fall off; do not pull them off prematurely.   ?No bandage is needed on the site.  DO  NOT apply any creams, oils, or ointments to the wound area. ?If you notice any drainage or discharge from the wound, any swelling, excessive redness or bruising at the site, or if you develop a fever > 101? F after you are discharged home, call the office at once. ? ?Special Instructions ?You are still able to use cellular telephones.  Avoid carrying your cellular phone near your device. ?When traveling through airports, show security personnel your identification card to avoid being screened in the metal detectors.  ?Avoid arc welding equipment, MRI testing (magnetic resonance imaging), TENS units (transcutaneous nerve stimulators).  Call the office for questions about other devices. ?Avoid electrical appliances that are in poor condition or are not properly grounded. ?Microwave ovens are safe to be near or to operate. ? ? ?

## 2022-08-18 NOTE — Op Note (Signed)
Procedure report  Procedure performed:  Dual chamber pacemaker generator changeout   Reason for procedure:  1. Device generator under manufacturer advisory 2. Sick sinus syndrome  Procedure performed by:  Sanda Klein, MD  Complications:  None  Estimated blood loss:  <5 mL  Medications administered during procedure:  Vancomycin 1.5 g intravenously, lidocaine 1% 30 mL locally Device details:   [place] [employer] EL DR, model number [id number], serial number [id number] Right atrial lead (chronic) [employer] , model number [phone], serial number [id number] (implanted 12/05/2011) Right ventricular lead (chronic)  [employer], model number [id number], serial number [id number] (implanted 12/05/2011) Explanted generator [employer],  model number [id number], serial number  [id number] (implanted 12/05/2011)  Procedure details:  After the risks and benefits of the procedure were discussed the patient provided informed consent. She was brought to the cardiac catheter lab in the fasting state. The patient was prepped and draped in usual sterile fashion. Local anesthesia with 1% lidocaine was administered to to the left infraclavicular area. A 5-6cm horizontal incision was made parallel with and 2-3 cm caudal to the left clavicle, in the area of an old scar. An older scar was seen closer to the left clavicle. Using minimal electrocautery and mostly sharp and blunt dissection the prepectoral pocket was opened carefully to avoid injury to the loops of chronic leads. Extensive dissection was not necessary. The device was explanted. The pocket was carefully inspected for hemostasis and flushed with copious amounts of antibiotic solution.  The leads were disconnected from the old generator connected to the new generator, with appropriate pacing noted.  Telemetry testing of the lead parameters showed excellent values.   The entire system was then carefully inserted in the pocket with care been  taking that the leads and device assumed a comfortable position without pressure on the incision. Great care was taken that the leads be located deep to the generator. The pocket was then closed in layers using one layer of 2-0 Vicryl, two layers of 3-0 Vicryl  and cutaneous steristrips after which a sterile dressing was applied.   At the end of the procedure the following lead parameters were encountered:   Right atrial lead sensed P waves 3.2 mV, impedance 571 ohms, threshold 0.5 at 0.4 ms pulse width.  Right ventricular lead sensed R waves  7.7 mV, impedance 568 ohms, threshold 1.3 at 1.0 ms pulse width.  Sanda Klein, MD, [facility] [phone] office [phone] pager

## 2022-08-19 ENCOUNTER — Encounter (HOSPITAL_COMMUNITY): Payer: Self-pay | Admitting: Cardiovascular Disease

## 2022-08-19 MED FILL — Fentanyl Citrate Preservative Free (PF) Inj 100 MCG/2ML: INTRAMUSCULAR | Qty: 2 | Status: AC

## 2022-08-19 MED FILL — Midazolam HCl Inj 5 MG/5ML (Base Equivalent): INTRAMUSCULAR | Qty: 5 | Status: AC

## 2022-08-30 ENCOUNTER — Ambulatory Visit: Payer: PPO | Attending: Student | Admitting: Student

## 2022-08-30 ENCOUNTER — Encounter: Payer: Self-pay | Admitting: Student

## 2022-08-30 VITALS — Ht 73.0 in | Wt 231.2 lb

## 2022-08-30 DIAGNOSIS — I495 Sick sinus syndrome: Secondary | ICD-10-CM

## 2022-08-30 LAB — CUP PACEART INCLINIC DEVICE CHECK
Date Time Interrogation Session: 20240402082407
Implantable Lead Connection Status: 753985
Implantable Lead Connection Status: 753985
Implantable Lead Implant Date: 20130708
Implantable Lead Implant Date: 20130708
Implantable Lead Location: 753859
Implantable Lead Location: 753860
Implantable Lead Model: 4136
Implantable Lead Model: 4137
Implantable Lead Serial Number: 29021711
Implantable Lead Serial Number: 29201221
Implantable Pulse Generator Implant Date: 20240321
Pulse Gen Serial Number: 648055

## 2022-08-30 NOTE — Progress Notes (Signed)
Remote pacemaker transmission.   

## 2022-08-30 NOTE — Progress Notes (Signed)
Wound check appointment. Steri-strips removed. Wound without redness or edema. Incision edges approximated, wound well healed. Normal device function. Thresholds, sensing, and impedances consistent with implant and chronic measurements. Device programmed at appropriate chronic settings. Histogram distribution appropriate for patient and level of activity. No mode switches or high ventricular rates noted. Patient educated about wound care, arm mobility, lifting restrictions. ROV in 3 months with Dr. Sallyanne Kuster.

## 2022-08-31 ENCOUNTER — Ambulatory Visit: Payer: PPO

## 2022-09-07 DIAGNOSIS — C44321 Squamous cell carcinoma of skin of nose: Secondary | ICD-10-CM | POA: Diagnosis not present

## 2022-09-07 DIAGNOSIS — D2272 Melanocytic nevi of left lower limb, including hip: Secondary | ICD-10-CM | POA: Diagnosis not present

## 2022-09-07 DIAGNOSIS — D485 Neoplasm of uncertain behavior of skin: Secondary | ICD-10-CM | POA: Diagnosis not present

## 2022-09-07 DIAGNOSIS — D225 Melanocytic nevi of trunk: Secondary | ICD-10-CM | POA: Diagnosis not present

## 2022-09-07 DIAGNOSIS — D0439 Carcinoma in situ of skin of other parts of face: Secondary | ICD-10-CM | POA: Diagnosis not present

## 2022-09-07 DIAGNOSIS — L812 Freckles: Secondary | ICD-10-CM | POA: Diagnosis not present

## 2022-09-07 DIAGNOSIS — L821 Other seborrheic keratosis: Secondary | ICD-10-CM | POA: Diagnosis not present

## 2022-10-26 ENCOUNTER — Ambulatory Visit: Payer: PPO

## 2022-11-21 ENCOUNTER — Ambulatory Visit (INDEPENDENT_AMBULATORY_CARE_PROVIDER_SITE_OTHER): Payer: PPO

## 2022-11-21 DIAGNOSIS — I495 Sick sinus syndrome: Secondary | ICD-10-CM | POA: Diagnosis not present

## 2022-11-22 LAB — CUP PACEART REMOTE DEVICE CHECK
Battery Remaining Longevity: 162 mo
Battery Remaining Percentage: 100 %
Brady Statistic RA Percent Paced: 98 %
Brady Statistic RV Percent Paced: 12 %
Date Time Interrogation Session: 20240624014100
Implantable Lead Connection Status: 753985
Implantable Lead Connection Status: 753985
Implantable Lead Implant Date: 20130708
Implantable Lead Implant Date: 20130708
Implantable Lead Location: 753859
Implantable Lead Location: 753860
Implantable Lead Model: 4136
Implantable Lead Model: 4137
Implantable Lead Serial Number: 29021711
Implantable Lead Serial Number: 29201221
Implantable Pulse Generator Implant Date: 20240321
Lead Channel Impedance Value: 562 Ohm
Lead Channel Impedance Value: 567 Ohm
Lead Channel Pacing Threshold Amplitude: 0.3 V
Lead Channel Pacing Threshold Pulse Width: 0.4 ms
Lead Channel Setting Pacing Amplitude: 2 V
Lead Channel Setting Pacing Amplitude: 2.5 V
Lead Channel Setting Pacing Pulse Width: 1 ms
Lead Channel Setting Sensing Sensitivity: 2.5 mV
Pulse Gen Serial Number: 648055
Zone Setting Status: 755011

## 2022-12-08 ENCOUNTER — Encounter: Payer: Self-pay | Admitting: Cardiovascular Disease

## 2022-12-08 ENCOUNTER — Ambulatory Visit: Payer: PPO | Admitting: Cardiovascular Disease

## 2022-12-08 VITALS — BP 116/77 | HR 65 | Resp 18 | Ht 72.0 in | Wt 234.2 lb

## 2022-12-08 DIAGNOSIS — R7303 Prediabetes: Secondary | ICD-10-CM | POA: Diagnosis not present

## 2022-12-08 DIAGNOSIS — D6869 Other thrombophilia: Secondary | ICD-10-CM

## 2022-12-08 DIAGNOSIS — I495 Sick sinus syndrome: Secondary | ICD-10-CM | POA: Diagnosis not present

## 2022-12-08 DIAGNOSIS — I10 Essential (primary) hypertension: Secondary | ICD-10-CM

## 2022-12-08 DIAGNOSIS — I480 Paroxysmal atrial fibrillation: Secondary | ICD-10-CM | POA: Diagnosis not present

## 2022-12-08 DIAGNOSIS — Z950 Presence of cardiac pacemaker: Secondary | ICD-10-CM | POA: Diagnosis not present

## 2022-12-08 MED ORDER — RIVAROXABAN 20 MG PO TABS: 20.0000 mg | ORAL_TABLET | Freq: Every day | ORAL | 0 refills | Status: AC

## 2022-12-08 NOTE — Patient Instructions (Signed)
Medication Instructions:  No changes *If you need a refill on your cardiac medications before your next appointment, please call your pharmacy*  Follow-Up: At [facility], you and your health needs are our priority.  As part of our continuing mission to provide you with exceptional heart care, we have created designated Provider Care Teams.  These Care Teams include your primary Cardiologist (physician) and Advanced Practice Providers (APPs -  Physician Assistants and Nurse Practitioners) who all work together to provide you with the care you need, when you need it.  We recommend signing up for the patient portal called "MyChart".  Sign up information is provided on this After Visit Summary.  MyChart is used to connect with patients for Virtual Visits (Telemedicine).  Patients are able to view lab/test results, encounter notes, upcoming appointments, etc.  Non-urgent messages can be sent to your provider as well.   To learn more about what you can do with MyChart, go to [email or web address].    Your next appointment:   1 year(s)  Provider:   Mihai Croitoru, MD     

## 2022-12-08 NOTE — Progress Notes (Signed)
Cardiology Office Note    Date:  12/08/2022   ID:  George Wolf, George Wolf 02-16-1946, MRN [id number]  PCP:  George Fick, MD  Cardiologist:   George Fair, MD   Chief Complaint  Patient presents with   Pacemaker Check    History of Present Illness:  George Wolf is a 77 y.o. male with HTN, paroxysmal atrial fibrillation and symptomatic sinus node dysfunction who received a dual-chamber permanent pacemaker in 2013 and returns for follow-up. He does not have significant structural heart disease.   He has done well since his pacemaker generator change out.  The device site has healed very nicely.  He remains quite active for his age.  The patient specifically denies any chest pain at rest exertion, dyspnea at rest or with exertion, orthopnea, paroxysmal nocturnal dyspnea, syncope, palpitations, focal neurological deficits, intermittent claudication, lower extremity edema, unexplained weight gain, cough, hemoptysis or wheezing.  He has not had any bleeding on Xarelto anticoagulation.  His new generator is a Radiation protection practitioner with an estimated generator longevity of 11 years.  He has 97% atrial pacing and only 18% ventricular pacing.  Lead parameters are good.  He has not had any episodes of atrial fibrillation or high ventricular response.  His rate response settings are set up fairly aggressively since he is very active.  Lipid parameters are all in desirable range and he has good glycemic control.  His blood pressure is also in normal range on treatment with benazepril.  He is very proud of his 2 granddaughters.  One is a Chief Strategy Officer in college at [employer] in [place] and the other just graduated high school and is going to [employer] for Public relations account executive  Past Medical History:  Diagnosis Date   AF (paroxysmal atrial fibrillation) (HCC)    Arthritis    Cancer (HCC)    SKIN CANCER   Depression    Hypertension    Pacemaker 11/2011   [facility] for SSS    Sinus node dysfunction [facility])    [employer] pacemaker 12/05/11    Past Surgical History:  Procedure Laterality Date   FINGER SURGERY     LEFT THUMB   11/2016   INSERTION OF MESH N/A 05/31/2017   Procedure: INSERTION OF MESH;  Surgeon: Manus Rudd, MD;  Location: [facility] OR;  Service: General;  Laterality: N/A;   PERMANENT PACEMAKER INSERTION  12/05/2011   [employer]   PERMANENT PACEMAKER INSERTION N/A 12/05/2011   Procedure: PERMANENT PACEMAKER INSERTION;  Surgeon: George Fair, MD;  Location: [facility] CATH LAB;  Service: Cardiovascular;  Laterality: N/A;   PPM GENERATOR CHANGEOUT N/A 08/18/2022   Procedure: PPM GENERATOR CHANGEOUT;  Surgeon: George Fair, MD;  Location: [facility] INVASIVE CV LAB;  Service: Cardiovascular;  Laterality: N/A;   REPLACEMENT TOTAL KNEE  03/23/2009   right   TONSILLECTOMY     TOTAL KNEE ARTHROPLASTY Left 03/16/2020   Procedure: TOTAL KNEE ARTHROPLASTY;  Surgeon: Ollen Gross, MD;  Location: [facility] ORS;  Service: Orthopedics;  Laterality: Left;    UMBILICAL HERNIA REPAIR N/A 05/31/2017   Procedure: HERNIA REPAIR UMBILICAL ERAS PATHWAY;  Surgeon: Manus Rudd, MD;  Location: [facility] OR;  Service: General;  Laterality: N/A;  LMA ONLY   US ECHOCARDIOGRAPHY  11/01/2011   mildly dilated LA    Current Medications: Outpatient Medications Prior to Visit  Medication Sig Dispense Refill   allopurinol (ZYLOPRIM) 100 MG tablet Take 100 mg by mouth daily.  9   atorvastatin (LIPITOR) 20 MG tablet  Take 20 mg by mouth every evening.   2   benazepril (LOTENSIN) 20 MG tablet Take 20 mg by mouth daily.     buPROPion ER (WELLBUTRIN SR) 100 MG 12 hr tablet Take 100 mg by mouth in the morning and at bedtime.     cetirizine (ZYRTEC) 10 MG tablet Take 10 mg by mouth daily.     clonazePAM (KLONOPIN) 1 MG tablet Take 0.5 mg by mouth daily as needed for anxiety.     fluticasone (FLONASE) 50 MCG/ACT nasal spray Place 1 spray into both nostrils daily as needed for allergies.      hydrochlorothiazide (HYDRODIURIL) 25 MG tablet Take 25 mg by mouth daily.     ibuprofen (ADVIL) 200 MG tablet Take 400-800 mg by mouth daily as needed for moderate pain.     Magnesium 400 MG TABS Take 400 mg by mouth every evening.     rivaroxaban (XARELTO) 20 MG TABS tablet TAKE 1 TABLET(20 MG) BY MOUTH DAILY WITH DINNER 90 tablet 3   sertraline (ZOLOFT) 100 MG tablet Take 100 mg by mouth daily.      No facility-administered medications prior to visit.     Allergies:   Penicillins   Social History   Socioeconomic History   Marital status: Married    Spouse name: Not on file   Number of children: 2   Years of education: Not on file   Highest education level: Not on file  Occupational History   Not on file  Tobacco Use   Smoking status: Never   Smokeless tobacco: Never  Vaping Use   Vaping status: Never Used  Substance and Sexual Activity   Alcohol use: Yes    Comment: OCC WINE    Drug use: No   Sexual activity: Yes    Partners: Female  Other Topics Concern   Not on file  Social History Narrative   Not on file   Social Determinants of Health   Financial Resource Strain: Not on file  Food Insecurity: Not on file  Transportation Needs: Not on file  Physical Activity: Not on file  Stress: Not on file  Social Connections: Not on file     Family History:  The patient's family history includes Diabetes in his father.   ROS:   Please see the history of present illness.    ROS All other systems are reviewed and are negative.   PHYSICAL EXAM:   VS:  BP 116/77 (BP Location: Right Arm, Patient Position: Sitting, Cuff Size: Large)   Pulse 65   Resp 18   Ht 6' (1.829 m)   Wt 234 lb 3.2 oz (106.2 kg)   SpO2 95%   BMI 31.76 kg/m      General: Alert, oriented x3, no distress, well-healed pacemaker site left subclavian area Head: no evidence of trauma, PERRL, EOMI, no exophtalmos or lid lag, no myxedema, no xanthelasma; normal ears, nose and oropharynx Neck: normal  jugular venous pulsations and no hepatojugular reflux; brisk carotid pulses without delay and no carotid bruits Chest: clear to auscultation, no signs of consolidation by percussion or palpation, normal fremitus, symmetrical and full respiratory excursions Cardiovascular: normal position and quality of the apical impulse, regular rhythm, normal first and second heart sounds, no murmurs, rubs or gallops Abdomen: no tenderness or distention, no masses by palpation, no abnormal pulsatility or arterial bruits, normal bowel sounds, no hepatosplenomegaly Extremities: no clubbing, cyanosis or edema; 2+ radial, ulnar and brachial pulses bilaterally; 2+ right femoral, posterior  tibial and dorsalis pedis pulses; 2+ left femoral, posterior tibial and dorsalis pedis pulses; no subclavian or femoral bruits Neurological: grossly nonfocal Psych: Normal mood and affect      Wt Readings from Last 3 Encounters:  12/08/22 234 lb 3.2 oz (106.2 kg)  08/30/22 231 lb 3.2 oz (104.9 kg)  08/18/22 230 lb (104.3 kg)      Studies/Labs Reviewed:   EKG:  EKG is ordered today.  It shows atrial paced, ventricular sensed rhythm with prolonged AV delay 248 ms, QRS just under 100 ms.  No ischemic changes, QTc 467 ms.  BMET    Component Value Date/Time   NA 140 08/10/2022 0948   K 4.2 08/10/2022 0948   CL 102 08/10/2022 0948   CO2 27 08/10/2022 0948   GLUCOSE 101 (H) 08/10/2022 0948   GLUCOSE 133 (H) 03/17/2020 0335   BUN 24 08/10/2022 0948   CREATININE 0.92 08/10/2022 0948   CALCIUM 9.1 08/10/2022 0948   GFRNONAA >60 03/17/2020 0335   GFRAA >60 05/25/2017 1253   09/24/2020 Creatinine 0.97, TSH   Lipid Panel  No results found for: "CHOL", "TRIG", "HDL", "CHOLHDL", "VLDL", "LDLCALC", "LDLDIRECT", "LABVLDL"  09/24/2020 Cholesterol 140, HDL 55, LDL 80, TG 57  07/06/2022 Hemoglobin 13.4, hemoglobin A1c 6.0%, TSH 2.63, normal electrolytes, creatinine 0.9 Cholesterol 155, HDL 55, LDL 78, triglycerides  59.   ASSESSMENT:    1. Paroxysmal atrial fibrillation (HCC)   2. Primary hypertension   3. Acquired thrombophilia (HCC)   4. SSS (sick sinus syndrome) (HCC)   5. Pacemaker   6. Prediabetes      PLAN:  In order of problems listed above:  AFib: Episodes have been very infrequent and consistently asymptomatic.  CHA2DS2-VASc 3 (age 58, hypertension). Anticoagulation: No bleeding problems. SSS: Heart rate histogram distribution appears appropriate for his very active lifestyle. PPM: Generator change, normal device function. NSVT: These have been occasionally seen on his downloads but have never been symptomatic.  None seen since generator change out. HTN: Well-controlled. Obesity/prediabetes: Improved metabolic control since his last appointment.    Medication Adjustments/Labs and Tests Ordered: Current medicines are reviewed at length with the patient today.  Concerns regarding medicines are outlined above.  Medication changes, Labs and Tests ordered today are listed in the Patient Instructions below. Patient Instructions  Medication Instructions:  No changes *If you need a refill on your cardiac medications before your next appointment, please call your pharmacy*  Follow-Up: At [facility], you and your health needs are our priority.  As part of our continuing mission to provide you with exceptional heart care, we have created designated Provider Care Teams.  These Care Teams include your primary Cardiologist (physician) and Advanced Practice Providers (APPs -  Physician Assistants and Nurse Practitioners) who all work together to provide you with the care you need, when you need it.  We recommend signing up for the patient portal called "MyChart".  Sign up information is provided on this After Visit Summary.  MyChart is used to connect with patients for Virtual Visits (Telemedicine).  Patients are able to view lab/test results, encounter notes, upcoming appointments,  etc.  Non-urgent messages can be sent to your provider as well.   To learn more about what you can do with MyChart, go to [email or web address].    Your next appointment:   1 year(s)  Provider:   Thurmon Fair, MD       Signed, George Fair, MD  12/08/2022 3:46 PM    [facility] [address] [place], [place], [place]  [address] Phone: [phone]; Fax: [phone]

## 2022-12-09 NOTE — Progress Notes (Signed)
Remote pacemaker transmission.   

## 2023-01-18 DIAGNOSIS — I10 Essential (primary) hypertension: Secondary | ICD-10-CM | POA: Diagnosis not present

## 2023-01-18 DIAGNOSIS — E782 Mixed hyperlipidemia: Secondary | ICD-10-CM | POA: Diagnosis not present

## 2023-01-18 DIAGNOSIS — I700 Atherosclerosis of aorta: Secondary | ICD-10-CM | POA: Diagnosis not present

## 2023-01-18 DIAGNOSIS — I480 Paroxysmal atrial fibrillation: Secondary | ICD-10-CM | POA: Diagnosis not present

## 2023-01-18 DIAGNOSIS — R7303 Prediabetes: Secondary | ICD-10-CM | POA: Diagnosis not present

## 2023-01-18 DIAGNOSIS — D6869 Other thrombophilia: Secondary | ICD-10-CM | POA: Diagnosis not present

## 2023-01-25 ENCOUNTER — Ambulatory Visit: Payer: PPO

## 2023-01-25 DIAGNOSIS — E782 Mixed hyperlipidemia: Secondary | ICD-10-CM | POA: Diagnosis not present

## 2023-01-25 DIAGNOSIS — I10 Essential (primary) hypertension: Secondary | ICD-10-CM | POA: Diagnosis not present

## 2023-01-25 DIAGNOSIS — M109 Gout, unspecified: Secondary | ICD-10-CM | POA: Diagnosis not present

## 2023-01-25 DIAGNOSIS — F320 Major depressive disorder, single episode, mild: Secondary | ICD-10-CM | POA: Diagnosis not present

## 2023-01-25 DIAGNOSIS — D6869 Other thrombophilia: Secondary | ICD-10-CM | POA: Diagnosis not present

## 2023-01-25 DIAGNOSIS — I480 Paroxysmal atrial fibrillation: Secondary | ICD-10-CM | POA: Diagnosis not present

## 2023-01-25 DIAGNOSIS — I700 Atherosclerosis of aorta: Secondary | ICD-10-CM | POA: Diagnosis not present

## 2023-01-25 DIAGNOSIS — N182 Chronic kidney disease, stage 2 (mild): Secondary | ICD-10-CM | POA: Diagnosis not present

## 2023-01-25 DIAGNOSIS — D692 Other nonthrombocytopenic purpura: Secondary | ICD-10-CM | POA: Diagnosis not present

## 2023-01-25 DIAGNOSIS — I495 Sick sinus syndrome: Secondary | ICD-10-CM | POA: Diagnosis not present

## 2023-01-25 DIAGNOSIS — Z23 Encounter for immunization: Secondary | ICD-10-CM | POA: Diagnosis not present

## 2023-01-25 DIAGNOSIS — R7303 Prediabetes: Secondary | ICD-10-CM | POA: Diagnosis not present

## 2023-02-20 ENCOUNTER — Ambulatory Visit (INDEPENDENT_AMBULATORY_CARE_PROVIDER_SITE_OTHER): Payer: PPO

## 2023-02-20 DIAGNOSIS — I495 Sick sinus syndrome: Secondary | ICD-10-CM

## 2023-02-20 LAB — CUP PACEART REMOTE DEVICE CHECK
Battery Remaining Longevity: 156 mo
Battery Remaining Percentage: 100 %
Brady Statistic RA Percent Paced: 98 %
Brady Statistic RV Percent Paced: 14 %
Date Time Interrogation Session: 20240923014100
Implantable Lead Connection Status: 753985
Implantable Lead Connection Status: 753985
Implantable Lead Implant Date: 20130708
Implantable Lead Implant Date: 20130708
Implantable Lead Location: 753859
Implantable Lead Location: 753860
Implantable Lead Model: 4136
Implantable Lead Model: 4137
Implantable Lead Serial Number: 29021711
Implantable Lead Serial Number: 29201221
Implantable Pulse Generator Implant Date: 20240321
Lead Channel Impedance Value: 581 Ohm
Lead Channel Impedance Value: 609 Ohm
Lead Channel Setting Pacing Amplitude: 2 V
Lead Channel Setting Pacing Amplitude: 2.5 V
Lead Channel Setting Pacing Pulse Width: 1 ms
Lead Channel Setting Sensing Sensitivity: 2.5 mV
Pulse Gen Serial Number: 648055
Zone Setting Status: 755011

## 2023-03-03 NOTE — Progress Notes (Signed)
Remote pacemaker transmission.   

## 2023-03-06 DIAGNOSIS — L570 Actinic keratosis: Secondary | ICD-10-CM | POA: Diagnosis not present

## 2023-03-06 DIAGNOSIS — L820 Inflamed seborrheic keratosis: Secondary | ICD-10-CM | POA: Diagnosis not present

## 2023-03-06 DIAGNOSIS — L812 Freckles: Secondary | ICD-10-CM | POA: Diagnosis not present

## 2023-03-06 DIAGNOSIS — L821 Other seborrheic keratosis: Secondary | ICD-10-CM | POA: Diagnosis not present

## 2023-03-06 DIAGNOSIS — Z85828 Personal history of other malignant neoplasm of skin: Secondary | ICD-10-CM | POA: Diagnosis not present

## 2023-03-06 DIAGNOSIS — D1801 Hemangioma of skin and subcutaneous tissue: Secondary | ICD-10-CM | POA: Diagnosis not present

## 2023-03-06 DIAGNOSIS — D2272 Melanocytic nevi of left lower limb, including hip: Secondary | ICD-10-CM | POA: Diagnosis not present

## 2023-03-06 DIAGNOSIS — D225 Melanocytic nevi of trunk: Secondary | ICD-10-CM | POA: Diagnosis not present

## 2023-05-22 ENCOUNTER — Ambulatory Visit (INDEPENDENT_AMBULATORY_CARE_PROVIDER_SITE_OTHER): Payer: PPO

## 2023-05-22 DIAGNOSIS — I495 Sick sinus syndrome: Secondary | ICD-10-CM | POA: Diagnosis not present

## 2023-05-22 LAB — CUP PACEART REMOTE DEVICE CHECK
Battery Remaining Longevity: 156 mo
Battery Remaining Percentage: 100 %
Brady Statistic RA Percent Paced: 98 %
Brady Statistic RV Percent Paced: 15 %
Date Time Interrogation Session: 20241223020700
Implantable Lead Connection Status: 753985
Implantable Lead Connection Status: 753985
Implantable Lead Implant Date: 20130708
Implantable Lead Implant Date: 20130708
Implantable Lead Location: 753859
Implantable Lead Location: 753860
Implantable Lead Model: 4136
Implantable Lead Model: 4137
Implantable Lead Serial Number: 29021711
Implantable Lead Serial Number: 29201221
Implantable Pulse Generator Implant Date: 20240321
Lead Channel Impedance Value: 581 Ohm
Lead Channel Impedance Value: 595 Ohm
Lead Channel Pacing Threshold Amplitude: 0.4 V
Lead Channel Pacing Threshold Pulse Width: 0.4 ms
Lead Channel Setting Pacing Amplitude: 2 V
Lead Channel Setting Pacing Amplitude: 2.5 V
Lead Channel Setting Pacing Pulse Width: 1 ms
Lead Channel Setting Sensing Sensitivity: 2.5 mV
Pulse Gen Serial Number: 648055
Zone Setting Status: 755011

## 2023-06-21 ENCOUNTER — Encounter (HOSPITAL_BASED_OUTPATIENT_CLINIC_OR_DEPARTMENT_OTHER): Payer: Self-pay

## 2023-06-21 ENCOUNTER — Other Ambulatory Visit: Payer: Self-pay

## 2023-06-21 ENCOUNTER — Emergency Department (HOSPITAL_BASED_OUTPATIENT_CLINIC_OR_DEPARTMENT_OTHER)
Admission: EM | Admit: 2023-06-21 | Discharge: 2023-06-21 | Disposition: A | Discharge status: Home / Self Care | Payer: PPO | Attending: Emergency Medicine | Admitting: Emergency Medicine

## 2023-06-21 DIAGNOSIS — W268XXA Contact with other sharp object(s), not elsewhere classified, initial encounter: Secondary | ICD-10-CM | POA: Diagnosis not present

## 2023-06-21 DIAGNOSIS — S61213A Laceration without foreign body of left middle finger without damage to nail, initial encounter: Secondary | ICD-10-CM | POA: Diagnosis not present

## 2023-06-21 DIAGNOSIS — Z7901 Long term (current) use of anticoagulants: Secondary | ICD-10-CM | POA: Insufficient documentation

## 2023-06-21 NOTE — Discharge Instructions (Addendum)
Wear splint for the next 3 days.  Return if any problems.

## 2023-06-21 NOTE — ED Triage Notes (Signed)
Pt POV from home w/ laceration on the distal tip of his left middle finger. Pt states he was cutting things last night and accidentally caught his finger. Pt advises he did not come in sooner because he didn't think it was as bad as it is. Lac was wrapped upon arrival.

## 2023-06-21 NOTE — ED Provider Notes (Signed)
[place] EMERGENCY DEPARTMENT AT [facility] Provider Note   CSN: [id number] Arrival date & time: 06/21/23  0932     History  Chief Complaint  Patient presents with   Laceration    George Wolf is a 78 y.o. male.  The history is provided by the patient. No language interpreter was used.  Laceration Location:  Finger Finger laceration location:  L middle finger Length:  1 cm Depth:  Through dermis Quality: straight   Bleeding: controlled   Time since incident:  12 hours Laceration mechanism:  Metal edge Pain details:    Timing:  Constant   Progression:  Worsening Relieved by:  Nothing Worsened by:  Nothing Tetanus status:  Up to date Associated symptoms: no redness        Home Medications Prior to Admission medications   Medication Sig Start Date End Date Taking? Authorizing Provider  allopurinol (ZYLOPRIM) 100 MG tablet Take 100 mg by mouth daily. 11/30/14   [provider]  atorvastatin (LIPITOR) 20 MG tablet Take 20 mg by mouth every evening.  03/11/15   [provider]  benazepril (LOTENSIN) 20 MG tablet Take 20 mg by mouth daily.    [provider]  buPROPion ER (WELLBUTRIN SR) 100 MG 12 hr tablet Take 100 mg by mouth in the morning and at bedtime.    [provider]  cetirizine (ZYRTEC) 10 MG tablet Take 10 mg by mouth daily.    [provider]  clonazePAM (KLONOPIN) 1 MG tablet Take 0.5 mg by mouth daily as needed for anxiety.    [provider]  fluticasone (FLONASE) 50 MCG/ACT nasal spray Place 1 spray into both nostrils daily as needed for allergies.    [provider]  hydrochlorothiazide (HYDRODIURIL) 25 MG tablet Take 25 mg by mouth daily.    [provider]  ibuprofen (ADVIL) 200 MG tablet Take 400-800 mg by mouth daily as needed for moderate pain.    [provider]  Magnesium 400 MG TABS Take 400 mg by mouth every evening.    [provider]   rivaroxaban (XARELTO) 20 MG TABS tablet TAKE 1 TABLET(20 MG) BY MOUTH DAILY WITH DINNER 08/19/22   Croitoru, Mihai, MD  rivaroxaban (XARELTO) 20 MG TABS tablet Take 1 tablet (20 mg total) by mouth daily with supper. 12/08/22   Croitoru, Mihai, MD  sertraline (ZOLOFT) 100 MG tablet Take 100 mg by mouth daily.     [provider]      Allergies    Penicillins    Review of Systems   Review of Systems  All other systems reviewed and are negative.   Physical Exam Updated Vital Signs BP (!) 161/84 (BP Location: Right Arm)   Pulse (!) 59   Temp 97.7 F (36.5 C) (Oral)   Resp 16   Ht 6' (1.829 m)   Wt 104.3 kg   SpO2 94%   BMI 31.19 kg/m  Physical Exam Vitals and nursing note reviewed.  Constitutional:      Appearance: He is well-developed.  HENT:     Head: Normocephalic.  Cardiovascular:     Rate and Rhythm: Normal rate.  Pulmonary:     Effort: Pulmonary effort is normal.  Abdominal:     General: There is no distension.  Musculoskeletal:     Comments: 1cm round laceration distalleft 3rd finger,  from  nv and ns intact, good cap refill.    Skin:    General: Skin is warm.  Neurological:     General: No focal deficit present.     Mental Status: He is alert and oriented to person, place, and time.     ED Results / Procedures / Treatments   Labs (all labs ordered are listed, but only abnormal results are displayed) Labs Reviewed - No data to display  EKG None  Radiology No results found.  Procedures Procedures    Medications Ordered in ED Medications - No data to display  ED Course/ Medical Decision Making/ A&P                                 Medical Decision Making Pt cut tip of finger yesterday.   Risk OTC drugs. Risk Details: Wound is well-approximated, there is normal cap refill, no bleeding, normal range of motion of finger.  Patient counseled on laceration care.  Dermabonded skin edges.  Patient placed in a splint.  I advised him he should  wear the splint and allow Dermabond to stay on for the next 3 to 4 days.  He is advised to return to the emergency department if any problems.   Finger        Final Clinical Impression(s) / ED Diagnoses Final diagnoses:  Laceration of left middle finger, foreign body presence unspecified, nail damage status unspecified, initial encounter    Rx / DC Orders ED Discharge Orders     None     An After Visit Summary was printed and given to the patient.     Elson Areas, PA-C 06/21/23 1645    Virgina Norfolk, DO 06/24/23 [phone]

## 2023-06-21 NOTE — ED Notes (Signed)
Wrapped left middle finger with gauze and placed in splint

## 2023-06-28 NOTE — Progress Notes (Signed)
Remote pacemaker transmission.

## 2023-06-29 ENCOUNTER — Telehealth: Payer: Self-pay

## 2023-06-29 NOTE — Progress Notes (Signed)
Transition Care Management Unsuccessful Follow-up Telephone Call  Date of discharge and from where:  [facility] 1/22   Attempts:  1st Attempt  Reason for unsuccessful TCM follow-up call:  No answer/busy   Lenard Forth [facility] Guide, Phone: [phone] Fax: [phone] Website: [email or web address]

## 2023-06-30 ENCOUNTER — Telehealth: Payer: Self-pay

## 2023-06-30 NOTE — Progress Notes (Signed)
Transition Care Management Unsuccessful Follow-up Telephone Call  Date of discharge and from where:  [facility] 1/22  Attempts:  2nd Attempt  Reason for unsuccessful TCM follow-up call:  No answer/busy   Lenard Forth [facility] Guide, Phone: [phone] Fax: [phone] Website: [email or web address]

## 2023-07-13 DIAGNOSIS — Z0000 Encounter for general adult medical examination without abnormal findings: Secondary | ICD-10-CM | POA: Diagnosis not present

## 2023-07-13 DIAGNOSIS — I10 Essential (primary) hypertension: Secondary | ICD-10-CM | POA: Diagnosis not present

## 2023-07-13 DIAGNOSIS — I480 Paroxysmal atrial fibrillation: Secondary | ICD-10-CM | POA: Diagnosis not present

## 2023-07-13 DIAGNOSIS — E782 Mixed hyperlipidemia: Secondary | ICD-10-CM | POA: Diagnosis not present

## 2023-07-13 DIAGNOSIS — I700 Atherosclerosis of aorta: Secondary | ICD-10-CM | POA: Diagnosis not present

## 2023-07-13 DIAGNOSIS — D692 Other nonthrombocytopenic purpura: Secondary | ICD-10-CM | POA: Diagnosis not present

## 2023-07-13 DIAGNOSIS — I495 Sick sinus syndrome: Secondary | ICD-10-CM | POA: Diagnosis not present

## 2023-07-13 DIAGNOSIS — R5383 Other fatigue: Secondary | ICD-10-CM | POA: Diagnosis not present

## 2023-07-13 DIAGNOSIS — M109 Gout, unspecified: Secondary | ICD-10-CM | POA: Diagnosis not present

## 2023-07-13 DIAGNOSIS — R7303 Prediabetes: Secondary | ICD-10-CM | POA: Diagnosis not present

## 2023-07-13 DIAGNOSIS — N182 Chronic kidney disease, stage 2 (mild): Secondary | ICD-10-CM | POA: Diagnosis not present

## 2023-08-02 DIAGNOSIS — D23122 Other benign neoplasm of skin of left lower eyelid, including canthus: Secondary | ICD-10-CM | POA: Diagnosis not present

## 2023-08-02 DIAGNOSIS — Z961 Presence of intraocular lens: Secondary | ICD-10-CM | POA: Diagnosis not present

## 2023-08-03 DIAGNOSIS — I495 Sick sinus syndrome: Secondary | ICD-10-CM | POA: Diagnosis not present

## 2023-08-03 DIAGNOSIS — F320 Major depressive disorder, single episode, mild: Secondary | ICD-10-CM | POA: Diagnosis not present

## 2023-08-03 DIAGNOSIS — D6869 Other thrombophilia: Secondary | ICD-10-CM | POA: Diagnosis not present

## 2023-08-03 DIAGNOSIS — M109 Gout, unspecified: Secondary | ICD-10-CM | POA: Diagnosis not present

## 2023-08-03 DIAGNOSIS — Z0000 Encounter for general adult medical examination without abnormal findings: Secondary | ICD-10-CM | POA: Diagnosis not present

## 2023-08-03 DIAGNOSIS — M150 Primary generalized (osteo)arthritis: Secondary | ICD-10-CM | POA: Diagnosis not present

## 2023-08-03 DIAGNOSIS — I10 Essential (primary) hypertension: Secondary | ICD-10-CM | POA: Diagnosis not present

## 2023-08-03 DIAGNOSIS — E782 Mixed hyperlipidemia: Secondary | ICD-10-CM | POA: Diagnosis not present

## 2023-08-03 DIAGNOSIS — D692 Other nonthrombocytopenic purpura: Secondary | ICD-10-CM | POA: Diagnosis not present

## 2023-08-03 DIAGNOSIS — I480 Paroxysmal atrial fibrillation: Secondary | ICD-10-CM | POA: Diagnosis not present

## 2023-08-03 DIAGNOSIS — N182 Chronic kidney disease, stage 2 (mild): Secondary | ICD-10-CM | POA: Diagnosis not present

## 2023-08-21 ENCOUNTER — Ambulatory Visit (INDEPENDENT_AMBULATORY_CARE_PROVIDER_SITE_OTHER): Payer: PPO

## 2023-08-21 DIAGNOSIS — I495 Sick sinus syndrome: Secondary | ICD-10-CM

## 2023-08-22 ENCOUNTER — Encounter: Payer: Self-pay | Admitting: Cardiovascular Disease

## 2023-08-22 LAB — CUP PACEART REMOTE DEVICE CHECK
Battery Remaining Longevity: 150 mo
Battery Remaining Percentage: 100 %
Brady Statistic RA Percent Paced: 98 %
Brady Statistic RV Percent Paced: 15 %
Date Time Interrogation Session: 20250324014100
Implantable Lead Connection Status: 753985
Implantable Lead Connection Status: 753985
Implantable Lead Implant Date: 20130708
Implantable Lead Implant Date: 20130708
Implantable Lead Location: 753859
Implantable Lead Location: 753860
Implantable Lead Model: 4136
Implantable Lead Model: 4137
Implantable Lead Serial Number: 29021711
Implantable Lead Serial Number: 29201221
Implantable Pulse Generator Implant Date: 20240321
Lead Channel Impedance Value: 579 Ohm
Lead Channel Impedance Value: 595 Ohm
Lead Channel Pacing Threshold Amplitude: 0.4 V
Lead Channel Pacing Threshold Pulse Width: 0.4 ms
Lead Channel Setting Pacing Amplitude: 2 V
Lead Channel Setting Pacing Amplitude: 2.5 V
Lead Channel Setting Pacing Pulse Width: 1 ms
Lead Channel Setting Sensing Sensitivity: 2.5 mV
Pulse Gen Serial Number: 648055
Zone Setting Status: 755011

## 2023-10-06 NOTE — Addendum Note (Signed)
 Addended by: Edra Govern D on: 10/06/2023 01:35 PM   Modules accepted: Orders

## 2023-10-06 NOTE — Progress Notes (Signed)
 Remote pacemaker transmission.

## 2023-11-10 ENCOUNTER — Encounter: Payer: Self-pay | Admitting: Cardiovascular Disease

## 2023-11-20 ENCOUNTER — Ambulatory Visit (INDEPENDENT_AMBULATORY_CARE_PROVIDER_SITE_OTHER): Payer: PPO

## 2023-11-20 DIAGNOSIS — I495 Sick sinus syndrome: Secondary | ICD-10-CM | POA: Diagnosis not present

## 2023-11-21 LAB — CUP PACEART REMOTE DEVICE CHECK
Battery Remaining Longevity: 150 mo
Battery Remaining Percentage: 100 %
Brady Statistic RA Percent Paced: 98 %
Brady Statistic RV Percent Paced: 15 %
Date Time Interrogation Session: 20250623014000
Implantable Lead Connection Status: 753985
Implantable Lead Connection Status: 753985
Implantable Lead Implant Date: 20130708
Implantable Lead Implant Date: 20130708
Implantable Lead Location: 753859
Implantable Lead Location: 753860
Implantable Lead Model: 4136
Implantable Lead Model: 4137
Implantable Lead Serial Number: 29021711
Implantable Lead Serial Number: 29201221
Implantable Pulse Generator Implant Date: 20240321
Lead Channel Impedance Value: 576 Ohm
Lead Channel Impedance Value: 600 Ohm
Lead Channel Pacing Threshold Amplitude: 0.4 V
Lead Channel Pacing Threshold Pulse Width: 0.4 ms
Lead Channel Setting Pacing Amplitude: 2 V
Lead Channel Setting Pacing Amplitude: 2.5 V
Lead Channel Setting Pacing Pulse Width: 1 ms
Lead Channel Setting Sensing Sensitivity: 2.5 mV
Pulse Gen Serial Number: 648055
Zone Setting Status: 755011

## 2023-11-27 ENCOUNTER — Ambulatory Visit: Payer: Self-pay | Admitting: Cardiovascular Disease

## 2023-12-25 NOTE — Progress Notes (Signed)
 Remote pacemaker transmission.

## 2023-12-25 NOTE — Addendum Note (Signed)
 Addended by: TAWNI DRILLING D on: 12/25/2023 11:33 AM   Modules accepted: Orders

## 2024-01-04 DIAGNOSIS — L570 Actinic keratosis: Secondary | ICD-10-CM | POA: Diagnosis not present

## 2024-01-04 DIAGNOSIS — C44311 Basal cell carcinoma of skin of nose: Secondary | ICD-10-CM | POA: Diagnosis not present

## 2024-01-04 DIAGNOSIS — D485 Neoplasm of uncertain behavior of skin: Secondary | ICD-10-CM | POA: Diagnosis not present

## 2024-02-01 DIAGNOSIS — N182 Chronic kidney disease, stage 2 (mild): Secondary | ICD-10-CM | POA: Diagnosis not present

## 2024-02-01 DIAGNOSIS — R7303 Prediabetes: Secondary | ICD-10-CM | POA: Diagnosis not present

## 2024-02-01 DIAGNOSIS — I10 Essential (primary) hypertension: Secondary | ICD-10-CM | POA: Diagnosis not present

## 2024-02-01 DIAGNOSIS — E782 Mixed hyperlipidemia: Secondary | ICD-10-CM | POA: Diagnosis not present

## 2024-02-08 DIAGNOSIS — F320 Major depressive disorder, single episode, mild: Secondary | ICD-10-CM | POA: Diagnosis not present

## 2024-02-08 DIAGNOSIS — E782 Mixed hyperlipidemia: Secondary | ICD-10-CM | POA: Diagnosis not present

## 2024-02-08 DIAGNOSIS — Z23 Encounter for immunization: Secondary | ICD-10-CM | POA: Diagnosis not present

## 2024-02-08 DIAGNOSIS — D6869 Other thrombophilia: Secondary | ICD-10-CM | POA: Diagnosis not present

## 2024-02-08 DIAGNOSIS — I10 Essential (primary) hypertension: Secondary | ICD-10-CM | POA: Diagnosis not present

## 2024-02-08 DIAGNOSIS — I495 Sick sinus syndrome: Secondary | ICD-10-CM | POA: Diagnosis not present

## 2024-02-08 DIAGNOSIS — N182 Chronic kidney disease, stage 2 (mild): Secondary | ICD-10-CM | POA: Diagnosis not present

## 2024-02-08 DIAGNOSIS — I480 Paroxysmal atrial fibrillation: Secondary | ICD-10-CM | POA: Diagnosis not present

## 2024-02-08 DIAGNOSIS — R7303 Prediabetes: Secondary | ICD-10-CM | POA: Diagnosis not present

## 2024-02-08 DIAGNOSIS — D692 Other nonthrombocytopenic purpura: Secondary | ICD-10-CM | POA: Diagnosis not present

## 2024-02-08 DIAGNOSIS — M109 Gout, unspecified: Secondary | ICD-10-CM | POA: Diagnosis not present

## 2024-02-08 DIAGNOSIS — M150 Primary generalized (osteo)arthritis: Secondary | ICD-10-CM | POA: Diagnosis not present

## 2024-02-12 DIAGNOSIS — C44311 Basal cell carcinoma of skin of nose: Secondary | ICD-10-CM | POA: Diagnosis not present

## 2024-02-12 DIAGNOSIS — Z85828 Personal history of other malignant neoplasm of skin: Secondary | ICD-10-CM | POA: Diagnosis not present

## 2024-02-19 ENCOUNTER — Ambulatory Visit (INDEPENDENT_AMBULATORY_CARE_PROVIDER_SITE_OTHER): Payer: PPO

## 2024-02-19 DIAGNOSIS — I495 Sick sinus syndrome: Secondary | ICD-10-CM | POA: Diagnosis not present

## 2024-02-20 LAB — CUP PACEART REMOTE DEVICE CHECK
Battery Remaining Longevity: 150 mo
Battery Remaining Percentage: 100 %
Brady Statistic RA Percent Paced: 98 %
Brady Statistic RV Percent Paced: 15 %
Date Time Interrogation Session: 20250922014200
Implantable Lead Connection Status: 753985
Implantable Lead Connection Status: 753985
Implantable Lead Implant Date: 20130708
Implantable Lead Implant Date: 20130708
Implantable Lead Location: 753859
Implantable Lead Location: 753860
Implantable Lead Model: 4136
Implantable Lead Model: 4137
Implantable Lead Serial Number: 29021711
Implantable Lead Serial Number: 29201221
Implantable Pulse Generator Implant Date: 20240321
Lead Channel Impedance Value: 570 Ohm
Lead Channel Impedance Value: 587 Ohm
Lead Channel Pacing Threshold Amplitude: 0.4 V
Lead Channel Pacing Threshold Pulse Width: 0.4 ms
Lead Channel Setting Pacing Amplitude: 2 V
Lead Channel Setting Pacing Amplitude: 2.5 V
Lead Channel Setting Pacing Pulse Width: 1 ms
Lead Channel Setting Sensing Sensitivity: 2.5 mV
Pulse Gen Serial Number: 648055
Zone Setting Status: 755011

## 2024-02-20 NOTE — Progress Notes (Signed)
 Remote PPM Transmission

## 2024-02-24 ENCOUNTER — Ambulatory Visit: Payer: Self-pay | Admitting: Cardiovascular Disease

## 2024-03-20 DIAGNOSIS — Z85828 Personal history of other malignant neoplasm of skin: Secondary | ICD-10-CM | POA: Diagnosis not present

## 2024-03-20 DIAGNOSIS — D692 Other nonthrombocytopenic purpura: Secondary | ICD-10-CM | POA: Diagnosis not present

## 2024-03-20 DIAGNOSIS — L821 Other seborrheic keratosis: Secondary | ICD-10-CM | POA: Diagnosis not present

## 2024-03-20 DIAGNOSIS — D225 Melanocytic nevi of trunk: Secondary | ICD-10-CM | POA: Diagnosis not present

## 2024-03-20 DIAGNOSIS — L812 Freckles: Secondary | ICD-10-CM | POA: Diagnosis not present

## 2024-05-20 ENCOUNTER — Ambulatory Visit: Payer: PPO

## 2024-05-20 DIAGNOSIS — I495 Sick sinus syndrome: Secondary | ICD-10-CM

## 2024-05-21 LAB — CUP PACEART REMOTE DEVICE CHECK
Battery Remaining Longevity: 144 mo
Battery Remaining Percentage: 100 %
Brady Statistic RA Percent Paced: 98 %
Brady Statistic RV Percent Paced: 15 %
Date Time Interrogation Session: 20251222014100
Implantable Lead Connection Status: 753985
Implantable Lead Connection Status: 753985
Implantable Lead Implant Date: 20130708
Implantable Lead Implant Date: 20130708
Implantable Lead Location: 753859
Implantable Lead Location: 753860
Implantable Lead Model: 4136
Implantable Lead Model: 4137
Implantable Lead Serial Number: 29021711
Implantable Lead Serial Number: 29201221
Implantable Pulse Generator Implant Date: 20240321
Lead Channel Impedance Value: 588 Ohm
Lead Channel Impedance Value: 602 Ohm
Lead Channel Pacing Threshold Amplitude: 0.4 V
Lead Channel Pacing Threshold Pulse Width: 0.4 ms
Lead Channel Setting Pacing Amplitude: 2 V
Lead Channel Setting Pacing Amplitude: 2.5 V
Lead Channel Setting Pacing Pulse Width: 1 ms
Lead Channel Setting Sensing Sensitivity: 2.5 mV
Pulse Gen Serial Number: 648055
Zone Setting Status: 755011

## 2024-05-22 NOTE — Progress Notes (Signed)
 Remote PPM Transmission

## 2024-05-25 ENCOUNTER — Ambulatory Visit: Payer: Self-pay | Admitting: Cardiovascular Disease

## 2024-06-06 ENCOUNTER — Ambulatory Visit: Attending: Student | Admitting: Student

## 2024-06-06 ENCOUNTER — Encounter: Payer: Self-pay | Admitting: Student

## 2024-06-06 VITALS — BP 120/74 | HR 62 | Ht 72.0 in | Wt 245.0 lb

## 2024-06-06 DIAGNOSIS — I10 Essential (primary) hypertension: Secondary | ICD-10-CM

## 2024-06-06 DIAGNOSIS — I495 Sick sinus syndrome: Secondary | ICD-10-CM

## 2024-06-06 DIAGNOSIS — I480 Paroxysmal atrial fibrillation: Secondary | ICD-10-CM | POA: Diagnosis not present

## 2024-06-06 LAB — CBC
Hematocrit: 40.6 % (ref 37.5–51.0)
Hemoglobin: 14 g/dL (ref 13.0–17.7)
MCH: 32.7 pg (ref 26.6–33.0)
MCHC: 34.5 g/dL (ref 31.5–35.7)
MCV: 95 fL (ref 79–97)
Platelets: 168 x10E3/uL (ref 150–450)
RBC: 4.28 x10E6/uL (ref 4.14–5.80)
RDW: 12.8 % (ref 11.6–15.4)
WBC: 6.3 x10E3/uL (ref 3.4–10.8)

## 2024-06-06 LAB — BASIC METABOLIC PANEL WITH GFR
BUN/Creatinine Ratio: 34 — ABNORMAL HIGH (ref 10–24)
BUN: 37 mg/dL — ABNORMAL HIGH (ref 8–27)
CO2: 25 mmol/L (ref 20–29)
Calcium: 9.4 mg/dL (ref 8.6–10.2)
Chloride: 98 mmol/L (ref 96–106)
Creatinine, Ser: 1.08 mg/dL (ref 0.76–1.27)
Glucose: 101 mg/dL — ABNORMAL HIGH (ref 70–99)
Potassium: 4.6 mmol/L (ref 3.5–5.2)
Sodium: 139 mmol/L (ref 134–144)
eGFR: 69 mL/min/1.73

## 2024-06-06 LAB — CUP PACEART INCLINIC DEVICE CHECK
Date Time Interrogation Session: 20260108115628
Implantable Lead Connection Status: 753985
Implantable Lead Connection Status: 753985
Implantable Lead Implant Date: 20130708
Implantable Lead Implant Date: 20130708
Implantable Lead Location: 753859
Implantable Lead Location: 753860
Implantable Lead Model: 4136
Implantable Lead Model: 4137
Implantable Lead Serial Number: 29021711
Implantable Lead Serial Number: 29201221
Implantable Pulse Generator Implant Date: 20240321
Lead Channel Impedance Value: 601 Ohm
Lead Channel Impedance Value: 608 Ohm
Lead Channel Pacing Threshold Amplitude: 0.5 V
Lead Channel Pacing Threshold Amplitude: 1.6 V
Lead Channel Pacing Threshold Pulse Width: 0.4 ms
Lead Channel Pacing Threshold Pulse Width: 1 ms
Lead Channel Sensing Intrinsic Amplitude: 12.5 mV
Lead Channel Sensing Intrinsic Amplitude: 3 mV
Lead Channel Setting Pacing Amplitude: 2 V
Lead Channel Setting Pacing Amplitude: 3 V
Lead Channel Setting Pacing Pulse Width: 1 ms
Lead Channel Setting Sensing Sensitivity: 2.5 mV
Pulse Gen Serial Number: 648055
Zone Setting Status: 755011

## 2024-06-06 NOTE — Patient Instructions (Signed)
 Medication Instructions:  No medication changes today. *If you need a refill on your cardiac medications before your next appointment, please call your pharmacy*  Lab Work: BMET and CBC today If you have labs (blood work) drawn today and your tests are completely normal, you will receive your results only by: MyChart Message (if you have MyChart) OR A paper copy in the mail If you have any lab test that is abnormal or we need to change your treatment, we will call you to review the results.  Testing/Procedures: No testing ordered today  Follow-Up: At [facility], you and your health needs are our priority.  As part of our continuing mission to provide you with exceptional heart care, our providers are all part of one team.  This team includes your primary Cardiologist (physician) and Advanced Practice Providers or APPs (Physician Assistants and Nurse Practitioners) who all work together to provide you with the care you need, when you need it.  Your next appointment:   12 month(s)  Provider:   Jerel Balding, MD   We recommend signing up for the patient portal called MyChart.  Sign up information is provided on this After Visit Summary.  MyChart is used to connect with patients for Virtual Visits (Telemedicine).  Patients are able to view lab/test results, encounter notes, upcoming appointments, etc.  Non-urgent messages can be sent to your provider as well.   To learn more about what you can do with MyChart, go to [email or web address].

## 2024-06-06 NOTE — Progress Notes (Signed)
" °  Electrophysiology Office Note:   ID:  George Wolf, George Wolf 12/31/1945, MRN [id number]  Primary Cardiologist: Jerel Balding, MD Electrophysiologist: None      History of Present Illness:   DOCTOR SHEAHAN is a 79 y.o. male with h/o HTN, paroxysmal atrial fibrillation and symptomatic sinus node dysfunction seen today for routine electrophysiology followup.   Since last being seen in our clinic the patient reports doing well. Overall, he denies chest pain, palpitations, dyspnea, PND, orthopnea, nausea, vomiting, dizziness, syncope, edema, weight gain, or early satiety.   Review of systems complete and found to be negative unless listed in HPI.   EP Information / Studies Reviewed:    EKG is ordered today. Personal review as below.  EKG Interpretation Date/Time:  Thursday June 06 2024 11:44:00 EST Ventricular Rate:  72 PR Interval:  202 QRS Duration:  92 QT Interval:  416 QTC Calculation: 455 R Axis:   11  Text Interpretation: Atrial-paced rhythm Normal sinus rhythm Confirmed by Lesia Heck ([id number]) on 06/06/2024 11:46:44 AM    PPM Interrogation-  reviewed in detail today,  See PACEART report.  Arrhythmia/Device History Field Seismologist PPM implanted 07/2022 for SSS   Physical Exam:   VS:  BP 120/74   Pulse 62   Ht 6' (1.829 m)   Wt 245 lb (111.1 kg)   SpO2 97%   BMI 33.23 kg/m    Wt Readings from Last 3 Encounters:  06/06/24 245 lb (111.1 kg)  06/21/23 230 lb (104.3 kg)  12/08/22 234 lb 3.2 oz (106.2 kg)     GEN: No acute distress  NECK: No JVD; No carotid bruits CARDIAC: Regular rate and rhythm, no murmurs, rubs, gallops RESPIRATORY:  Clear to auscultation without rales, wheezing or rhonchi  ABDOMEN: Soft, non-tender, non-distended EXTREMITIES:  No edema; No deformity   ASSESSMENT AND PLAN:    Sick sinus syndrome s/p [employer] PPM  Normal PPM function See Elisabeth Art report Chronically elevated RV threshold. Amplitude adjusted to 3.0V @  1.33ms  Paroxysmal AF EKG today shows NSR 0% burden by device.  Continue eliquis 5 mg BID for CHA2DS2VASc of at least 3 Surveillance labs today.    HTN Stable on current regimen     Disposition:   Follow up with Dr. Balding in 12 months.  Signed, Ozell Prentice Lesia, PA-C  "

## 2024-06-07 ENCOUNTER — Ambulatory Visit: Payer: Self-pay | Admitting: Student

## 2024-11-18 ENCOUNTER — Encounter

## 2025-02-17 ENCOUNTER — Encounter

## 2025-05-19 ENCOUNTER — Encounter

## 2025-08-18 ENCOUNTER — Encounter

## 2025-11-17 ENCOUNTER — Encounter
